# Patient Record
Sex: Male | Born: 1935 | Race: White | Hispanic: No | Marital: Married | Smoking: Current every day smoker
Health system: Southern US, Community
[De-identification: ages and names within clinical notes are randomized; demographics above are authoritative.]

## PROBLEM LIST (undated history)

## (undated) DIAGNOSIS — I82409 Acute embolism and thrombosis of unspecified deep veins of unspecified lower extremity: Secondary | ICD-10-CM

## (undated) DIAGNOSIS — I2119 ST elevation (STEMI) myocardial infarction involving other coronary artery of inferior wall: Secondary | ICD-10-CM

## (undated) DIAGNOSIS — I5030 Unspecified diastolic (congestive) heart failure: Secondary | ICD-10-CM

## (undated) DIAGNOSIS — D126 Benign neoplasm of colon, unspecified: Secondary | ICD-10-CM

## (undated) DIAGNOSIS — E119 Type 2 diabetes mellitus without complications: Secondary | ICD-10-CM

## (undated) DIAGNOSIS — Z720 Tobacco use: Secondary | ICD-10-CM

## (undated) DIAGNOSIS — N183 Chronic kidney disease, stage 3 unspecified: Secondary | ICD-10-CM

## (undated) DIAGNOSIS — I10 Essential (primary) hypertension: Secondary | ICD-10-CM

## (undated) DIAGNOSIS — IMO0002 Reserved for concepts with insufficient information to code with codable children: Secondary | ICD-10-CM

## (undated) DIAGNOSIS — R739 Hyperglycemia, unspecified: Secondary | ICD-10-CM

## (undated) DIAGNOSIS — F1920 Other psychoactive substance dependence, uncomplicated: Secondary | ICD-10-CM

## (undated) DIAGNOSIS — I442 Atrioventricular block, complete: Secondary | ICD-10-CM

## (undated) DIAGNOSIS — M1990 Unspecified osteoarthritis, unspecified site: Secondary | ICD-10-CM

## (undated) DIAGNOSIS — E785 Hyperlipidemia, unspecified: Secondary | ICD-10-CM

## (undated) DIAGNOSIS — J449 Chronic obstructive pulmonary disease, unspecified: Secondary | ICD-10-CM

## (undated) HISTORY — DX: Hyperglycemia, unspecified: R73.9

## (undated) HISTORY — DX: Other psychoactive substance dependence, uncomplicated: F19.20

## (undated) HISTORY — DX: Reserved for concepts with insufficient information to code with codable children: IMO0002

## (undated) HISTORY — DX: Chronic kidney disease, stage 3 (moderate): N18.3

## (undated) HISTORY — DX: Chronic kidney disease, stage 3 unspecified: N18.30

## (undated) HISTORY — DX: Tobacco use: Z72.0

## (undated) HISTORY — DX: Unspecified diastolic (congestive) heart failure: I50.30

## (undated) HISTORY — PX: OTHER SURGICAL HISTORY: SHX169

## (undated) HISTORY — DX: Essential (primary) hypertension: I10

## (undated) HISTORY — DX: Atrioventricular block, complete: I44.2

## (undated) HISTORY — PX: MELANOMA EXCISION: SHX5266

## (undated) HISTORY — DX: Benign neoplasm of colon, unspecified: D12.6

## (undated) HISTORY — DX: Unspecified osteoarthritis, unspecified site: M19.90

## (undated) HISTORY — DX: Hyperlipidemia, unspecified: E78.5

## (undated) HISTORY — DX: Type 2 diabetes mellitus without complications: E11.9

## (undated) HISTORY — DX: ST elevation (STEMI) myocardial infarction involving other coronary artery of inferior wall: I21.19

---

## 1998-06-27 ENCOUNTER — Observation Stay (HOSPITAL_COMMUNITY): Admission: RE | Admit: 1998-06-27 | Discharge: 1998-06-28 | Payer: Self-pay | Admitting: *Deleted

## 2000-01-14 ENCOUNTER — Encounter: Admission: RE | Admit: 2000-01-14 | Discharge: 2000-01-14 | Payer: Self-pay | Admitting: *Deleted

## 2000-01-14 ENCOUNTER — Encounter: Payer: Self-pay | Admitting: *Deleted

## 2002-01-18 DIAGNOSIS — D126 Benign neoplasm of colon, unspecified: Secondary | ICD-10-CM

## 2002-01-18 HISTORY — DX: Benign neoplasm of colon, unspecified: D12.6

## 2002-09-28 ENCOUNTER — Ambulatory Visit (HOSPITAL_COMMUNITY): Admission: RE | Admit: 2002-09-28 | Discharge: 2002-09-28 | Payer: Self-pay | Admitting: Cardiovascular Disease

## 2003-11-08 ENCOUNTER — Emergency Department (HOSPITAL_COMMUNITY): Admission: EM | Admit: 2003-11-08 | Discharge: 2003-11-08 | Payer: Self-pay | Admitting: Emergency Medicine

## 2005-02-08 ENCOUNTER — Ambulatory Visit: Payer: Self-pay | Admitting: Gastroenterology

## 2005-03-10 ENCOUNTER — Ambulatory Visit: Payer: Self-pay | Admitting: Gastroenterology

## 2005-03-10 ENCOUNTER — Encounter (INDEPENDENT_AMBULATORY_CARE_PROVIDER_SITE_OTHER): Payer: Self-pay | Admitting: Specialist

## 2005-10-04 ENCOUNTER — Encounter: Admission: RE | Admit: 2005-10-04 | Discharge: 2005-10-04 | Payer: Self-pay | Admitting: Internal Medicine

## 2007-03-20 ENCOUNTER — Ambulatory Visit (HOSPITAL_BASED_OUTPATIENT_CLINIC_OR_DEPARTMENT_OTHER): Admission: RE | Admit: 2007-03-20 | Discharge: 2007-03-20 | Payer: Self-pay | Admitting: Specialist

## 2010-02-12 ENCOUNTER — Encounter: Payer: Self-pay | Admitting: Gastroenterology

## 2010-02-19 NOTE — Letter (Signed)
Summary: Colonoscopy Letter  [facility]  [address] [place], [place] [address]   Phone: [phone]  Fax: [phone]      February 12, 2010 MRN: [id number]   Lance Smith [address] [place], [place]  [address]   Dear [name],   According to your medical record, it is time for you to schedule a Colonoscopy. The American Cancer Society recommends this procedure as a method to detect early colon cancer. Patients with a family history of colon cancer, or a personal history of colon polyps or inflammatory bowel disease are at increased risk.  This letter has been generated based on the recommendations made at the time of your procedure. If you feel that in your particular situation this may no longer apply, please contact our office.  Please call our office at [phone] to schedule this appointment or to update your records at your earliest convenience.  Thank you for cooperating with [place] to provide you with the very best care possible.   Sincerely,  Judie Petit T. Russella Dar, M.D.  [facility] [phone]

## 2010-06-02 NOTE — Op Note (Signed)
NAMEBRAETON, Lance Smith NO.:  [id number]   MEDICAL RECORD NO.:  [id number]          PATIENT TYPE:  AMB   LOCATION:  [facility]                         FACILITY:  [facility]   PHYSICIAN:  Jene Every, M.D.    DATE OF BIRTH:  Jun 02, 1931   DATE OF PROCEDURE:  03/20/2007  DATE OF DISCHARGE:                               OPERATIVE REPORT   PREOPERATIVE DIAGNOSIS:  Degenerative joint disease, medial and lateral  meniscus tear to the right knee.   POSTOPERATIVE DIAGNOSIS:  Degenerative joint disease, medial and lateral  meniscus tear to the right knee, grade 4 changes to the medial and  lateral compartment.   PROCEDURE PERFORMED:  1. Right knee arthroscopy.  2. Partial medial and lateral meniscectomy.  3. Chondroplasty of the patella, medial and lateral femoral condyles.   ANESTHESIA:  General.   ASSISTANT:  None.   INDICATION:  This is a 77 year old male with refractory knee pain and  degenerative changes of all three compartments.  Had MRI indicating  medial and lateral meniscus tear and tricompartment degenerative  changes.  He had been refractory to conservative treatment including  __________ steroid injection, activity modification.  He came for  possible arthroscopic debridement with the risks and benefits discussed  including bleeding, infection, damage to neurovascular systems, no  change in symptoms, worsening in symptoms, need for repeat debridement,  total knee arthroplasty in the future, anesthetic complications, DVT,  PE, etc.   TECHNIQUE:  The patient was in the supine position.  After the induction  of general anesthesia and 1 g of Kefzol, the right lower extremity was  prepped and draped in the usual sterile fashion.  The lateral  parapatellar portal and the superomedial parapatellar portal was  fashioned with a #11 blade.  Ingress cannula atraumatically placed.  Irrigant was utilized to insufflate the joint.  Under direct  visualization, a medial  parapatellar portal was fashioned with a #11  blade after localization with the 18-gauge needle sparing the medial  meniscus.  There was complex tearing of the medial meniscus.  Basket  rongeurs were introduced and utilized to perform partial medial  meniscectomy to a stable base.  There was a flip portion on the anterior  surface that was resected as well.  The remnant was further contoured  with the 4-2 Cuda shaver.  There were some grade 4 degenerative changes  of the medial compartment predominantly over the tibial plateau and a  portion on the femoral condyle.   Next, in the lateral compartment, there was some degenerative tearing in  the lateral compartment, and shavers were introduced and utilized to  perform shaving and partial medial meniscectomy to the stable base.  Debrided the femoral condyle and the tibial plateau as well as  performing chondroplasty here.  There was some degenerative changes and  tearing of the ACL.  No frank instability.  Gutters are unremarkable.  Chondroplasty of the patella was performed as well.  A remnant of all  menisci were stable to palpation.  Good flexion and extension.  No  residual pathology amenable to arthroscopic intervention.  Knee  copiously lavaged.  All instrumentation was removed.  The portals were  closed with 4-0 nylon simple sutures.  1/4% Marcaine with epinephrine  was infiltrated in the joint.  Wounds were dressed sterilely.  He was awakened without difficulty and  transported to the recovery room in satisfactory condition.   Patient tolerated the procedure well.  No complications.  No assistants.      Jene Every, M.D.  Electronically Signed     JB/MEDQ  D:  03/20/2007  T:  03/20/2007  Job:  [id number]

## 2010-06-05 NOTE — Consult Note (Signed)
NAME:  Lance Smith, Lance Smith NO.:  [id number]   MEDICAL RECORD NO.:  [id number]                   PATIENT TYPE:  OIB   LOCATION:                                       FACILITY:  [facility]   PHYSICIAN:  Vesta Mixer, M.D.              DATE OF BIRTH:  [birth year]   DATE OF CONSULTATION:  09/19/2002  DATE OF DISCHARGE:                                   CONSULTATION   Lance Smith is a middle-aged gentleman with a history of chest pain in the  past.  He has had an abnormal stress Cardiolite study in the past and is  referred now after having a repeat abnormal stress Cardiolite study.   Lance Smith has a long history of cigarette smoking.  He has had episodes of  chest pain over the past several years.  He apparently had a heart  catheterization many years ago which was unremarkable.  He had a stress  Cardiolite study performed one year ago which revealed mildly to moderately  depressed left ventricular systolic function with an ejection fraction of  45%.  This year he had a repeat stress Cardiolite study which revealed LVEF  of 44% as well as inferior wall ischemia or infarction.  He is now referred  for heart catheterization for further evaluation.   Lance Smith has not had any episodes of chest pain.  He does have occasional  episodes of shortness of breath.  He does complain of frequent episodes of  tachy palpitations.   CURRENT MEDICATIONS:  None.   ALLERGIES:  He is intolerant to ASPIRIN and PENICILLIN.   PAST MEDICAL HISTORY:  History of chest pain.   SOCIAL HISTORY:  The patient smokes at least a pack of cigarettes a day.  He  works as a Product/process development scientist.   FAMILY HISTORY:  Essentially negative.   REVIEW OF SYSTEMS:  Was reviewed and is essentially negative.   PHYSICAL EXAMINATION:  GENERAL:  A middle-aged gentleman in no acute  distress.  He is alert and oriented x 3, and his mood and affect are normal.  VITAL SIGNS:  Weight 176.  Blood pressure  116/70 with a heart rate of 68.  NECK:  Exam reveals 2+ carotids.  He has no bruits.  There is no JVD, no  thyromegaly.  LUNGS:  Clear to auscultation.  HEART:  Regular rate.  S1, S2 with no murmurs, gallops, or rubs.  ABDOMEN:  Good bowel sounds and nontender.  EXTREMITIES:  No clubbing, cyanosis, or edema.  NEUROLOGIC:  Nonfocal.   ASSESSMENT:  Lance Smith presents with some rather atypical episodes of chest  pain that may or may not be due to angina.  He has had an abnormal stress  Cardiolite study for the past several years.  His Cardiolite now reveals an  inferior wall defect.  I have recommended that we proceed with heart  catheterization.  We have discussed the risks, benefits, and options of  heart catheterization, and he agrees to proceed.   He currently smokes at least two packs of cigarettes a day.  I have asked  him to cut back on the cigarette smoking and to hopefully quit.  We will see  him in a week or so for his heart catheterization.                                               Vesta Mixer, M.D.    PJN/MEDQ  D:  09/19/2002  T:  09/20/2002  Job:  [id number]   cc:   Geoffry Paradise, M.D.  [address]  [place]  [place] [address]  Fax: [phone]

## 2010-08-06 ENCOUNTER — Ambulatory Visit (AMBULATORY_SURGERY_CENTER): Payer: Medicare Other | Admitting: *Deleted

## 2010-08-06 VITALS — Ht 69.0 in | Wt 178.3 lb

## 2010-08-06 DIAGNOSIS — Z860100 Personal history of colon polyps, unspecified: Secondary | ICD-10-CM

## 2010-08-06 MED ORDER — PEG-KCL-NACL-NASULF-NA ASC-C 100 G PO SOLR: ORAL | Status: DC

## 2010-08-19 ENCOUNTER — Encounter: Payer: Self-pay | Admitting: Gastroenterology

## 2010-08-19 ENCOUNTER — Ambulatory Visit (AMBULATORY_SURGERY_CENTER): Payer: Medicare Other | Admitting: Gastroenterology

## 2010-08-19 DIAGNOSIS — Z1211 Encounter for screening for malignant neoplasm of colon: Secondary | ICD-10-CM

## 2010-08-19 DIAGNOSIS — D126 Benign neoplasm of colon, unspecified: Secondary | ICD-10-CM

## 2010-08-19 DIAGNOSIS — Z86010 Personal history of colonic polyps: Secondary | ICD-10-CM

## 2010-08-19 DIAGNOSIS — K5730 Diverticulosis of large intestine without perforation or abscess without bleeding: Secondary | ICD-10-CM

## 2010-08-19 MED ORDER — SODIUM CHLORIDE 0.9 % IV SOLN: 500.0000 mL | INTRAVENOUS | Status: DC

## 2010-08-19 NOTE — Patient Instructions (Signed)
FOLLOW BLUE & GREEN DISCHARGE INSTRUCTIONS GIVEN   INFORMATION ON POLYPS, DIVERTICULOSIS, HEMORRHOIDS, & HIGH FIBER DIET GIVEN  RECOMMEND REPEATING COLONOSCOPY IN ONE YEAR

## 2010-08-20 ENCOUNTER — Telehealth: Payer: Self-pay

## 2010-08-20 NOTE — Telephone Encounter (Signed)

## 2010-08-29 ENCOUNTER — Encounter: Payer: Self-pay | Admitting: Gastroenterology

## 2010-09-09 ENCOUNTER — Ambulatory Visit (INDEPENDENT_AMBULATORY_CARE_PROVIDER_SITE_OTHER): Payer: Medicare Other

## 2010-09-09 DIAGNOSIS — M79609 Pain in unspecified limb: Secondary | ICD-10-CM

## 2010-09-09 DIAGNOSIS — M7989 Other specified soft tissue disorders: Secondary | ICD-10-CM

## 2010-09-09 DIAGNOSIS — R683 Clubbing of fingers: Secondary | ICD-10-CM

## 2010-09-15 NOTE — Procedures (Unsigned)
DUPLEX DEEP VENOUS EXAM - LOWER EXTREMITY  INDICATION:  Pain and swelling in left lower extremity for 5 days.  HISTORY:  Edema:  Yes Trauma/Surgery:  No Pain:  Yes PE:  No Previous DVT:  No Anticoagulants:  No Other:  DUPLEX EXAM:               CFV               SFV   PopV  PTV   GSV               R        L        R  L  R  L  R  L  R  L Thrombosis    o        +           o     +     +     o Spontaneous   +        o           o     o     o     + Phasic        +        o           o     o     o     + Augmentation  Not performed     Not performed Compressible  +        o           +     o     o     + Competent  Legend:  + - yes  o - no  p - partial  D - decreased  IMPRESSION: 1. There is evidence of acute deep vein thrombosis in the common     femoral, popliteal, peroneal, and posterior tibial veins. 2. Competency and augmentations were not performed due to presence of     thrombus. 3. Results were communicated to Diane at [facility]     on 09/09/2010.  The patient was instructed to return to that     office.   _____________________________ Quita Skye. Hart Rochester, M.D.  LT/MEDQ  D:  09/10/2010  T:  09/10/2010  Job:  [id number]

## 2010-09-23 ENCOUNTER — Other Ambulatory Visit: Payer: Self-pay | Admitting: Cardiovascular Disease

## 2010-09-23 NOTE — Telephone Encounter (Signed)
Spoke with wife and they see dr Jacky Kindle and will have him refill, dr Jacky Kindle sends him to dr Ian Bushman when needed. Pt not seen since 04/2008.  The wife will request aronson refill.  verbalized understanding. Alfonso Ramus RN

## 2010-10-12 LAB — POCT HEMOGLOBIN-HEMACUE
Hemoglobin: 16.5
Operator id: 133231

## 2011-05-19 HISTORY — PX: PACEMAKER INSERTION: SHX728

## 2011-05-20 ENCOUNTER — Encounter (HOSPITAL_COMMUNITY): Payer: Self-pay | Admitting: *Deleted

## 2011-05-20 ENCOUNTER — Emergency Department (HOSPITAL_COMMUNITY): Payer: Medicare Other

## 2011-05-20 ENCOUNTER — Encounter (HOSPITAL_COMMUNITY)
Admission: EM | Disposition: A | Discharge status: Home / Self Care | Payer: Self-pay | Source: Home / Self Care | Attending: Cardiovascular Disease

## 2011-05-20 ENCOUNTER — Ambulatory Visit (HOSPITAL_COMMUNITY): Admit: 2011-05-20 | Payer: Self-pay | Admitting: Internal Medicine

## 2011-05-20 ENCOUNTER — Inpatient Hospital Stay (HOSPITAL_COMMUNITY)
Admission: EM | Admit: 2011-05-20 | Discharge: 2011-05-24 | DRG: 242 | Disposition: A | Discharge status: Home / Self Care | Payer: Medicare Other | Attending: Cardiovascular Disease | Admitting: Cardiovascular Disease

## 2011-05-20 DIAGNOSIS — Z86010 Personal history of colonic polyps: Secondary | ICD-10-CM

## 2011-05-20 DIAGNOSIS — Z950 Presence of cardiac pacemaker: Secondary | ICD-10-CM

## 2011-05-20 DIAGNOSIS — R339 Retention of urine, unspecified: Secondary | ICD-10-CM | POA: Diagnosis present

## 2011-05-20 DIAGNOSIS — K5900 Constipation, unspecified: Secondary | ICD-10-CM | POA: Diagnosis not present

## 2011-05-20 DIAGNOSIS — I498 Other specified cardiac arrhythmias: Secondary | ICD-10-CM | POA: Diagnosis present

## 2011-05-20 DIAGNOSIS — I442 Atrioventricular block, complete: Secondary | ICD-10-CM

## 2011-05-20 DIAGNOSIS — R739 Hyperglycemia, unspecified: Secondary | ICD-10-CM | POA: Diagnosis present

## 2011-05-20 DIAGNOSIS — F17200 Nicotine dependence, unspecified, uncomplicated: Secondary | ICD-10-CM | POA: Diagnosis present

## 2011-05-20 DIAGNOSIS — IMO0002 Reserved for concepts with insufficient information to code with codable children: Secondary | ICD-10-CM

## 2011-05-20 DIAGNOSIS — R7989 Other specified abnormal findings of blood chemistry: Secondary | ICD-10-CM

## 2011-05-20 DIAGNOSIS — Z1211 Encounter for screening for malignant neoplasm of colon: Secondary | ICD-10-CM

## 2011-05-20 DIAGNOSIS — Z720 Tobacco use: Secondary | ICD-10-CM | POA: Diagnosis present

## 2011-05-20 DIAGNOSIS — J449 Chronic obstructive pulmonary disease, unspecified: Secondary | ICD-10-CM | POA: Diagnosis present

## 2011-05-20 DIAGNOSIS — I5023 Acute on chronic systolic (congestive) heart failure: Secondary | ICD-10-CM | POA: Diagnosis present

## 2011-05-20 DIAGNOSIS — M129 Arthropathy, unspecified: Secondary | ICD-10-CM | POA: Diagnosis present

## 2011-05-20 DIAGNOSIS — I509 Heart failure, unspecified: Secondary | ICD-10-CM | POA: Diagnosis present

## 2011-05-20 DIAGNOSIS — F1920 Other psychoactive substance dependence, uncomplicated: Secondary | ICD-10-CM

## 2011-05-20 DIAGNOSIS — J4489 Other specified chronic obstructive pulmonary disease: Secondary | ICD-10-CM | POA: Diagnosis present

## 2011-05-20 HISTORY — PX: PERMANENT PACEMAKER INSERTION: SHX5480

## 2011-05-20 HISTORY — DX: Acute embolism and thrombosis of unspecified deep veins of unspecified lower extremity: I82.409

## 2011-05-20 HISTORY — DX: Chronic obstructive pulmonary disease, unspecified: J44.9

## 2011-05-20 LAB — CBC
Hemoglobin: 14.3 g/dL (ref 13.0–17.0)
MCH: 31.5 pg (ref 26.0–34.0)
MCHC: 34 g/dL (ref 30.0–36.0)
MCV: 92.5 fL (ref 78.0–100.0)
Platelets: 189 10*3/uL (ref 150–400)
RBC: 4.54 MIL/uL (ref 4.22–5.81)

## 2011-05-20 LAB — BASIC METABOLIC PANEL
CO2: 21 mEq/L (ref 19–32)
Calcium: 9.3 mg/dL (ref 8.4–10.5)
Creatinine, Ser: 1.3 mg/dL (ref 0.50–1.35)
GFR calc non Af Amer: 51 mL/min — ABNORMAL LOW (ref >90)
Glucose, Bld: 159 mg/dL — ABNORMAL HIGH (ref 70–99)

## 2011-05-20 LAB — CARDIAC PANEL(CRET KIN+CKTOT+MB+TROPI)
CK, MB: 3 ng/mL (ref 0.3–4.0)
Troponin I: <0.3 ng/mL (ref <0.30)

## 2011-05-20 SURGERY — PERMANENT PACEMAKER INSERTION
Anesthesia: Moderate Sedation | Laterality: Left

## 2011-05-20 SURGERY — PERMANENT PACEMAKER INSERTION
Anesthesia: LOCAL

## 2011-05-20 MED ORDER — SODIUM CHLORIDE 0.9 % IV SOLN: INTRAVENOUS | Status: AC

## 2011-05-20 MED ORDER — ATROPINE SULFATE 1 MG/ML IJ SOLN
1.0000 mg | Freq: Once | INTRAMUSCULAR | Status: AC
  Administered 2011-05-20: 1 mg via INTRAVENOUS
  Filled 2011-05-20: qty 1

## 2011-05-20 MED ORDER — MIDAZOLAM HCL 2 MG/2ML IJ SOLN
INTRAMUSCULAR | Status: AC
  Filled 2011-05-20: qty 2

## 2011-05-20 MED ORDER — FENTANYL CITRATE 0.05 MG/ML IJ SOLN
INTRAMUSCULAR | Status: AC
  Filled 2011-05-20: qty 2

## 2011-05-20 MED ORDER — SIMVASTATIN 40 MG PO TABS
40.0000 mg | ORAL_TABLET | Freq: Every day | ORAL | Status: DC
  Administered 2011-05-21 – 2011-05-23 (×3): 40 mg via ORAL
  Filled 2011-05-20 (×4): qty 1

## 2011-05-20 MED ORDER — VANCOMYCIN HCL IN DEXTROSE 1-5 GM/200ML-% IV SOLN
1000.0000 mg | INTRAVENOUS | Status: DC
  Filled 2011-05-20: qty 200

## 2011-05-20 MED ORDER — ACETAMINOPHEN 325 MG PO TABS
325.0000 mg | ORAL_TABLET | ORAL | Status: DC | PRN
  Administered 2011-05-21 – 2011-05-24 (×5): 650 mg via ORAL
  Filled 2011-05-20 (×5): qty 2

## 2011-05-20 MED ORDER — SODIUM CHLORIDE 0.9 % IV SOLN: 500.0000 mL | INTRAVENOUS | Status: DC

## 2011-05-20 MED ORDER — PREDNISONE 10 MG PO TABS
10.0000 mg | ORAL_TABLET | Freq: Every day | ORAL | Status: DC
  Administered 2011-05-21 – 2011-05-24 (×4): 10 mg via ORAL
  Filled 2011-05-20 (×5): qty 1

## 2011-05-20 MED ORDER — ONDANSETRON HCL 4 MG/2ML IJ SOLN: 4.0000 mg | Freq: Four times a day (QID) | INTRAMUSCULAR | Status: DC | PRN

## 2011-05-20 MED ORDER — SODIUM CHLORIDE 0.9 % IR SOLN
80.0000 mg | Status: DC
  Filled 2011-05-20: qty 2

## 2011-05-20 MED ORDER — LIDOCAINE HCL (PF) 1 % IJ SOLN
INTRAMUSCULAR | Status: AC
  Filled 2011-05-20: qty 30

## 2011-05-20 MED ORDER — VANCOMYCIN HCL IN DEXTROSE 1-5 GM/200ML-% IV SOLN
1000.0000 mg | Freq: Two times a day (BID) | INTRAVENOUS | Status: AC
  Administered 2011-05-20: 1000 mg via INTRAVENOUS
  Filled 2011-05-20: qty 200

## 2011-05-20 MED ORDER — LIDOCAINE HCL (PF) 1 % IJ SOLN
INTRAMUSCULAR | Status: AC
  Filled 2011-05-20: qty 60

## 2011-05-20 MED ORDER — NITROGLYCERIN 0.4 MG SL SUBL: 0.4000 mg | SUBLINGUAL_TABLET | SUBLINGUAL | Status: DC | PRN

## 2011-05-20 NOTE — ED Provider Notes (Signed)
History     CSN: [id number]  Arrival date & time 05/20/11  1525   First MD Initiated Contact with Patient 05/20/11 1540      Chief Complaint  Patient presents with  . Bradycardia  . Chest Pain    (Consider location/radiation/quality/duration/timing/severity/associated sxs/prior treatment) HPI Comments: Patient arrives from home with "slow heart beat". He reports having intermittent dizziness and lightheadedness for the past 4 days. He measured his heart rate on today and was in the 30s. Slight bit her yesterday was in the 40s. He did not come in because he thought it would get better. He denies any chest pain but has had some tightness and difficulty breathing. He denies any chest pain or shortness of breath right now. He denies any previous cardiac history but has seen Dr. Elease Hashimoto in the past.  The history is provided by the patient.    Past Medical History  Diagnosis Date  . Arthritis   . Hyperlipidemia   . DVT (deep venous thrombosis), unspecified laterality   . Colon polyp     Past Surgical History  Procedure Date  . Bilateral inguinal hernia     No family history on file.  History  Substance Use Topics  . Smoking status: Current Everyday Smoker -- 2.0 packs/day    Types: Cigarettes  . Smokeless tobacco: Not on file  . Alcohol Use: No      Review of Systems  Constitutional: Negative for fever, activity change and appetite change.  HENT: Negative for congestion and rhinorrhea.   Eyes: Negative for photophobia.  Cardiovascular: Positive for chest pain.  Gastrointestinal: Negative for nausea, vomiting and abdominal pain.  Genitourinary: Negative for dysuria and hematuria.  Musculoskeletal: Negative for back pain.  Skin: Negative for rash.  Neurological: Positive for dizziness and light-headedness. Negative for headaches.    Allergies  Aspirin; Tetanus toxoids; and Penicillins  Home Medications   Current Outpatient Rx  Name Route Sig Dispense Refill  .  NITROGLYCERIN 0.4 MG SL SUBL Sublingual Place 0.4 mg under the tongue every 5 (five) minutes as needed. For chest pain    . PREDNISONE 10 MG PO TABS Oral Take 10 mg by mouth every morning.     Marland Kitchen SIMVASTATIN 40 MG PO TABS Oral Take 40 mg by mouth every morning.       BP 127/53  Pulse 38  Resp 16  SpO2 98%  Physical Exam  Constitutional: He is oriented to person, place, and time. He appears well-developed and well-nourished. No distress.  HENT:  Head: Normocephalic and atraumatic.  Mouth/Throat: Oropharynx is clear and moist. No oropharyngeal exudate.  Eyes: Conjunctivae are normal. Pupils are equal, round, and reactive to light.  Neck: Normal range of motion. Neck supple.  Cardiovascular: Normal rate and normal heart sounds.   No murmur heard.      Irregular rhythm  Pulmonary/Chest: Effort normal and breath sounds normal. No respiratory distress.  Abdominal: Soft. There is no tenderness. There is no rebound and no guarding.  Musculoskeletal: Normal range of motion. He exhibits no edema and no tenderness.  Neurological: He is alert and oriented to person, place, and time. No cranial nerve deficit.  Skin: Skin is warm.    ED Course  Procedures (including critical care time)  Labs Reviewed  CBC - Abnormal; Notable for the following:    WBC 14.6 (*)    All other components within normal limits  BASIC METABOLIC PANEL - Abnormal; Notable for the following:    Glucose, Bld  159 (*)    GFR calc non Af Amer 51 (*)    GFR calc Af Amer 59 (*)    All other components within normal limits  PRO B NATRIURETIC PEPTIDE - Abnormal; Notable for the following:    Pro B Natriuretic peptide (BNP) 4782.0 (*)    All other components within normal limits  PROTIME-INR  CARDIAC PANEL(CRET KIN+CKTOT+MB+TROPI)   No results found.   No diagnosis found.    MDM  Dizziness, shortness of breath, chest tightness and bradycardia. EKG shows complete heart block. Patient is awake and alert no distress.  His complains of dizziness and lightheadedness. He is  maintaining his blood pressure and mentation.  Symptoms ongoing x 3 days.  Pacer pads placed on patient.  D/w cardiology.  To cath lab for permanent pacer.   Date: 05/20/2011  Rate: 36  Rhythm: normal sinus rhythm  QRS Axis: left  Intervals: normal  ST/T Wave abnormalities: nonspecific ST/T changes  Conduction Disutrbances:complete heart block  Narrative Interpretation:   Old EKG Reviewed: changes noted  CRITICAL CARE Performed by: Glynn Octave   Total critical care time: 40  Critical care time was exclusive of separately billable procedures and treating other patients.  Critical care was necessary to treat or prevent imminent or life-threatening deterioration.  Critical care was time spent personally by me on the following activities: development of treatment plan with patient and/or surrogate as well as nursing, discussions with consultants, evaluation of patient's response to treatment, examination of patient, obtaining history from patient or surrogate, ordering and performing treatments and interventions, ordering and review of laboratory studies, ordering and review of radiographic studies, pulse oximetry and re-evaluation of patient's condition.         Glynn Octave, MD 05/20/11 [phone]

## 2011-05-20 NOTE — ED Notes (Signed)
Onset 3 days ago shortness of breath and chest pressure.  Currently patient ax4 answering and following commands appropriate.  States [employer] today and states symptoms continued and checked heart rate 31 at home. Took one tab nitro SL. States denies any chest pain currently only shortness of breath.  Airway intact bilateral equal chest rise and fall.  Radial pulse +2 bilateral and pedal pulse +1 bilateral with edema +1

## 2011-05-20 NOTE — CV Procedure (Signed)
Preop DX::complete heart block Post op DX:: same  Procedure dual pacemaker implantation  After routine prep and drape, lidocaine was infiltrated in the prepectoral subclavicular region on the left side an incision was made and carried down to later the prepectoral fascia using electrocautery and sharp dissection a pocket was formed similarly. Hemostasis was obtained.  After this, we turned our attention to gaining accessm to the extrathoracic,left subclavian vein. This was accomplished without difficulty and without the aspiration of air or puncture of the artery. 2 separate venipunctures were accomplished; guidewires were placed and retained and sequentially 7 French sheath through which were  passed an [employer] 2088 ventricular lead serial [id number] and an [facility] 2088 atrial lead serial number [id number] .  The ventricular lead was manipulated to the right ventricular apex with a bipolar R wave was9.3, the pacing impedance was 781, the threshold was 1.2  @ 0.5 msec  Current at threshold was   1.9 and the current of injury was brisk.  The right atrial lead was manipulated to the right atrial appendage with a bipolar P-wave  3.6, the pacing impedance was 499, the threshold 0.9@ 0.5 msec   Current at threshold was1.8 ma and the current of injury was brisk.   The leads were affixed to the prepectoral fascia and attached to a st judes pulse generator serial number [id number].  Hemostasis was obtained. The pocket was copiously irrigated with antibiotic containing saline solution. The leads and the pulse generator were placed in the pocket and affixed to the prepectoral fascia. The wound is then closed in 3 layers in normal fashion. The wound is washed dried and a benzoin Steri-Strip this was applied the account sponge counts and instrument counts were correct at the end of the procedure .Marland Kitchen The patient tolerated the procedure without apparent complication.  Gerlene Burdock.D.

## 2011-05-20 NOTE — ED Notes (Signed)
Placed patient on zoll with pads on.

## 2011-05-20 NOTE — ED Notes (Signed)
Patient signed consent.

## 2011-05-20 NOTE — H&P (Signed)
History and Physical  Patient ID: [name] Patient ID: MANSA WILLERS MRN: [id number], DOB/AGE: 76/29/1933 76 y.o. Date of Encounter: 05/20/2011  Primary Physician: Minda Meo, MD, MD Primary Cardiologist: Nahser (2010) Chief Complaint:  CHB  HPI: Mr. Vandervliet is a 78 year old male with little previous cardiac history. He was in his usual state of health until this past Monday. He developed dyspnea on exertion that was significant as well as weakness. He had some mild chest pain. He began checking his heart rate and blood pressure. He noted his heart rate to be in the 40s on Tuesday and in the 30s on Wednesday and Thursday. Today his heart rate was as low as 31. His wife brought him to the emergency room and he was in complete heart block with a heart rate in the 30s, a ventricular escape rhythm. Currently, he complains of some weakness but is not short of breath at rest. He describes significant dyspnea with minimal exertion and states his activity level has been significantly decreased over the last few days. He continues to smoke heavily. He is not able to describe the chest pain very well but says it has been very mild.  Past Medical History  Diagnosis Date  . Arthritis   . Hyperlipidemia      Surgical History:  Past Surgical History  Procedure Date  . Bilateral inguinal hernia      I have reviewed the patient's current medications.  Scheduled Meds:   . atropine  1 mg Intravenous Once   Continuous Infusions:   . sodium chloride     PRN Meds:.nitroGLYCERIN Current Facility-Administered Medications on File Prior to Encounter  Medication Dose Route Frequency Provider Last Rate Last Dose  . DISCONTD: 0.9 %  sodium chloride infusion  500 mL Intravenous Continuous Meryl Dare, MD,FACG       Current Outpatient Prescriptions on File Prior to Encounter  Medication Sig Dispense Refill  . predniSONE (DELTASONE) 10 MG tablet Take 10 mg by mouth every morning.       .  simvastatin (ZOCOR) 40 MG tablet Take 40 mg by mouth every morning.       Marland Kitchen DISCONTD: NITROSTAT 0.4 MG SL tablet DISSOLVE ONE TABLET UNDER THE TONGUE EVERY 5 MINUTES AS NEEDED FOR CHEST PAIN.  DO NOT EXCEED 3 DOSES IN 15 MINUTES  25 each  0    Allergies:  Allergies  Allergen Reactions  . Aspirin Nausea Only and Other (See Comments)    headache  . Tetanus Toxoids Other (See Comments)    Unspecified reaction  . Penicillins Swelling    History   Social History  . Marital Status: Married    Spouse Name: N/A    Number of Children: N/A  . Years of Education: N/A   Occupational History  . Not on file.   Social History Main Topics  . Smoking status: Current Everyday Smoker -- 2.0 packs/day    Types: Cigarettes  . Smokeless tobacco: Not on file  . Alcohol Use: No  . Drug Use: No  . Sexually Active: Not on file   Other Topics Concern  . Not on file   Social History Narrative  . No narrative on file     Family history- both parents are deceased. Neither parent required a pacemaker and no siblings have pacemakers.  Review of Systems:  He has not had any illnesses, fevers or chills. He coughs on a regular basis. He denies wheezing. He has not had melena.  Full 14-point review of systems otherwise negative except as noted above.   Physical Exam: Blood pressure 122/49, pulse 36, resp. rate 18, SpO2 99.00%. General: Well developed, well nourished, male in no acute distress. Head: Normocephalic, atraumatic, sclera non-icteric, no xanthomas, nares are without discharge. Dentition: Poor Neck: No carotid bruits. JVD not elevated. No thyromegally Lungs: Good expansion bilaterally. without wheezes or rhonchi. Rales present in both bases Heart: IRRegular rate and rhythm with S1 S2.  No S3 or S4.  Soft murmur; no rubs, or gallops appreciated. Abdomen: Soft, non-tender, non-distended with normoactive bowel sounds. No hepatomegaly. No rebound/guarding. No obvious abdominal masses. Msk:   Strength and tone appear normal for age. No joint deformities or effusions, no spine or costo-vertebral angle tenderness. Extremities: No clubbing or cyanosis. No edema.  Distal pedal pulses are 2+ in 4 extrem Neuro: Alert and oriented X 3. Moves all extremities spontaneously. No focal deficits noted. Psych:  Responds to questions appropriately with a normal affect. Skin: No rashes or lesions noted  Labs: Lab Results  Component Value Date   WBC 14.6* 05/20/2011   HGB 14.3 05/20/2011   HCT 42.0 05/20/2011   MCV 92.5 05/20/2011   PLT 189 05/20/2011    Basename 05/20/11 1541  INR 1.00    Lab 05/20/11 1541  NA 139  K 3.9  CL 106  CO2 21  BUN 23  CREATININE 1.30  CALCIUM 9.3  PROT --  BILITOT --  ALKPHOS --  ALT --  AST --  GLUCOSE 159*    Basename 05/20/11 1540  CKTOTAL 60  CKMB 3.0  TROPONINI <0.30      Radiology/Studies: CXR pending   ECG: 20-May-2011 15:30:30  ** Critical Test Result: Arrhythmia , AV Block Sinus rhythm with complete heart block and Idioventricular rhythm Left axis deviation Left bundle branch block Vent. rate 36 BPM PR interval * ms QRS duration 154 ms QT/QTc 528/408 ms P-R-T axes 60 -57 96  ASSESSMENT AND PLAN:  Principal Problem:  *Complete heart block - Mr. Leyh was seen by Dr.Omarian Jaquith, the patient evaluated and the data reviewed. He has symptomatic complete heart block. A permanent pacemaker is indicated. The risks and benefits of a permanent pacemaker including, but not limited to, death, stroke, MI, kidney damage, pneumothorax and bleeding were discussed with the patient and his wife who indicate understanding and agree to proceed. He will be continued on home medications. We will check an echocardiogram. The pts troponin is normal, so it is not likely that this is acute MI as he dates the change in his HR to Monday.  The benefits and risks were reviewed including but not limited to death,  perforation, infection, lead dislodgement and device  malfunction.  The patient understands agrees and is willing to proceed.   Active Problems:  Tobacco abuse -  cessation encouraged  Signed, Theodore Demark PA-C 05/20/2011, 4:18 PM Kumari Sculley

## 2011-05-20 NOTE — ED Notes (Signed)
Cardiology at bedside.

## 2011-05-20 NOTE — ED Notes (Signed)
Pt is here for bradycardia.  Pt has been having dizziness and sob and some chest tightness.

## 2011-05-21 ENCOUNTER — Inpatient Hospital Stay (HOSPITAL_COMMUNITY): Payer: Medicare Other

## 2011-05-21 ENCOUNTER — Encounter: Payer: Self-pay | Admitting: *Deleted

## 2011-05-21 DIAGNOSIS — R339 Retention of urine, unspecified: Secondary | ICD-10-CM | POA: Diagnosis present

## 2011-05-21 DIAGNOSIS — Z950 Presence of cardiac pacemaker: Secondary | ICD-10-CM | POA: Insufficient documentation

## 2011-05-21 DIAGNOSIS — I517 Cardiomegaly: Secondary | ICD-10-CM

## 2011-05-21 DIAGNOSIS — I509 Heart failure, unspecified: Secondary | ICD-10-CM

## 2011-05-21 LAB — BASIC METABOLIC PANEL
BUN: 21 mg/dL (ref 6–23)
CO2: 25 mEq/L (ref 19–32)
Chloride: 102 mEq/L (ref 96–112)
GFR calc non Af Amer: 54 mL/min — ABNORMAL LOW (ref >90)
Glucose, Bld: 114 mg/dL — ABNORMAL HIGH (ref 70–99)
Potassium: 3.9 mEq/L (ref 3.5–5.1)

## 2011-05-21 LAB — CARDIAC PANEL(CRET KIN+CKTOT+MB+TROPI): CK, MB: 2.2 ng/mL (ref 0.3–4.0)

## 2011-05-21 MED ORDER — IPRATROPIUM BROMIDE 0.02 % IN SOLN
0.5000 mg | RESPIRATORY_TRACT | Status: DC | PRN
  Administered 2011-05-21 – 2011-05-23 (×2): 0.5 mg via RESPIRATORY_TRACT
  Filled 2011-05-21 (×2): qty 2.5

## 2011-05-21 MED ORDER — FUROSEMIDE 10 MG/ML IJ SOLN
40.0000 mg | Freq: Once | INTRAMUSCULAR | Status: AC
  Administered 2011-05-21: 40 mg via INTRAVENOUS
  Filled 2011-05-21: qty 4

## 2011-05-21 MED ORDER — FUROSEMIDE 40 MG PO TABS
40.0000 mg | ORAL_TABLET | Freq: Every day | ORAL | Status: DC
  Administered 2011-05-21 – 2011-05-24 (×4): 40 mg via ORAL
  Filled 2011-05-21 (×4): qty 1

## 2011-05-21 MED ORDER — YOU HAVE A PACEMAKER BOOK
Freq: Once | Status: AC
  Administered 2011-05-21: 18:00:00
  Filled 2011-05-21: qty 1

## 2011-05-21 MED ORDER — ALBUTEROL SULFATE (5 MG/ML) 0.5% IN NEBU
2.5000 mg | INHALATION_SOLUTION | RESPIRATORY_TRACT | Status: DC | PRN
  Administered 2011-05-21 – 2011-05-23 (×3): 2.5 mg via RESPIRATORY_TRACT
  Filled 2011-05-21 (×3): qty 0.5

## 2011-05-21 NOTE — Progress Notes (Signed)
Pt refused to wear sling,"im gonna keep my arm to the side" as verbalized.

## 2011-05-21 NOTE — Progress Notes (Signed)
Orthopedic Tech Progress Note Patient Details:  Lance Smith [birth year] [id number]  Patient ID: [name], male   DOB: 1931-02-11, 78 y.o.   MRN: [id number] Confirmed pt has arm sling.  Leo Grosser T 05/21/2011, 2:39 PM

## 2011-05-21 NOTE — Progress Notes (Signed)
Device interrogation performed this AM by industry shows normal PPM function with stable lead parameters. However, Lance Smith has no underlying/escape rhythm; therefore, we recommend he be watched closely on telemetry x48 hours post implant with possible DC on Sunday, 05/23/11.  [name],MD

## 2011-05-21 NOTE — Consult Note (Signed)
Pt smokes up to 2 ppd. Wants to quit and says he needs help with quitting. Recommended 21 mg patch in combination with the 4 mg committ lozenges to be used for helping him quit. He seems willing to try that and verbalizes understanding of the instructions and tapering that's explained to him. Referred to 1-800 quit now for f/u and support. Discussed oral fixation substitutes, second hand smoke and in home smoking policy. Reviewed and gave pt Written education/contact information.

## 2011-05-21 NOTE — Progress Notes (Signed)
INITIAL ADULT NUTRITION ASSESSMENT Date: 05/21/2011   Time: 11:55 AM Reason for Assessment: Nutrition risk, dysphagia   ASSESSMENT: Male 78 y.o.  Dx: Complete heart block  Hx:  Past Medical History  Diagnosis Date  . Arthritis   . Hyperlipidemia   . DVT (deep venous thrombosis), unspecified laterality   . Colon polyp   . COPD (chronic obstructive pulmonary disease)   . Shortness of breath     Related Meds:     . atropine  1 mg Intravenous Once  . furosemide  40 mg Intravenous Once  . furosemide  40 mg Oral Daily  . lidocaine      . lidocaine      . predniSONE  10 mg Oral Q breakfast  . simvastatin  40 mg Oral q1800  . vancomycin  1,000 mg Intravenous Q12H  . DISCONTD: gentamicin irrigation  80 mg Irrigation To Major  . DISCONTD: vancomycin  1,000 mg Intravenous On Call     Ht: 5\' 9"  (175.3 cm)  Wt: 187 lb 6.3 oz (85 kg)  Ideal Wt: 72.7 kg % Ideal Wt: 117%  Usual Wt:  Wt Readings from Last 5 Encounters:  05/20/11 187 lb 6.3 oz (85 kg)  05/20/11 187 lb 6.3 oz (85 kg)  05/20/11 187 lb 6.3 oz (85 kg)  08/19/10 180 lb (81.647 kg)  08/06/10 178 lb 4.8 oz (80.876 kg)    % Usual Wt:  >100%  Body mass index is 27.67 kg/(m^2). Over weight  Food/Nutrition Related Hx: Pt reports problems chewing r/t not having teeth or dentures   Labs:  CMP     Component Value Date/Time   NA 139 05/20/2011 1541   K 3.9 05/20/2011 1541   CL 106 05/20/2011 1541   CO2 21 05/20/2011 1541   GLUCOSE 159* 05/20/2011 1541   BUN 23 05/20/2011 1541   CREATININE 1.30 05/20/2011 1541   CALCIUM 9.3 05/20/2011 1541   GFRNONAA 51* 05/20/2011 1541   GFRAA 59* 05/20/2011 1541    Intake/Output Summary (Last 24 hours) at 05/21/11 1157 Last data filed at 05/21/11 1000  Gross per 24 hour  Intake    560 ml  Output   4675 ml  Net  -4115 ml     Diet Order: Heart  Supplements/Tube Feeding: none  IVF:    sodium chloride  sodium chloride    Estimated Nutritional Needs:   Kcal: 1900-2100 Protein:  95-105 gm  Fluid:  1.9-2.1 L  Pt states he has problems chewing r/t his teeth. Pt denied need for diet change at this time. Pt nutrition hx, no weight loss. No other nutrition problems at this time. No meals have been documented yet, pt states that he ordered fish for lunch and pot roast for dinner. Pt chooses easy to chew foods to compensate for missing teeth.   NUTRITION DIAGNOSIS: -Biting/Chewing (NI-1.2).  Status: Ongoing  RELATED TO: edentulous status  AS EVIDENCE BY: pt orders easy to chew foods  MONITORING/EVALUATION(Goals): Goal: PO intake will meet > 90% of estimated nutrition needs Monitor: PO intake, weight, labs, I/O's  EDUCATION NEEDS: -No education needs identified at this time  INTERVENTION: 1. Pt denies need for nutrition interventions at this time, RD will continue to follow for needs  Dietitian [phone]  DOCUMENTATION CODES Per approved criteria  -Not Applicable    Clarene Duke MARIE 05/21/2011, 11:55 AM

## 2011-05-21 NOTE — Progress Notes (Signed)
Pt. Has been voiding at frequent interval small amout of urine, pt. stated discomfort in his bladder and stated that he has hx. of prostate problem. Post void bladder was scanned and shows . of urine. Pt. also has  some episode of severe wheezing especially on exertion and with some scattered rhonchi. O2 sats. Stable. Dr.Mclean made aware with orders made. Marland Kitchen

## 2011-05-21 NOTE — Progress Notes (Signed)
Patient ID: Lance Smith, male   DOB: [birth year], 78 y.o.   MRN: [id number]   SUBJECTIVE:  The patient presented yesterday with complete heart block. He was taken to the lab and a permanent pacemaker was placed. He is on chronic steroids. This has been continued. He also tells me today he's had a history of intermittent bladder outlet obstruction. He had difficulties with urinary retention last night and a Foley was placed. He became short of breath and it became clear that he was volume overloaded. With one dose of Lasix he diuresis 4 L. His BNP on admission was 4000.. This morning he is feeling better. He has no chest pain. His shortness of breath is improving.   Filed Vitals:   05/21/11 0100 05/21/11 0300 05/21/11 0409 05/21/11 0500  BP: 129/62 135/58 126/69 126/62  Pulse: 61 59 71 73  Temp: 99.5 F (37.5 C)  99.4 F (37.4 C)   TempSrc: Oral     Resp: 20  22   Height:      Weight:      SpO2: 96% 94% 93% 92%    Intake/Output Summary (Last 24 hours) at 05/21/11 0741 Last data filed at 05/21/11 0500  Gross per 24 hour  Intake    360 ml  Output   4425 ml  Net  -4065 ml    LABS: Basic Metabolic Panel:  Basename 05/20/11 1541  NA 139  K 3.9  CL 106  CO2 21  GLUCOSE 159*  BUN 23  CREATININE 1.30  CALCIUM 9.3  MG --  PHOS --   Liver Function Tests: No results found for this basename: AST:2,ALT:2,ALKPHOS:2,BILITOT:2,PROT:2,ALBUMIN:2 in the last 72 hours No results found for this basename: LIPASE:2,AMYLASE:2 in the last 72 hours CBC:  Basename 05/20/11 1541  WBC 14.6*  NEUTROABS --  HGB 14.3  HCT 42.0  MCV 92.5  PLT 189   Cardiac Enzymes:  Basename 05/20/11 1540  CKTOTAL 60  CKMB 3.0  CKMBINDEX --  TROPONINI <0.30   BNP: No components found with this basename: POCBNP:3 D-Dimer: No results found for this basename: DDIMER:2 in the last 72 hours Hemoglobin A1C: No results found for this basename: HGBA1C in the last 72 hours Fasting Lipid Panel: No results  found for this basename: CHOL,HDL,LDLCALC,TRIG,CHOLHDL,LDLDIRECT in the last 72 hours Thyroid Function Tests: No results found for this basename: TSH,T4TOTAL,FREET3,T3FREE,THYROIDAB in the last 72 hours  RADIOLOGY: Dg Chest Portable 1 View  05/20/2011  *RADIOLOGY REPORT*  Clinical Data: Chest pain.  Bradycardia.  Preop respiratory exam for pacemaker.  PORTABLE CHEST - 1 VIEW  Comparison: 03/27/2007  Findings: Mild cardiomegaly and pulmonary vascular congestion again demonstrated, consistent with pulmonary venous hypertension.  No evidence of frank pulmonary edema.  No evidence of pulmonary consolidation or effusion.  IMPRESSION: Stable cardiomegaly and chronic pulmonary venous hypertension.  No acute findings.  Original Report Authenticated By: Danae Orleans, M.D.    PHYSICAL EXAM   Patient is comfortable today sitting up in a chair. He is oriented to person time and place. Affect is normal. There is no jugulovenous distention. Lungs reveal basilar rales with a few rhonchi and wheezes. Cardiac exam reveals S1 and S2. There no clicks or significant murmurs. The abdomen is soft. There is no significant peripheral edema. There are no skin rashes. The pacemaker site is stable. There is no marked hematoma.   TELEMETRY: I have reviewed telemetry. The rhythm is paced.   ASSESSMENT AND Plan:    *Complete heart block  Patient  received a permanent pacemaker yesterday done emergently. He is doing well with this. Chest x-ray from this morning is pending.   Tobacco abuse  I have ordered a smoking cessation consult   Elevated brain natriuretic peptide (BNP) level  CHF (congestive heart failure)   The patient's BNP is elevated on admission. In addition he became short of breath last evening it became clear he was volume overloaded. He was given IV Lasix and diuresis well. I've started him on oral Lasix. I do not think he was on Lasix on a regular basis before admission. Some of the issues may have been  related to his marked bradycardia. Two-dimensional echo will be done to further assess LV function. Repeat BNP has been ordered. We will follow his electrolytes carefully. His volumes status will have to be stabilized before he can go home.   Urinary retention  Patient required that a Foley be placed last night. He tells me today he's had problems with urinary retention in the past. He has a urologist in [facility]. I will ask our team to be in touch with his urologist to make a plan. He tells me that he is gone home with a Foley in the past. We may have to use this approach on this admission also.   Steroid dependence   The patient is on daily prednisone. I do not have the background for the use of this drug. I am assuming is pulmonary. It has been continued at his usual home dose.  Willa Rough 05/21/2011 7:41 AM

## 2011-05-21 NOTE — Progress Notes (Signed)
  Echocardiogram 2D Echocardiogram has been performed.  Lance Smith Hon 05/21/2011, 9:31 AM 

## 2011-05-21 NOTE — Progress Notes (Signed)
Called pt urologist, Dr Gerome Sam, in [facility]. Updated him on Lance Smith's situation. He recommended pt keep foley in place and follow up in the office on Monday. Info put on d/c instructions.

## 2011-05-22 LAB — BASIC METABOLIC PANEL
BUN: 20 mg/dL (ref 6–23)
CO2: 28 mEq/L (ref 19–32)
Chloride: 99 mEq/L (ref 96–112)
Creatinine, Ser: 1.14 mg/dL (ref 0.50–1.35)
Glucose, Bld: 181 mg/dL — ABNORMAL HIGH (ref 70–99)
Potassium: 3.8 mEq/L (ref 3.5–5.1)

## 2011-05-22 MED ORDER — ALUM & MAG HYDROXIDE-SIMETH 200-200-20 MG/5ML PO SUSP
30.0000 mL | Freq: Once | ORAL | Status: AC
  Administered 2011-05-22: 30 mL via ORAL

## 2011-05-22 MED ORDER — POLYETHYLENE GLYCOL 3350 17 G PO PACK
17.0000 g | PACK | Freq: Every day | ORAL | Status: DC
  Administered 2011-05-22 – 2011-05-24 (×2): 17 g via ORAL
  Filled 2011-05-22 (×3): qty 1

## 2011-05-22 NOTE — Progress Notes (Signed)
Lance Smith Internal Medicine Resident Note  Subjective:  Left chest sore today following pacer placement.  Denies shortness of breath, lightheadedness, or palpitations.  States he hasn't had a bowel movement since admission and usually goes once daily.  Does not normally take anything for his bowels.  Good urine output with oral lasix  Objective:  Vital Signs in the last 24 hours: Filed Vitals:   05/22/11 0300 05/22/11 0400 05/22/11 0700 05/22/11 0800  BP: 118/69 130/71 116/69   Pulse: 68 66 60 65  Temp:  98.5 F (36.9 C) 98.6 F (37 C)   TempSrc:  Oral Oral   Resp:      Height:      Weight:      SpO2: 96% 93% 94% 94%    Intake/Output from previous day: 05/03 0701 - 05/04 0700 In: 700 [P.O.:700] Out: 2050 [Urine:2050] Total I&O since admission 5.5L  24-hour weight change: Weight change:   Weight trends: Filed Weights   05/20/11 2129  Weight: 187 lb 6.3 oz (85 kg)   Physical Exam: Vitals reviewed. General: resting in bed, NAD, bandage in place over left chest with some blood shadowing.   HEENT: PERRL, EOMI, no scleral icterus Cardiac: RRR, no rubs, murmurs or gallops Pulm: clear to auscultation bilaterally, no wheezes, rales, or rhonchi Abd: soft, nontender, nondistended, BS present Ext: warm and well perfused, no pedal edema Neuro: alert and oriented X3, cranial nerves II-XII grossly intact, strength and sensation to light touch equal in bilateral upper and lower extremities  Lab Results:  Basename 05/20/11 1541  WBC 14.6*  HGB 14.3  PLT 189    Basename 05/22/11 0500 05/21/11 1135  NA 137 137  K 3.8 3.9  CL 99 102  CO2 28 25  GLUCOSE 181* 114*  BUN 20 21  CREATININE 1.14 1.25    Basename 05/21/11 1135 05/20/11 1540  TROPONINI <0.30 <0.30   No results found for this basename: GLUCAP:6 in the last 72 hours  Basename 05/21/11 1135 05/20/11 1540  PROBNP 3024.0* 4782.0*   Tele: 100% atrial paced per tele monitor.   Scheduled Meds:   .  furosemide  40 mg Oral Daily  . polyethylene glycol  17 g Oral Daily  . predniSONE  10 mg Oral Q breakfast  . simvastatin  40 mg Oral q1800  . you have a pacemaker book   Does not apply Once   Continuous Infusions:   . sodium chloride     PRN Meds:.acetaminophen, albuterol, ipratropium, ondansetron (ZOFRAN) IV  Imaging: Dg Chest Portable 1 View  05/21/2011  *RADIOLOGY REPORT*  Clinical Data: Post implant, check position.  PORTABLE CHEST - 1 VIEW  Comparison: 05/20/2011.  Findings: Patient is slightly rotated.  Trachea is otherwise midline.  Heart size stable.  Pacemaker lead tips project over the right atrium and right ventricle.  No pneumothorax.  Biapical pleural thickening. Mild central interstitial prominence.  No definite pleural fluid.  IMPRESSION:  1.  Pacemaker placement without complicating feature. 2.  Central pulmonary vascular congestion.  Original Report Authenticated By: Reyes Ivan, M.D.   Dg Chest Portable 1 View  05/20/2011  *RADIOLOGY REPORT*  Clinical Data: Chest pain.  Bradycardia.  Preop respiratory exam for pacemaker.  PORTABLE CHEST - 1 VIEW  Comparison: 03/27/2007  Findings: Mild cardiomegaly and pulmonary vascular congestion again demonstrated, consistent with pulmonary venous hypertension.  No evidence of frank pulmonary edema.  No evidence of pulmonary consolidation or effusion.  IMPRESSION: Stable cardiomegaly and chronic pulmonary venous  hypertension.  No acute findings.  Original Report Authenticated By: Danae Orleans, M.D.   Assessment/Plan:  1. Complete heart block:  On presentation patient had complete heart block with a ventricular escape rhythm.  He underwent permanent pacemaker 5/2 emergently. He has been doing well with this. Chest x-ray from 5/3 showed some central pulmonary vascular congestions and no sequale of the pacemaker placement. Tele shows that he is 100% paced and on device interrogation he has no underlying escape rhythm so the plan is to  monitor him on tele until tomorrow 05/23/11 and send him home after that. We will plan to mobilize today   2. Tobacco abuse: Smoking cessation consult done and he appears ready to quit but wants help.    3. Constipation: No bowel movement since admission so we will give him some miralax today and follow.   4.  Chronic Systolic CHF exacerbation secondary to complete heart block: The patient's BNP is elevated on admission. In addition he became short of breath during admission.  He has continued to diuresed well on oral Lasix 40 mg daily.  He will likely need to go out on Lasix with follow up as an outpatient.  Total diuresis 5.5L since admission.  Most likely related to his marked bradycardia from the complete heart block. Echo showed EF of 55-60% with mild LVH and no comment about diastolic dysfunction.  Repeat BNP was down from admission.  Electrolytes are good and his volumes status is better.   5. Urinary retention: Patient required that a Foley be placed last night. He tells me today he's had problems with urinary retention in the past. He has a urologist in [facility] who was contacted by Bjorn Loser.  He suggested that we leave the foley in place on discharge and have him follow up in the office next week.    6. Steroid dependence: The patient is on daily prednisone for unknown reason.  He has been continued on his home dose and will follow with his PCP after discharge.   Length of Stay: 2 days  Patient history and plan of care reviewed with attending, Dr. Jennelle Human, M.D. 05/22/2011, 9:03 AM Patient examined and agree.  Valera Castle, MD 05/23/2011 10:21 AM

## 2011-05-23 ENCOUNTER — Inpatient Hospital Stay (HOSPITAL_COMMUNITY): Payer: Medicare Other

## 2011-05-23 DIAGNOSIS — I5022 Chronic systolic (congestive) heart failure: Secondary | ICD-10-CM

## 2011-05-23 LAB — CBC
HCT: 45.2 % (ref 39.0–52.0)
MCH: 32.4 pg (ref 26.0–34.0)
MCV: 94 fL (ref 78.0–100.0)
Platelets: 166 10*3/uL (ref 150–400)
RDW: 12.8 % (ref 11.5–15.5)

## 2011-05-23 LAB — URINALYSIS, ROUTINE W REFLEX MICROSCOPIC
Bilirubin Urine: NEGATIVE
Ketones, ur: NEGATIVE mg/dL
Nitrite: NEGATIVE
Specific Gravity, Urine: 1.024 (ref 1.005–1.030)
Urobilinogen, UA: 1 mg/dL (ref 0.0–1.0)

## 2011-05-23 LAB — BASIC METABOLIC PANEL
BUN: 18 mg/dL (ref 6–23)
CO2: 29 mEq/L (ref 19–32)
Calcium: 9 mg/dL (ref 8.4–10.5)
Chloride: 97 mEq/L (ref 96–112)
Creatinine, Ser: 1.05 mg/dL (ref 0.50–1.35)
Glucose, Bld: 136 mg/dL — ABNORMAL HIGH (ref 70–99)

## 2011-05-23 LAB — URINE MICROSCOPIC-ADD ON

## 2011-05-23 MED ORDER — NICOTINE 21 MG/24HR TD PT24
21.0000 mg | MEDICATED_PATCH | Freq: Every day | TRANSDERMAL | Status: DC
  Administered 2011-05-23 – 2011-05-24 (×2): 21 mg via TRANSDERMAL
  Filled 2011-05-23 (×3): qty 1

## 2011-05-23 MED ORDER — ALUM & MAG HYDROXIDE-SIMETH 200-200-20 MG/5ML PO SUSP
30.0000 mL | Freq: Once | ORAL | Status: AC
  Administered 2011-05-24: 30 mL via ORAL

## 2011-05-23 NOTE — Progress Notes (Addendum)
Lance Smith Internal Medicine Resident Note  Subjective:  Denies any chest soreness today. Feels a little short winded after walking yesterday but otherwise denies any chest pain, SOB or abdominal pain. Had a BM yesterday and feels better. Urine output good. Patient is noted to have low grade fever with a max  of 100.6 this am  Objective:  Vital Signs in the last 24 hours: Filed Vitals:   05/22/11 1955 05/22/11 2355 05/23/11 0400 05/23/11 0750  BP: 141/62 153/68 123/43   Pulse: 75 78 82   Temp: 99 F (37.2 C) 99.2 F (37.3 C) 100.6 F (38.1 C) 98.9 F (37.2 C)  TempSrc: Oral Oral Oral Oral  Resp:      Height:      Weight:      SpO2: 94% 96% 96% 92%    Intake/Output from previous day: 05/04 0701 - 05/05 0700 In: 1400 [P.O.:1400] Out: 1500 [Urine:1500]  24-hour weight change: Weight change:   Weight trends: Filed Weights   05/20/11 2129  Weight: 187 lb 6.3 oz (85 kg)    Physical Exam: Pt is alert and oriented, NAD Neck: supple, carotids 2+ equal without bruits Lungs: CTA bilaterally VW:[id number] wheezing throughout. Bandage in place over left chest.  Abd: soft, NT, Positive BS, Ext: no C/C/E, distal pulses intact and equal  Lab Results:  Basename 05/20/11 1541  WBC 14.6*  HGB 14.3  PLT 189    Basename 05/23/11 0525 05/22/11 0500  NA 135 137  K 3.7 3.8  CL 97 99  CO2 29 28  GLUCOSE 136* 181*  BUN 18 20  CREATININE 1.05 1.14    Basename 05/21/11 1135 05/20/11 1540  TROPONINI <0.30 <0.30    Basename 05/21/11 1135 05/20/11 1540  PROBNP 3024.0* 4782.0*     Scheduled Meds:   . alum & mag hydroxide-simeth  30 mL Oral Once  . furosemide  40 mg Oral Daily  . polyethylene glycol  17 g Oral Daily  . predniSONE  10 mg Oral Q breakfast  . simvastatin  40 mg Oral q1800   Continuous Infusions:   . sodium chloride     PRN Meds:.acetaminophen, albuterol, ipratropium, ondansetron (ZOFRAN) IV  Imaging: No results found. Assessment/Plan:     1.  Complete heart block: On presentation patient had complete heart block with a ventricular escape rhythm. He underwent permanent pacemaker 5/2 emergently. He has been doing well with this. Chest x-ray from 5/3 showed some central pulmonary vascular congestions and no sequale of the pacemaker placement. Tele shows that he is 100% paced and on device interrogation he has no underlying escape rhythm so the plan is to monitor him on tele.   2. Tobacco abuse: Smoking cessation consult done and he appears ready to quit but wants help. Started to smoke at age 24. Has cut down recently from 2 packs to 1 pack a day. Nicotine patch provided today.   3. Constipation: bowel movement yesterday after starting on  miralax.   4. Chronic Systolic CHF exacerbation secondary to complete heart block: The patient's BNP is elevated on admission. In addition he became short of breath during admission. He has continued to diuresed well on oral Lasix 40 mg daily. He will likely need to go out on Lasix with follow up as an outpatient. Total diuresis 5.2L since admission. Most likely related to his marked bradycardia from the complete heart block. Echo showed EF of 55-60% with mild LVH and no comment about diastolic dysfunction. Repeat BNP was down  from admission. Electrolytes are good and his volumes status is better .  5. Urinary retention: Patient required that a Foley be placed last night. He tells me today he's had problems with urinary retention in the past. He has a urologist in [facility] who was contacted by Bjorn Loser. He suggested that we leave the foley in place on discharge and have him follow up in the office next week.   6. Steroid dependence: The patient is on daily prednisone for unknown reason. He has been continued on his home dose and will follow with his PCP after discharge.   7. Elevated Temp: With recent pace maker placement possible  infectious causes was evaluated. CXray was negative for any acute process.  Wheezing improved with albuterol nebs x1. Patient is not on any inhalers at home. WBC 13.3 which is down from 14.6 on admission. Will obtain UA. If afebrile patient can be discharged tomorrow.   Length of Stay: 3 days  Patient history and plan of care reviewed with attending, Dr. Valera Castle.   Almyra Deforest, M.D. 05/23/2011, 9:17 AM  Patient examined and agree. Check CXR and CBC. Incentive spirometry. If afebrile next 24 hours can discharge home.  Valera Castle, MD 05/23/2011 10:32 AM

## 2011-05-24 DIAGNOSIS — R739 Hyperglycemia, unspecified: Secondary | ICD-10-CM | POA: Diagnosis present

## 2011-05-24 LAB — PRO B NATRIURETIC PEPTIDE: Pro B Natriuretic peptide (BNP): 6616 pg/mL — ABNORMAL HIGH (ref 0–450)

## 2011-05-24 LAB — BASIC METABOLIC PANEL
BUN: 21 mg/dL (ref 6–23)
CO2: 28 mEq/L (ref 19–32)
Chloride: 95 mEq/L — ABNORMAL LOW (ref 96–112)
Creatinine, Ser: 1.09 mg/dL (ref 0.50–1.35)
GFR calc Af Amer: 74 mL/min — ABNORMAL LOW (ref >90)
Potassium: 3.9 mEq/L (ref 3.5–5.1)

## 2011-05-24 MED ORDER — ALBUTEROL SULFATE HFA 108 (90 BASE) MCG/ACT IN AERS
2.0000 | INHALATION_SPRAY | Freq: Four times a day (QID) | RESPIRATORY_TRACT | Status: DC | PRN
  Filled 2011-05-24: qty 6.7

## 2011-05-24 MED ORDER — TRAMADOL HCL 50 MG PO TABS
50.0000 mg | ORAL_TABLET | Freq: Four times a day (QID) | ORAL | Status: DC | PRN
  Administered 2011-05-24: 50 mg via ORAL
  Filled 2011-05-24: qty 1

## 2011-05-24 MED ORDER — POTASSIUM CHLORIDE CRYS ER 20 MEQ PO TBCR
20.0000 meq | EXTENDED_RELEASE_TABLET | Freq: Once | ORAL | Status: AC
  Administered 2011-05-24: 20 meq via ORAL
  Filled 2011-05-24: qty 1

## 2011-05-24 MED ORDER — ALBUTEROL SULFATE HFA 108 (90 BASE) MCG/ACT IN AERS: 2.0000 | INHALATION_SPRAY | Freq: Four times a day (QID) | RESPIRATORY_TRACT | Status: DC | PRN

## 2011-05-24 MED ORDER — FUROSEMIDE 10 MG/ML IJ SOLN
40.0000 mg | Freq: Once | INTRAMUSCULAR | Status: AC
  Administered 2011-05-24: 40 mg via INTRAVENOUS
  Filled 2011-05-24: qty 4

## 2011-05-24 MED ORDER — NICOTINE 21 MG/24HR TD PT24: 1.0000 | MEDICATED_PATCH | Freq: Every day | TRANSDERMAL | Status: AC

## 2011-05-24 MED ORDER — FUROSEMIDE 40 MG PO TABS: 40.0000 mg | ORAL_TABLET | Freq: Every day | ORAL | Status: DC

## 2011-05-24 NOTE — Discharge Summary (Signed)
CARDIOLOGY DISCHARGE SUMMARY   Patient ID: Lance Smith MRN: [id number] DOB/AGE: 1931-02-13 78 y.o.  Admit date: 05/20/2011 Discharge date: 05/24/2011  Primary Discharge Diagnosis:  Complete heart block Secondary Discharge Diagnosis:   Tobacco abuse  Elevated brain natriuretic peptide (BNP) level  CHF (congestive heart failure)  Urinary retention  Steroid dependence Hyperglycemia . Arthritis   . Hyperlipidemia   . DVT (deep venous thrombosis), unspecified laterality   . Colon polyp   . COPD (chronic obstructive pulmonary disease)   . Shortness of breath    Procedures: dual pacemaker implantation -st jude pulse generator serial number [id number], 2D echocardiogram, CXR  Hospital Course: Lance Smith is a 78 year old male with no history of CAD/arrhythmia.  He developed DOE and noted his heart rate to be in the 30s. He came to the ER and was in CHB with a ventricular escape rhythm. He also had some chest pain. He was admitted for further evaluation and treatment.   He was taken to the cath lab and a permanent pacemaker was inserted without complication. He had problems with urinary retention and a foley catheter was inserted. His urologist was contacted and recommended leaving the foley in with early office follow up.  His follow up CXR did not show PTX but did show congestion and his BNP was elevated. He was started on Lasix IV -> po. He had some leukocytosis and low-grade fever. His CXR did not show PNA and a UA was without bacteria but did show glucose 500, trace leukocytes and large hemoglobin but no bacteria. Because of the elevated glucose, he was asked to start on a diabetic diet and follow up with Dr Jacky Kindle. His fever resolved and the leukocytosis may be from chronic steroid use. He had problems with wheezing and was given nebulizers in the hospital and an inhaler at discharge. On 05/24/2011, Dr Tenny Craw felt his respirations were improving but not at baseline so he was given 1 more dose of IV  Lasix.  He will be discharged home after the IV Lasix with close outpatient follow up arranged.   Labs:   Lab Results  Component Value Date   WBC 13.3* 05/23/2011   HGB 15.6 05/23/2011   HCT 45.2 05/23/2011   MCV 94.0 05/23/2011   PLT 166 05/23/2011    Lab 05/24/11 0908  NA 137  K 3.9  CL 95*  CO2 28  BUN 21  CREATININE 1.09  CALCIUM 9.3  PROT --  BILITOT --  ALKPHOS --  ALT --  AST --  GLUCOSE 189*    Basename 05/21/11 1135  CKTOTAL 56  CKMB 2.2  CKMBINDEX --  TROPONINI <0.30   Pro B Natriuretic peptide (BNP)  Date/Time Value Range Status  05/24/2011  9:08 AM 6616.0* 0-450 (pg/mL) Final  05/21/2011 11:35 AM 3024.0* 0-450 (pg/mL) Final   Radiology: Dg Chest 2 View 05/23/2011  *RADIOLOGY REPORT*  Clinical Data: Shortness of breath, pacemaker  CHEST - 2 VIEW  Comparison: May 21, 2011 and March 20, 2007  Findings: Mild cardiomegaly is unchanged.  The pacemaker leads are intact and stable over the right atrium and right ventricle.  The pulmonary vasculature and mediastinum are within normal limits. There is stable pleural/ parenchymal scarring in the left lung base.  IMPRESSION:   Stable chest x-ray with no evidence of acute cardiac or pulmonary process.  Original Report Authenticated By: Brandon Melnick, M.D.   Dg Chest Portable 1 View 05/21/2011  *RADIOLOGY REPORT*  Clinical Data: Post  implant, check position.  PORTABLE CHEST - 1 VIEW  Comparison: 05/20/2011.  Findings: Patient is slightly rotated.  Trachea is otherwise midline.  Heart size stable.  Pacemaker lead tips project over the right atrium and right ventricle.  No pneumothorax.  Biapical pleural thickening. Mild central interstitial prominence.  No definite pleural fluid.  IMPRESSION:  1.  Pacemaker placement without complicating feature. 2.  Central pulmonary vascular congestion.  Original Report Authenticated By: Reyes Ivan, M.D.   EKG: 1) 20-May-2011 15:30: ** Critical Test Result: Arrhythmia , AV Block Sinus rhythm with  complete heart block and Idioventricular rhythm Left axis deviation Left bundle branch block Vent. rate 36 BPM PR interval * ms QRS duration 154 ms QT/QTc 528/408 ms P-R-T axes 60 -57 96  2) 21-May-2011 07:34:08  Atrial-sensed ventricular-paced rhythm Vent. rate 70 BPM PR interval 204 ms QRS duration 188 ms QT/QTc 504/544 ms P-R-T axes 27 -86 74  Echo: Study Conclusions - Left ventricle: The cavity size was normal. Wall thickness was increased in a pattern of mild LVH. Systolic function was normal. The estimated ejection fraction was in the range of 55% to 60%. - Mitral valve: Calcified annulus. Mildly thickened leaflets   FOLLOW UP PLANS AND APPOINTMENTS Discharge Orders    Future Appointments: Provider: Department: Dept Phone: Center:   06/02/2011 10:30 AM [facility] [phone] [facility]   08/31/2011 11:45 AM Duke Salvia, MD [facility] [phone] [facility]     Allergies  Allergen Reactions  . Aspirin Nausea Only and Other (See Comments)    headache  . Tetanus Toxoids Other (See Comments)    Unspecified reaction  . Penicillins Swelling   Medication List  As of 05/24/2011  5:39 PM   TAKE these medications         albuterol 108 (90 BASE) MCG/ACT inhaler   Commonly known as: PROVENTIL HFA;VENTOLIN HFA   Inhale 2 puffs into the lungs every 6 (six) hours as needed for wheezing.      furosemide 40 MG tablet   Commonly known as: LASIX   Take 1 tablet (40 mg total) by mouth daily.      nicotine 21 mg/24hr patch   Commonly known as: NICODERM CQ - dosed in mg/24 hours   Place 1 patch onto the skin daily. Change to a weaker patch after 7 days.      nitroGLYCERIN 0.4 MG SL tablet   Commonly known as: NITROSTAT   Place 0.4 mg under the tongue every 5 (five) minutes as needed. For chest pain      predniSONE 10 MG tablet   Commonly known as: DELTASONE   Take 10 mg by mouth every morning.      simvastatin 40 MG tablet    Commonly known as: ZOCOR   Take 40 mg by mouth every morning.              Follow-up Information    Follow up with Ivar Bury., MD. (Tuesday, May 7th at 11:10 am)    Contact information:   [address] [place] [address] [phone]       Follow up with [facility]. (Wound check and pacer check, May 15th at 10:30)       Follow up with Sherryl Manges, MD. (August 13th at 11:45 am)    Contact information:   [address] [place] [address] [phone]  BRING ALL MEDICATIONS WITH YOU TO FOLLOW UP APPOINTMENTS  Time spent with patient to include physician time: 49 min Signed: Theodore Demark 05/24/2011, 10:41 AM Co-Sign MD

## 2011-05-24 NOTE — Discharge Instructions (Signed)
   Your blood sugars have been elevated. You may be a diabetic - please go on a diabetic diet and follow up with your family doctor.  Supplemental Discharge Instructions for  Pacemaker/Defibrillator Patients  Activity No heavy lifting or vigorous activity with your left/right arm for 6 to 8 weeks.  Do not raise your left/right arm above your head for one week.  Gradually raise your affected arm as drawn below.                       May 6th                  May 7th                     May 8th                May 9th   NO DRIVING for  1 week    ; you may begin driving on     May 10th     . WOUND CARE   Keep the wound area clean and dry.  Do not get this area wet for one week. No showers for one week; you may shower on      May 10th        .   The tape/steri-strips on your wound will fall off; do not pull them off.  No bandage is needed on the site.  DO  NOT apply any creams, oils, or ointments to the wound area.   If you notice any drainage or discharge from the wound, any swelling or bruising at the site, or you develop a fever > 101? F after you are discharged home, call the office at once.  Special Instructions   You are still able to use cellular telephones; use the ear opposite the side where you have your pacemaker/defibrillator.  Avoid carrying your cellular phone near your device.   When traveling through airports, show security personnel your identification card to avoid being screened in the metal detectors.  Ask the security personnel to use the hand wand.   Avoid arc welding equipment, MRI testing (magnetic resonance imaging), TENS units (transcutaneous nerve stimulators).  Call the office for questions about other devices.   Avoid electrical appliances that are in poor condition or are not properly grounded.   Microwave ovens are safe to be near or to operate.  Additional information for defibrillator patients should your device go off:   If your device goes off ONCE and you feel  fine afterward, notify the device clinic nurses.   If your device goes off ONCE and you do not feel well afterward, call 911.   If your device goes off TWICE, call 911.   If your device goes off THREE times in one day, call 911.  DO NOT DRIVE YOURSELF OR A FAMILY MEMBER WITH A DEFIBRILLATOR TO THE HOSPITAL--CALL 911.

## 2011-05-24 NOTE — Progress Notes (Signed)
Subjective: Breathing better.  No CP  Didn't sleep well. Objective: Filed Vitals:   05/23/11 1714 05/23/11 1951 05/23/11 2334 05/24/11 0300  BP:  104/83 97/70 109/59  Pulse:  82 91 91  Temp:  97.5 F (36.4 C) 98.8 F (37.1 C) 100.2 F (37.9 C)  TempSrc:  Oral Oral Oral  Resp:  20 20 20   Height:      Weight:      SpO2: 96% 96% 96% 97%   Weight change:   Intake/Output Summary (Last 24 hours) at 05/24/11 0734 Last data filed at 05/24/11 0500  Gross per 24 hour  Intake   1020 ml  Output   1801 ml  Net   -781 ml    General: Alert, awake, oriented x3, in no acute distress Neck:  JVP is normal Heart: Regular rate and rhythm, without murmurs, rubs, gallops.  Lungs: Clear to auscultation.  No rales or wheezes. Exemities:  No edema.   Neuro: Grossly intact, nonfocal.   Lab Results: Results for orders placed during the hospital encounter of 05/20/11 (from the past 24 hour(s))  CBC     Status: Abnormal   Collection Time   05/23/11  9:44 AM      Component Value Range   WBC 13.3 (*) 4.0 - 10.5 (K/uL)   RBC 4.81  4.22 - 5.81 (MIL/uL)   Hemoglobin 15.6  13.0 - 17.0 (g/dL)   HCT 10.2  72.5 - 36.6 (%)   MCV 94.0  78.0 - 100.0 (fL)   MCH 32.4  26.0 - 34.0 (pg)   MCHC 34.5  30.0 - 36.0 (g/dL)   RDW 44.0  34.7 - 42.5 (%)   Platelets 166  150 - 400 (K/uL)  URINALYSIS, ROUTINE W REFLEX MICROSCOPIC     Status: Abnormal   Collection Time   05/23/11  6:54 PM      Component Value Range   Color, Urine YELLOW  YELLOW    APPearance CLOUDY (*) CLEAR    Specific Gravity, Urine 1.024  1.005 - 1.030    pH 5.5  5.0 - 8.0    Glucose, UA 500 (*) NEGATIVE (mg/dL)   Hgb urine dipstick LARGE (*) NEGATIVE    Bilirubin Urine NEGATIVE  NEGATIVE    Ketones, ur NEGATIVE  NEGATIVE (mg/dL)   Protein, ur NEGATIVE  NEGATIVE (mg/dL)   Urobilinogen, UA 1.0  0.0 - 1.0 (mg/dL)   Nitrite NEGATIVE  NEGATIVE    Leukocytes, UA TRACE (*) NEGATIVE   URINE MICROSCOPIC-ADD ON     Status: Normal   Collection Time     05/23/11  6:54 PM      Component Value Range   WBC, UA 0-2  <3 (WBC/hpf)   RBC / HPF 3-6  <3 (RBC/hpf)    Studies/Results: Dg Chest 2 View  05/23/2011  *RADIOLOGY REPORT*  Clinical Data: Shortness of breath, pacemaker  CHEST - 2 VIEW  Comparison: May 21, 2011 and March 20, 2007  Findings: Mild cardiomegaly is unchanged.  The pacemaker leads are intact and stable over the right atrium and right ventricle.  The pulmonary vasculature and mediastinum are within normal limits. There is stable pleural/ parenchymal scarring in the left lung base.  IMPRESSION:   Stable chest x-ray with no evidence of acute cardiac or pulmonary process.  Original Report Authenticated By: Brandon Melnick, M.D.    Medications: I have reviewed the patient's current medications.   Patient Active Hospital Problem List: Complete heart block (05/20/2011)   Assessment: s/p  PPM  Will make sure he has f/u Tobacco abuse (05/20/2011)   Assessment: Counselled on cessation.  Patient trying  Continue nicotine patch. Elevated brain natriuretic peptide (BNP) level (05/21/2011)   Assessment: Has diuresed approx 6.3L since admit.  ON exam no evid of signif volume increase.  Patients breathing is some better by his report.  Will recheck BNP  Echo showed LVEF of 55 to 60% with mild LVH   CHF (congestive heart failure) (05/21/2011)   Assessment: As noted above.  Improved.  PRobably due to bradycardia.    Steroid dependence (05/21/2011)   Assessment: Continue.  FOr arthritis.  Followed by Dr.Aronson.   LOS: 4 days   Dietrich Pates 05/24/2011, 7:34 AM

## 2011-05-27 ENCOUNTER — Ambulatory Visit (INDEPENDENT_AMBULATORY_CARE_PROVIDER_SITE_OTHER): Payer: Medicare Other | Admitting: *Deleted

## 2011-05-27 ENCOUNTER — Encounter: Payer: Self-pay | Admitting: Nurse Practitioner

## 2011-05-27 ENCOUNTER — Ambulatory Visit (INDEPENDENT_AMBULATORY_CARE_PROVIDER_SITE_OTHER): Payer: Medicare Other | Admitting: Nurse Practitioner

## 2011-05-27 VITALS — BP 128/77 | HR 67 | Ht 70.0 in | Wt 180.0 lb

## 2011-05-27 DIAGNOSIS — I5032 Chronic diastolic (congestive) heart failure: Secondary | ICD-10-CM

## 2011-05-27 DIAGNOSIS — Z720 Tobacco use: Secondary | ICD-10-CM

## 2011-05-27 DIAGNOSIS — I442 Atrioventricular block, complete: Secondary | ICD-10-CM

## 2011-05-27 DIAGNOSIS — F17200 Nicotine dependence, unspecified, uncomplicated: Secondary | ICD-10-CM

## 2011-05-27 DIAGNOSIS — I5030 Unspecified diastolic (congestive) heart failure: Secondary | ICD-10-CM

## 2011-05-27 DIAGNOSIS — I509 Heart failure, unspecified: Secondary | ICD-10-CM

## 2011-05-27 LAB — BASIC METABOLIC PANEL
CO2: 26 mEq/L (ref 19–32)
GFR: 57.78 mL/min — ABNORMAL LOW (ref >60.00)
Glucose, Bld: 153 mg/dL — ABNORMAL HIGH (ref 70–99)
Potassium: 4.4 mEq/L (ref 3.5–5.1)
Sodium: 140 mEq/L (ref 135–145)

## 2011-05-27 NOTE — Progress Notes (Signed)
Patient Name: Lance Smith Date of Encounter: 05/27/2011  Primary Care Provider:  Minda Meo, MD, MD Primary Cardiologist:  Odessa Fleming, MD  Patient Profile  78 y/o male who was recently admitted with CHF req PPM who presents for f/u.  Problem List   Past Medical History  Diagnosis Date  . Arthritis   . Hyperlipidemia   . DVT (deep venous thrombosis), unspecified laterality     a. previously on coumadin - d/c'd spring of 2013  . Colon polyp   . COPD (chronic obstructive pulmonary disease)   . Complete heart block     a. 05/2011 s/p SJM Model [id number] Dual Chamber PPM  . Hyperglycemia   . Diastolic CHF     a. 05/2011 Echo: EF 55-60%  . Steroid dependence   . Tobacco abuse    Past Surgical History  Procedure Date  . Bilateral inguinal hernia     Allergies  Allergies  Allergen Reactions  . Aspirin Nausea Only and Other (See Comments)    headache  . Tetanus Toxoids Other (See Comments)    Unspecified reaction  . Penicillins Swelling    HPI  78 y/o male with the above problem list.  Last week, pt developed sudden dyspnea.  He checked his HR and BP and found his HR to be 31.  He presented to the [facility] ED, where he was found to be in CHB.  He was admitted and underwent PPM placement.  He tolerated this procedure well but post-procedure was noted to have volume overload with elevated bnp and was diuresed.  Since d/c, 3 days ago, he has been feeling well and his strength is steadily improving.  He has been weighing himself daily and his weight has been stable.  He denies chest pain, sob, pnd, orthopnea, dizziness, or syncope.  His left upper chest pacer site is healing well and though it is tender, is not giving him too much trouble.  Home Medications  Prior to Admission medications   Medication Sig Start Date End Date Taking? Authorizing Provider  albuterol (PROVENTIL HFA;VENTOLIN HFA) 108 (90 BASE) MCG/ACT inhaler Inhale 2 puffs into the lungs every 6 (six) hours as  needed for wheezing. 05/24/11 05/23/12 Yes Rhonda G Barrett, PA  furosemide (LASIX) 40 MG tablet Take 1 tablet (40 mg total) by mouth daily. 05/24/11 05/23/12 Yes Rhonda G Barrett, PA  nicotine (NICODERM CQ - DOSED IN MG/24 HOURS) 21 mg/24hr patch Place 1 patch onto the skin daily. Change to a weaker patch after 7 days. 05/24/11 06/23/11 Yes Rhonda G Barrett, PA  nitroGLYCERIN (NITROSTAT) 0.4 MG SL tablet Place 0.4 mg under the tongue every 5 (five) minutes as needed. For chest pain   Yes Historical Provider, MD  predniSONE (DELTASONE) 10 MG tablet Take 10 mg by mouth every morning.  07/15/10  Yes Historical Provider, MD  simvastatin (ZOCOR) 40 MG tablet Take 40 mg by mouth every morning.  07/15/10  Yes Historical Provider, MD  warfarin (COUMADIN) 5 MG tablet Take 1 tablet by mouth as directed. 03/17/11  Yes Historical Provider, MD   Review of Systems As above, feeling well.  Pacer site slightly tender. All other systems reviewed and are otherwise negative except as noted above.  Physical Exam  Blood pressure 128/77, pulse 67, height 5\' 10"  (1.778 m), weight 180 lb (81.647 kg).  General: Pleasant, NAD Psych: Normal affect. Neuro: Alert and oriented X 3. Moves all extremities spontaneously. HEENT: Normal  Neck: Supple without bruits or JVD. Lungs:  Resp regular and unlabored, CTA. Heart: RRR no s3, s4, or murmurs. Abdomen: Soft, non-tender, non-distended, BS + x 4.  Left upper chest pacer site is healing well w/o erythema, heat, hematoma. Extremities: No clubbing, cyanosis or edema. DP/PT/Radials 2+ and equal bilaterally.  Accessory Clinical Findings  bmet pending  Assessment & Plan  1.  CHB:  S/p PPM.  Site looks good.  To be seen in device clinic next week.  2.  Acute diast chf:  In setting of #1 during hospitalization.  Volume looks good.  NL ef by echo.  Check bmet today.  Suspect we may be able to d/c lasix (was not on prior to hospitalization).  3.  Tob Abuse:  Cessation advised.  4.   Dispo:  F/U bmet, device clinic next week and f/u SK in August.  Nicolasa Ducking, NP 05/27/2011, 1:43 PM

## 2011-05-27 NOTE — Patient Instructions (Signed)
Keep up coming follow up appointments.  Labwork today.

## 2011-06-02 ENCOUNTER — Encounter: Payer: Self-pay | Admitting: Internal Medicine

## 2011-06-02 ENCOUNTER — Ambulatory Visit (INDEPENDENT_AMBULATORY_CARE_PROVIDER_SITE_OTHER): Payer: Medicare Other | Admitting: *Deleted

## 2011-06-02 DIAGNOSIS — I442 Atrioventricular block, complete: Secondary | ICD-10-CM

## 2011-06-02 LAB — PACEMAKER DEVICE OBSERVATION
RV LEAD IMPEDENCE PM: 575 Ohm
RV LEAD THRESHOLD: 0.75 V

## 2011-06-02 NOTE — Progress Notes (Signed)
Wound check pacer in clinic  

## 2011-06-28 ENCOUNTER — Telehealth: Payer: Self-pay | Admitting: Internal Medicine

## 2011-06-28 NOTE — Telephone Encounter (Signed)
I reviewed the patient's situation with Dr. Riley Kill (DOD). He recommends the patient to lasix 40 mg x 1 dose now and follow up with the PA/ NP tomorrow to reassess weight and renal function. His weight in the office was 180 lbs on 5/9. I have called and explained this to the patient's wife. She states he was around 181-182 lbs on his home scale when he got out of the hospital. She verbalizes understanding of Dr. Rosalyn Charters recommendations and they will come and see Lawson Fiscal tomorrow at 3:30 pm.

## 2011-06-28 NOTE — Telephone Encounter (Signed)
I spoke with the patient. He states that he has had feet and leg swelling (up to the knees) for the past couple of weeks. He states his legs are "double in size." He is currently off lasix as we stopped this for him based on his last lab reading. He states his weight only flucuates about a pound up and down from his baseline. His baseline weight is now about 187. He was 188 lbs today. He occasionally has some SOB with activity, but none at rest. He is not having to prop up on pillows to sleep. He has CP that comes and goes, but he thinks this is related to his stomach as it is usually worse after eating and improves with belching. He has been placed on Rapaflo 8 mg for his prostate. His last creatinine was 1.3 with a BUN of 25. I explained I will review with our provider and call him back with recommendations. He is agreeable.

## 2011-06-28 NOTE — Telephone Encounter (Signed)
New Problem:    Patient called in because he is having trouble with his legs and ankles swelling for the past few weeks and would like to know how to proceed.  Patient occasionally experiences dull chest pain. Please call back, patient's wife may answer call.

## 2011-06-29 ENCOUNTER — Ambulatory Visit (INDEPENDENT_AMBULATORY_CARE_PROVIDER_SITE_OTHER): Payer: Medicare Other | Admitting: Nurse Practitioner

## 2011-06-29 ENCOUNTER — Encounter: Payer: Self-pay | Admitting: Nurse Practitioner

## 2011-06-29 VITALS — BP 130/72 | HR 86 | Ht 68.5 in | Wt 185.6 lb

## 2011-06-29 DIAGNOSIS — Z950 Presence of cardiac pacemaker: Secondary | ICD-10-CM

## 2011-06-29 DIAGNOSIS — I5030 Unspecified diastolic (congestive) heart failure: Secondary | ICD-10-CM

## 2011-06-29 DIAGNOSIS — R0609 Other forms of dyspnea: Secondary | ICD-10-CM

## 2011-06-29 DIAGNOSIS — I5032 Chronic diastolic (congestive) heart failure: Secondary | ICD-10-CM | POA: Insufficient documentation

## 2011-06-29 DIAGNOSIS — R0600 Dyspnea, unspecified: Secondary | ICD-10-CM

## 2011-06-29 DIAGNOSIS — R0989 Other specified symptoms and signs involving the circulatory and respiratory systems: Secondary | ICD-10-CM

## 2011-06-29 DIAGNOSIS — Z720 Tobacco use: Secondary | ICD-10-CM

## 2011-06-29 DIAGNOSIS — F17200 Nicotine dependence, unspecified, uncomplicated: Secondary | ICD-10-CM

## 2011-06-29 MED ORDER — FUROSEMIDE 20 MG PO TABS: 20.0000 mg | ORAL_TABLET | Freq: Every day | ORAL | Status: DC

## 2011-06-29 NOTE — Progress Notes (Signed)
Lance Smith Date of Birth: 1931/08/01 Medical Record #[id number]  History of Present Illness: Lance Smith is seen today for a work in visit. He is seen for Dr. Graciela Husbands. He has a new pacemaker for CHB, COPD with ongoing tobacco abuse and diastolic heart failure. His EF is normal at 55 to 60%.  He comes in today. He is here alone. He has been having more swelling in his legs followed by dyspnea. Weight had been going up. He called yesterday and was told to take a dose of Lasix 40 mg x 1. This helped him tremendously. He is feeling better today. He does have some occasional chest pain that is resolved with belching and he is not really too concerned about it. He remains active. Still smoking and has cut back from his prior 2 PPD use. He is using salt. His legs have not been red or hot to touch and do not hurt.   Current Outpatient Prescriptions on File Prior to Visit  Medication Sig Dispense Refill  . albuterol (PROVENTIL HFA;VENTOLIN HFA) 108 (90 BASE) MCG/ACT inhaler Inhale 2 puffs into the lungs every 6 (six) hours as needed for wheezing.  1 Inhaler  2  . furosemide (LASIX) 40 MG tablet Take 1 tablet (40 mg total) by mouth daily.  30 tablet  0  . nitroGLYCERIN (NITROSTAT) 0.4 MG SL tablet Place 0.4 mg under the tongue every 5 (five) minutes as needed. For chest pain      . predniSONE (DELTASONE) 10 MG tablet Take 10 mg by mouth every morning.       . simvastatin (ZOCOR) 40 MG tablet Take 40 mg by mouth every morning.         Allergies  Allergen Reactions  . Aspirin Nausea Only and Other (See Comments)    headache  . Tetanus Toxoids Other (See Comments)    Unspecified reaction  . Penicillins Swelling    Past Medical History  Diagnosis Date  . Arthritis   . Hyperlipidemia   . DVT (deep venous thrombosis), unspecified laterality     a. previously on coumadin - d/c'd spring of 2013  . Colon polyp   . COPD (chronic obstructive pulmonary disease)   . Complete heart block     a. 05/2011  s/p SJM Model [id number] Dual Chamber PPM  . Hyperglycemia   . Diastolic CHF     a. 05/2011 Echo: EF 55-60%  . Steroid dependence   . Tobacco abuse     Past Surgical History  Procedure Date  . Bilateral inguinal hernia     History  Smoking status  . Current Everyday Smoker -- 2.0 packs/day  . Types: Cigarettes  Smokeless tobacco  . Not on file    History  Alcohol Use No    History reviewed. No pertinent family history.  Review of Systems: The review of systems is per the HPI.  All other systems were reviewed and are negative.  Physical Exam: BP 130/72  Pulse 86  Ht 5' 8.5" (1.74 m)  Wt 185 lb 9.6 oz (84.188 kg)  BMI 27.81 kg/m2  SpO2 93% Patient is very pleasant and in no acute distress. He smells of tobacco. Skin is warm and dry. Color is normal.  HEENT is unremarkable. Normocephalic/atraumatic. PERRL. Sclera are nonicteric. Neck is supple. No masses. No JVD. Lungs are clear. Pacemaker in the left upper chest looks good. Cardiac exam shows a regular rate and rhythm. Abdomen is soft. Extremities are with just trace edema. Gait  and ROM are intact. No gross neurologic deficits noted.   LABORATORY DATA: Lab Results  Component Value Date   WBC 13.3* 05/23/2011   HGB 15.6 05/23/2011   HCT 45.2 05/23/2011   PLT 166 05/23/2011   GLUCOSE 153* 05/27/2011   NA 140 05/27/2011   K 4.4 05/27/2011   CL 102 05/27/2011   CREATININE 1.3 05/27/2011   BUN 25* 05/27/2011   CO2 26 05/27/2011   INR 1.00 05/20/2011    Assessment / Plan:

## 2011-06-29 NOTE — Patient Instructions (Signed)
I want you to take Lasix 20 mg each day. I have sent this prescription to the drug store.  I am going to check some labs today  I would like to see you in 2 weeks.  Call the [facility] office at [phone] if you have any questions, problems or concerns.

## 2011-06-29 NOTE — Assessment & Plan Note (Signed)
While he has cut back on his smoking, he is not ready to quit and has smoked for over 60 some years.

## 2011-06-29 NOTE — Assessment & Plan Note (Signed)
His site looks good.

## 2011-06-29 NOTE — Assessment & Plan Note (Signed)
He presents with complaints of swelling and shortness of breath. He got a good response with a dose of Lasix yesterday. I have put him on 20 mg of Lasix daily. He is encouraged to continue with daily weights. We are checking BMET and BNP today. I would like to see him back in about 2 weeks. He is strongly encouraged to cut back on his salt intake. Patient is agreeable to this plan and will call if any problems develop in the interim.

## 2011-06-30 LAB — CBC WITH DIFFERENTIAL/PLATELET
Basophils Absolute: 0 10*3/uL (ref 0.0–0.1)
Basophils Relative: 0.2 % (ref 0.0–3.0)
Eosinophils Absolute: 0 10*3/uL (ref 0.0–0.7)
Eosinophils Relative: 0.2 % (ref 0.0–5.0)
HCT: 45.7 % (ref 39.0–52.0)
Hemoglobin: 15.1 g/dL (ref 13.0–17.0)
Lymphocytes Relative: 11 % — ABNORMAL LOW (ref 12.0–46.0)
Lymphs Abs: 1.3 10*3/uL (ref 0.7–4.0)
MCHC: 33.1 g/dL (ref 30.0–36.0)
MCV: 94.3 fl (ref 78.0–100.0)
Monocytes Absolute: 0.7 10*3/uL (ref 0.1–1.0)
Monocytes Relative: 5.8 % (ref 3.0–12.0)
Neutro Abs: 9.7 10*3/uL — ABNORMAL HIGH (ref 1.4–7.7)
Neutrophils Relative %: 82.8 % — ABNORMAL HIGH (ref 43.0–77.0)
Platelets: 195 10*3/uL (ref 150.0–400.0)
RBC: 4.85 Mil/uL (ref 4.22–5.81)
RDW: 14 % (ref 11.5–14.6)
WBC: 11.7 10*3/uL — ABNORMAL HIGH (ref 4.5–10.5)

## 2011-06-30 LAB — BASIC METABOLIC PANEL
BUN: 23 mg/dL (ref 6–23)
CO2: 24 mEq/L (ref 19–32)
Calcium: 9.1 mg/dL (ref 8.4–10.5)
Chloride: 107 mEq/L (ref 96–112)
Creatinine, Ser: 1.2 mg/dL (ref 0.4–1.5)
GFR: 64.07 mL/min (ref >60.00)
Glucose, Bld: 188 mg/dL — ABNORMAL HIGH (ref 70–99)
Potassium: 4.1 mEq/L (ref 3.5–5.1)
Sodium: 140 mEq/L (ref 135–145)

## 2011-06-30 LAB — BRAIN NATRIURETIC PEPTIDE: Pro B Natriuretic peptide (BNP): 502 pg/mL — ABNORMAL HIGH (ref 0.0–100.0)

## 2011-07-14 ENCOUNTER — Encounter: Payer: Self-pay | Admitting: Nurse Practitioner

## 2011-07-14 ENCOUNTER — Ambulatory Visit (INDEPENDENT_AMBULATORY_CARE_PROVIDER_SITE_OTHER): Payer: Medicare Other | Admitting: Nurse Practitioner

## 2011-07-14 ENCOUNTER — Telehealth: Payer: Self-pay | Admitting: *Deleted

## 2011-07-14 VITALS — BP 102/68 | HR 76 | Ht 69.0 in | Wt 185.0 lb

## 2011-07-14 DIAGNOSIS — R0602 Shortness of breath: Secondary | ICD-10-CM

## 2011-07-14 DIAGNOSIS — Z720 Tobacco use: Secondary | ICD-10-CM

## 2011-07-14 DIAGNOSIS — I442 Atrioventricular block, complete: Secondary | ICD-10-CM

## 2011-07-14 DIAGNOSIS — I5032 Chronic diastolic (congestive) heart failure: Secondary | ICD-10-CM

## 2011-07-14 DIAGNOSIS — I5030 Unspecified diastolic (congestive) heart failure: Secondary | ICD-10-CM

## 2011-07-14 LAB — BASIC METABOLIC PANEL
BUN: 23 mg/dL (ref 6–23)
CO2: 23 mEq/L (ref 19–32)
Calcium: 8.8 mg/dL (ref 8.4–10.5)
Chloride: 104 mEq/L (ref 96–112)
Creatinine, Ser: 1.5 mg/dL (ref 0.4–1.5)
GFR: 49.23 mL/min — ABNORMAL LOW (ref >60.00)
Glucose, Bld: 320 mg/dL — ABNORMAL HIGH (ref 70–99)
Potassium: 4.3 mEq/L (ref 3.5–5.1)
Sodium: 135 mEq/L (ref 135–145)

## 2011-07-14 NOTE — Assessment & Plan Note (Signed)
He is s/p PTVP. Has follow up in August with Dr. Graciela Husbands.

## 2011-07-14 NOTE — Patient Instructions (Addendum)
We are going to recheck some labs  I think you are doing ok from a heart standpoint  Try to keep working on stopping smoking  We will see you in August with Dr. Graciela Husbands  Call the [facility] office at [phone] if you have any questions, problems or concerns.

## 2011-07-14 NOTE — Progress Notes (Signed)
f  [name] Date of Birth: 1932-01-09 Medical Record #[id number]  History of Present Illness: Mr. Lance Smith is seen back today for a 2 week check. He is seen for Dr. Graciela Husbands. He has a new pacemaker for CHB along with COPD with ongoing tobacco abuse and diastolic heart failure. EF is 55 to 60%. We put him on some low dose Lasix last time for swelling.   He comes in today. He is still short of breath. Still smoking and not really going to quit. Doing better with less salt. No swelling. More concerned about bruising so easily. He is on chronic prednisone. Overall, he thinks he is holding his own. He tells me that he saw urology for his BPH and had a CT that apparently imaged some of his lungs and he has a nodule that will be evaluated further. Details are not really known at this time.   Current Outpatient Prescriptions on File Prior to Visit  Medication Sig Dispense Refill  . albuterol (PROVENTIL HFA;VENTOLIN HFA) 108 (90 BASE) MCG/ACT inhaler Inhale 2 puffs into the lungs every 6 (six) hours as needed for wheezing.  1 Inhaler  2  . furosemide (LASIX) 20 MG tablet Take 1 tablet (20 mg total) by mouth daily.  90 tablet  3  . nitroGLYCERIN (NITROSTAT) 0.4 MG SL tablet Place 0.4 mg under the tongue every 5 (five) minutes as needed. For chest pain      . predniSONE (DELTASONE) 10 MG tablet Take 10 mg by mouth every morning.       Marland Kitchen RAPAFLO 8 MG CAPS capsule Take 8 mg by mouth daily with breakfast.       . simvastatin (ZOCOR) 40 MG tablet Take 40 mg by mouth every morning.       Marland Kitchen DISCONTD: furosemide (LASIX) 40 MG tablet Take 1 tablet (40 mg total) by mouth daily.  30 tablet  0    Allergies  Allergen Reactions  . Aspirin Nausea Only and Other (See Comments)    headache  . Tetanus Toxoids Other (See Comments)    Unspecified reaction  . Penicillins Swelling    Past Medical History  Diagnosis Date  . Arthritis   . Hyperlipidemia   . DVT (deep venous thrombosis), unspecified laterality     a.  previously on coumadin - d/c'd spring of 2013  . Colon polyp   . COPD (chronic obstructive pulmonary disease)   . Complete heart block     a. 05/2011 s/p SJM Model [id number] Dual Chamber PPM  . Hyperglycemia   . Diastolic CHF     a. 05/2011 Echo: EF 55-60%  . Steroid dependence   . Tobacco abuse     Past Surgical History  Procedure Date  . Bilateral inguinal hernia   . Pacemaker insertion May 2013    History  Smoking status  . Current Everyday Smoker -- 2.0 packs/day  . Types: Cigarettes  Smokeless tobacco  . Not on file    History  Alcohol Use No    History reviewed. No pertinent family history.  Review of Systems: The review of systems is positive for easy bruising.  All other systems were reviewed and are negative.  Physical Exam: BP 102/68  Pulse 76  Ht 5\' 9"  (1.753 m)  Wt 185 lb (83.915 kg)  BMI 27.32 kg/m2  SpO2 94% Patient is very pleasant and in no acute distress. Skin is warm and dry. Color is normal. He is quite ecchymotic.  HEENT is unremarkable. Normocephalic/atraumatic.  PERRL. Sclera are nonicteric. Neck is supple. No masses. No JVD. Lungs are coarse. Cardiac exam shows a regular rate and rhythm. Pacemaker site in the left upper chest looks ok. Abdomen is soft. Extremities are without edema. Gait and ROM are intact. No gross neurologic deficits noted.  LABORATORY DATA: BMET for today is pending.   Lab Results  Component Value Date   WBC 11.7* 06/29/2011   HGB 15.1 06/29/2011   HCT 45.7 06/29/2011   PLT 195.0 06/29/2011   GLUCOSE 188* 06/29/2011   NA 140 06/29/2011   K 4.1 06/29/2011   CL 107 06/29/2011   CREATININE 1.2 06/29/2011   BUN 23 06/29/2011   CO2 24 06/29/2011   INR 1.00 05/20/2011     Assessment / Plan:

## 2011-07-14 NOTE — Assessment & Plan Note (Signed)
Not really ready to stop at this time.

## 2011-07-14 NOTE — Telephone Encounter (Signed)
Advised and sent copy of labs to Dr Jacky Kindle

## 2011-07-14 NOTE — Assessment & Plan Note (Signed)
I think his heart failure is fairly well compensated. I think most of his dyspnea is probably more pulmonary related. We will leave him on the low dose Lasix. Recheck a BMET today. Salt restriction is encouraged. He sees Dr. Graciela Husbands in August and I will see him as needed. Patient is agreeable to this plan and will call if any problems develop in the interim.

## 2011-07-14 NOTE — Telephone Encounter (Signed)
Message copied by Burnell Blanks on Wed Jul 14, 2011  5:46 PM ------      Message from: Rosalio Macadamia      Created: Wed Jul 14, 2011  4:34 PM       Ok to report. Labs are satisfactory but his glucose is quite high. Needs to see his PCP ASAP for evaluation and treatment of his sugars.

## 2011-08-31 ENCOUNTER — Encounter: Payer: Medicare Other | Admitting: Internal Medicine

## 2011-09-07 ENCOUNTER — Encounter: Payer: Self-pay | Admitting: Internal Medicine

## 2011-09-07 ENCOUNTER — Ambulatory Visit (INDEPENDENT_AMBULATORY_CARE_PROVIDER_SITE_OTHER): Payer: Medicare Other | Admitting: Internal Medicine

## 2011-09-07 VITALS — BP 100/62 | HR 67 | Ht 69.0 in | Wt 184.8 lb

## 2011-09-07 DIAGNOSIS — I5030 Unspecified diastolic (congestive) heart failure: Secondary | ICD-10-CM

## 2011-09-07 DIAGNOSIS — Z950 Presence of cardiac pacemaker: Secondary | ICD-10-CM

## 2011-09-07 DIAGNOSIS — I442 Atrioventricular block, complete: Secondary | ICD-10-CM

## 2011-09-07 LAB — PACEMAKER DEVICE OBSERVATION
AL IMPEDENCE PM: 410 Ohm
ATRIAL PACING PM: 28
BAMS-0001: 180 {beats}/min
RV LEAD IMPEDENCE PM: 550 Ohm
VENTRICULAR PACING PM: 99

## 2011-09-07 NOTE — Assessment & Plan Note (Signed)
Device dependent. T

## 2011-09-07 NOTE — Assessment & Plan Note (Signed)
Continue low-dose diuretics. However, up titration is complicated by relative hypotension orthostatic intolerance.

## 2011-09-07 NOTE — Assessment & Plan Note (Signed)
The patient's device was interrogated.  The information was reviewed. Outputs were reprogrammed to maximize longevity

## 2011-09-07 NOTE — Progress Notes (Signed)
  HPI  Lance Smith is a 78 y.o. male Seen following pacemaker implantation May 2013 for complete heart block. Echocardiogram at that time demonstrated an ejection fraction 55-60%  He doesn't think that he is feeling much better but there has been no syncope.  He has some orthostatic lightheadedness and chronic edema which has required ongoing use of diuretics  Past Medical History  Diagnosis Date  . Arthritis   . Hyperlipidemia   . DVT (deep venous thrombosis), unspecified laterality     a. previously on coumadin - d/c'd spring of 2013  . Colon polyp   . COPD (chronic obstructive pulmonary disease)   . Complete heart block     a. 05/2011 s/p SJM Model [id number] Dual Chamber PPM  . Hyperglycemia   . Diastolic CHF     a. 05/2011 Echo: EF 55-60%  . Steroid dependence   . Tobacco abuse     Past Surgical History  Procedure Date  . Bilateral inguinal hernia   . Pacemaker insertion May 2013    Current Outpatient Prescriptions  Medication Sig Dispense Refill  . albuterol (PROVENTIL HFA;VENTOLIN HFA) 108 (90 BASE) MCG/ACT inhaler Inhale 2 puffs into the lungs every 6 (six) hours as needed for wheezing.  1 Inhaler  2  . furosemide (LASIX) 20 MG tablet Take 1 tablet (20 mg total) by mouth daily.  90 tablet  3  . nitroGLYCERIN (NITROSTAT) 0.4 MG SL tablet Place 0.4 mg under the tongue every 5 (five) minutes as needed. For chest pain      . predniSONE (DELTASONE) 10 MG tablet Take 10 mg by mouth every morning.       Marland Kitchen RAPAFLO 8 MG CAPS capsule Take 8 mg by mouth daily with breakfast.       . simvastatin (ZOCOR) 40 MG tablet Take 40 mg by mouth every morning.         Allergies  Allergen Reactions  . Aspirin Nausea Only and Other (See Comments)    headache  . Tetanus Toxoids Other (See Comments)    Unspecified reaction  . Penicillins Swelling    Review of Systems negative except from HPI and PMH  Physical Exam BP 100/62  Pulse 67  Ht 5\' 9"  (1.753 m)  Wt 184 lb 12.8 oz (83.825  kg)  BMI 27.29 kg/m2 Well developed and well nourished in no acute distress HENT normal E scleral and icterus clear Neck Supple JVP flat; carotids brisk and full Device pocket well healed; without hematoma or erythema Clear to ausculation Regular rate and rhythm, no murmurs gallops or rub Soft with active bowel sounds No clubbing cyanosis 2+  Edema Alert and oriented, grossly normal motor and sensory function Skin Warm and Dry  Electrocardiogram demonstrates P. synchronous pacing   Assessment and  Plan

## 2011-09-07 NOTE — Patient Instructions (Addendum)
Your physician wants you to follow-up in: May of 2014 with Dr Graciela Husbands.  You will receive a reminder letter in the mail two months in advance. If you don't receive a letter, please call our office to schedule the follow-up appointment.  Your physician recommends that you continue on your current medications as directed. Please refer to the Current Medication list given to you today.

## 2012-03-13 ENCOUNTER — Encounter: Payer: Self-pay | Admitting: Cardiovascular Disease

## 2012-05-02 ENCOUNTER — Encounter: Payer: Self-pay | Admitting: Gastroenterology

## 2012-06-02 ENCOUNTER — Encounter: Payer: Self-pay | Admitting: Internal Medicine

## 2012-06-02 ENCOUNTER — Ambulatory Visit (INDEPENDENT_AMBULATORY_CARE_PROVIDER_SITE_OTHER): Payer: Medicare Other | Admitting: Internal Medicine

## 2012-06-02 VITALS — BP 132/64 | HR 64 | Ht 69.0 in | Wt 181.0 lb

## 2012-06-02 DIAGNOSIS — I5030 Unspecified diastolic (congestive) heart failure: Secondary | ICD-10-CM

## 2012-06-02 DIAGNOSIS — I442 Atrioventricular block, complete: Secondary | ICD-10-CM

## 2012-06-02 DIAGNOSIS — I4891 Unspecified atrial fibrillation: Secondary | ICD-10-CM

## 2012-06-02 LAB — PACEMAKER DEVICE OBSERVATION
AL AMPLITUDE: 5 mv
ATRIAL PACING PM: 22
BAMS-0001: 180 {beats}/min
BAMS-0003: 70 {beats}/min
DEVICE MODEL PM: 7321242
RV LEAD THRESHOLD: 0.625 V
VENTRICULAR PACING PM: 98

## 2012-06-02 NOTE — Patient Instructions (Addendum)
Please send a Merlin transmission on 09/04/12  Your physician wants you to follow-up in:  1 year with Dr. Graciela Husbands. You will receive a reminder letter in the mail two months in advance. If you don't receive a letter, please call our office to schedule the follow-up appointment.  Your physician recommends that you continue on your current medications as directed. Please refer to the Current Medication list given to you today.

## 2012-06-02 NOTE — Assessment & Plan Note (Signed)
He has had atrial fibrillation detected on his device with up to 3 hours. Is probably appropriate to consider anticoagulation. We have reviewed risks and benefits. He would like to discuss this a little bit more with Dr. Jacky Kindle with whom he has an appointment next month. I would suggest we consider a NOAC. We'll await assessment of renal function.

## 2012-06-02 NOTE — Progress Notes (Signed)
.  k[facility] Patient Care Team: Minda Meo, MD as PCP - General (Internal Medicine)   HPI  Lance Smith is a 79 y.o. male Seen following pacemaker implantation May 2013 for complete heart block. Echocardiogram at that time demonstrated an ejection fraction 55-60%   no syncope  Still smoking  Past Medical History  Diagnosis Date  . Arthritis   . Hyperlipidemia   . DVT (deep venous thrombosis), unspecified laterality     a. previously on coumadin - d/c'd spring of 2013  . Colon polyp   . COPD (chronic obstructive pulmonary disease)   . Complete heart block     a. 05/2011 s/p SJM Model [id number] Dual Chamber PPM  . Hyperglycemia   . Diastolic CHF     a. 05/2011 Echo: EF 55-60%  . Steroid dependence   . Tobacco abuse     Past Surgical History  Procedure Laterality Date  . Bilateral inguinal hernia    . Pacemaker insertion  May 2013    Current Outpatient Prescriptions  Medication Sig Dispense Refill  . albuterol (PROVENTIL HFA;VENTOLIN HFA) 108 (90 BASE) MCG/ACT inhaler Inhale 2 puffs into the lungs every 6 (six) hours as needed for wheezing.  1 Inhaler  2  . furosemide (LASIX) 20 MG tablet Take 1 tablet (20 mg total) by mouth daily.  90 tablet  3  . Multiple Vitamins-Minerals (CENTRUM SILVER ADULT 50+ PO) Take by mouth.      . nitroGLYCERIN (NITROSTAT) 0.4 MG SL tablet Place 0.4 mg under the tongue every 5 (five) minutes as needed. For chest pain      . predniSONE (DELTASONE) 10 MG tablet Take 10 mg by mouth every morning.       Marland Kitchen RAPAFLO 8 MG CAPS capsule Take 8 mg by mouth daily with breakfast.       . simvastatin (ZOCOR) 40 MG tablet Take 40 mg by mouth every morning.        No current facility-administered medications for this visit.    Allergies  Allergen Reactions  . Aspirin Nausea Only and Other (See Comments)    headache  . Tetanus Toxoids Other (See Comments)    Unspecified reaction  . Penicillins Swelling    Review of Systems negative except from HPI and  PMH  Physical Exam BP 132/64  Pulse 64  Ht 5\' 9"  (1.753 m)  Wt 181 lb (82.101 kg)  BMI 26.72 kg/m2  SpO2 92% Well developed and well nourished in no acute distress  Device pocket well healed; without hematoma or erythema  HENT normal Neck supple with JVP-flat Clear Regular rate and rhythm, no murmurs or gallops Abd-soft with active BS No Clubbing cyanosis edema Skin-warm and dry A & Oriented  Grossly normal sensory and motor function  ecg P-synchronous/ AV  pacing   Assessment and  Plan

## 2012-06-22 ENCOUNTER — Encounter: Payer: Self-pay | Admitting: Internal Medicine

## 2012-06-28 ENCOUNTER — Other Ambulatory Visit: Payer: Self-pay | Admitting: Nurse Practitioner

## 2012-09-04 ENCOUNTER — Encounter: Payer: Medicare Other | Admitting: *Deleted

## 2012-09-06 ENCOUNTER — Encounter: Payer: Self-pay | Admitting: *Deleted

## 2012-09-11 ENCOUNTER — Ambulatory Visit (INDEPENDENT_AMBULATORY_CARE_PROVIDER_SITE_OTHER): Payer: Medicare Other | Admitting: *Deleted

## 2012-09-11 ENCOUNTER — Encounter: Payer: Self-pay | Admitting: Internal Medicine

## 2012-09-11 DIAGNOSIS — Z950 Presence of cardiac pacemaker: Secondary | ICD-10-CM

## 2012-09-11 DIAGNOSIS — I442 Atrioventricular block, complete: Secondary | ICD-10-CM

## 2012-09-13 LAB — REMOTE PACEMAKER DEVICE
AL AMPLITUDE: 5 mv
AL IMPEDENCE PM: 400 Ohm
BAMS-0001: 180 {beats}/min
BAMS-0003: 70 {beats}/min
BRDY-0002RV: 60 {beats}/min
RV LEAD IMPEDENCE PM: 480 Ohm
VENTRICULAR PACING PM: 100

## 2012-10-11 ENCOUNTER — Encounter: Payer: Self-pay | Admitting: *Deleted

## 2012-10-13 ENCOUNTER — Other Ambulatory Visit: Payer: Self-pay

## 2012-10-13 MED ORDER — FUROSEMIDE 20 MG PO TABS: ORAL_TABLET | ORAL | Status: DC

## 2012-12-11 ENCOUNTER — Encounter: Payer: Self-pay | Admitting: Gastroenterology

## 2012-12-18 ENCOUNTER — Encounter (INDEPENDENT_AMBULATORY_CARE_PROVIDER_SITE_OTHER): Payer: Medicare Other | Admitting: *Deleted

## 2012-12-18 DIAGNOSIS — I442 Atrioventricular block, complete: Secondary | ICD-10-CM

## 2012-12-18 DIAGNOSIS — Z950 Presence of cardiac pacemaker: Secondary | ICD-10-CM

## 2012-12-21 ENCOUNTER — Encounter: Payer: Self-pay | Admitting: Internal Medicine

## 2012-12-21 DIAGNOSIS — I442 Atrioventricular block, complete: Secondary | ICD-10-CM

## 2012-12-21 LAB — MDC_IDC_ENUM_SESS_TYPE_REMOTE
Battery Remaining Longevity: 98 mo
Brady Statistic AP VS Percent: 1 %
Brady Statistic AS VS Percent: 1 %
Brady Statistic RV Percent Paced: 99 %
Lead Channel Impedance Value: 490 Ohm
Lead Channel Pacing Threshold Amplitude: 0.625 V
Lead Channel Pacing Threshold Pulse Width: 0.5 ms
Lead Channel Sensing Intrinsic Amplitude: 12 mV
Lead Channel Setting Pacing Amplitude: 0.875
Lead Channel Setting Pacing Amplitude: 2 V
Lead Channel Setting Pacing Pulse Width: 0.5 ms
Lead Channel Setting Sensing Sensitivity: 4 mV

## 2012-12-24 ENCOUNTER — Other Ambulatory Visit: Payer: Self-pay | Admitting: Internal Medicine

## 2013-01-15 ENCOUNTER — Other Ambulatory Visit (HOSPITAL_COMMUNITY): Payer: Self-pay | Admitting: Internal Medicine

## 2013-01-15 ENCOUNTER — Ambulatory Visit (HOSPITAL_COMMUNITY)
Admission: RE | Admit: 2013-01-15 | Discharge: 2013-01-15 | Disposition: A | Discharge status: Home / Self Care | Payer: Medicare Other | Source: Ambulatory Visit | Attending: Surgery | Admitting: Surgery

## 2013-01-15 DIAGNOSIS — I82890 Acute embolism and thrombosis of other specified veins: Secondary | ICD-10-CM | POA: Insufficient documentation

## 2013-01-15 DIAGNOSIS — Z86718 Personal history of other venous thrombosis and embolism: Secondary | ICD-10-CM

## 2013-01-15 DIAGNOSIS — I82819 Embolism and thrombosis of superficial veins of unspecified lower extremities: Secondary | ICD-10-CM | POA: Insufficient documentation

## 2013-01-15 DIAGNOSIS — M7989 Other specified soft tissue disorders: Secondary | ICD-10-CM

## 2013-01-15 DIAGNOSIS — M79609 Pain in unspecified limb: Secondary | ICD-10-CM

## 2013-01-15 DIAGNOSIS — I824Y9 Acute embolism and thrombosis of unspecified deep veins of unspecified proximal lower extremity: Secondary | ICD-10-CM | POA: Insufficient documentation

## 2013-01-16 ENCOUNTER — Encounter: Payer: Self-pay | Admitting: *Deleted

## 2013-01-16 ENCOUNTER — Telehealth: Payer: Self-pay | Admitting: Internal Medicine

## 2013-01-16 NOTE — Telephone Encounter (Signed)
Transmission was received and pt aware/kwm

## 2013-01-16 NOTE — Telephone Encounter (Signed)
New problem     Pt's wife called to say, pt had a Duplex done 12/29 and a blood clot was found from pt's Right leg groin area to toes.   Pt's wife would like a call back to know if a transmitting for December was received? She does not think it was working at the time.   And whether Device could have caused the blood clot?

## 2013-02-08 ENCOUNTER — Encounter: Payer: Medicare Other | Admitting: Gastroenterology

## 2013-03-21 ENCOUNTER — Encounter: Payer: Medicare Other | Admitting: *Deleted

## 2013-04-06 ENCOUNTER — Encounter: Payer: Self-pay | Admitting: *Deleted

## 2013-04-16 ENCOUNTER — Ambulatory Visit (INDEPENDENT_AMBULATORY_CARE_PROVIDER_SITE_OTHER): Payer: Commercial Managed Care - HMO | Admitting: *Deleted

## 2013-04-16 ENCOUNTER — Encounter: Payer: Self-pay | Admitting: Internal Medicine

## 2013-04-16 DIAGNOSIS — I442 Atrioventricular block, complete: Secondary | ICD-10-CM

## 2013-04-16 LAB — MDC_IDC_ENUM_SESS_TYPE_REMOTE
Battery Remaining Longevity: 103 mo
Battery Voltage: 2.95 V
Brady Statistic RA Percent Paced: 20 %
Date Time Interrogation Session: 20150330150103
Lead Channel Pacing Threshold Amplitude: 0.5 V
Lead Channel Pacing Threshold Pulse Width: 0.5 ms
Lead Channel Sensing Intrinsic Amplitude: 4.9 mV
Lead Channel Setting Pacing Amplitude: 0.75 V
Lead Channel Setting Pacing Pulse Width: 0.5 ms
Lead Channel Setting Sensing Sensitivity: 4 mV
MDC IDC MSMT LEADCHNL RA IMPEDANCE VALUE: 400 Ohm
MDC IDC MSMT LEADCHNL RV IMPEDANCE VALUE: 480 Ohm
MDC IDC MSMT LEADCHNL RV SENSING INTR AMPL: 12 mV
MDC IDC PG SERIAL: 7321242
MDC IDC SET LEADCHNL RA PACING AMPLITUDE: 2 V
MDC IDC STAT BRADY AP VP PERCENT: 21 %
MDC IDC STAT BRADY AP VS PERCENT: 1 %
MDC IDC STAT BRADY AS VP PERCENT: 77 %
MDC IDC STAT BRADY AS VS PERCENT: 1 %
MDC IDC STAT BRADY RV PERCENT PACED: 98 %

## 2013-05-02 ENCOUNTER — Encounter: Payer: Self-pay | Admitting: *Deleted

## 2013-06-12 ENCOUNTER — Encounter: Payer: Self-pay | Admitting: Internal Medicine

## 2013-06-12 ENCOUNTER — Ambulatory Visit (INDEPENDENT_AMBULATORY_CARE_PROVIDER_SITE_OTHER): Payer: Commercial Managed Care - HMO | Admitting: Internal Medicine

## 2013-06-12 VITALS — BP 117/69 | HR 75 | Ht 69.0 in | Wt 182.0 lb

## 2013-06-12 DIAGNOSIS — I4891 Unspecified atrial fibrillation: Secondary | ICD-10-CM

## 2013-06-12 DIAGNOSIS — R0789 Other chest pain: Secondary | ICD-10-CM

## 2013-06-12 DIAGNOSIS — I442 Atrioventricular block, complete: Secondary | ICD-10-CM

## 2013-06-12 DIAGNOSIS — Z950 Presence of cardiac pacemaker: Secondary | ICD-10-CM

## 2013-06-12 DIAGNOSIS — R0609 Other forms of dyspnea: Secondary | ICD-10-CM

## 2013-06-12 DIAGNOSIS — R0989 Other specified symptoms and signs involving the circulatory and respiratory systems: Secondary | ICD-10-CM

## 2013-06-12 LAB — MDC_IDC_ENUM_SESS_TYPE_INCLINIC
Battery Remaining Longevity: 122.4 mo
Brady Statistic RA Percent Paced: 20 %
Date Time Interrogation Session: 20150526113900
Implantable Pulse Generator Serial Number: 7321242
Lead Channel Impedance Value: 400 Ohm
Lead Channel Impedance Value: 462.5 Ohm
Lead Channel Pacing Threshold Amplitude: 0.625 V
Lead Channel Pacing Threshold Amplitude: 0.75 V
Lead Channel Pacing Threshold Pulse Width: 0.5 ms
Lead Channel Sensing Intrinsic Amplitude: 5 mV
Lead Channel Setting Pacing Amplitude: 0.875
Lead Channel Setting Pacing Amplitude: 2 V
Lead Channel Setting Pacing Pulse Width: 0.5 ms
MDC IDC MSMT BATTERY VOLTAGE: 2.95 V
MDC IDC MSMT LEADCHNL RA PACING THRESHOLD AMPLITUDE: 0.75 V
MDC IDC MSMT LEADCHNL RA PACING THRESHOLD PULSEWIDTH: 0.5 ms
MDC IDC MSMT LEADCHNL RA PACING THRESHOLD PULSEWIDTH: 0.5 ms
MDC IDC MSMT LEADCHNL RV SENSING INTR AMPL: 12 mV
MDC IDC SET LEADCHNL RV SENSING SENSITIVITY: 4 mV
MDC IDC STAT BRADY RV PERCENT PACED: 98 %

## 2013-06-12 NOTE — Progress Notes (Signed)
Patient Care Team: Geoffery Lyons, MD as PCP - General (Internal Medicine)   HPI  Lance Smith is a 80 y.o. male Seen following pacemaker implantation May 2013 for complete heart block. Echocardiogram at that time demonstrated an ejection fraction 55-60%  no syncope Still smoking  He has a chest discomfort. This typically lasts just minutes. It is not necessarily associated with exercise. There is no associated radiation. He has progressive, albeit slowly, dyspnea on exertion. It has been many years since he has had a stress test.  He also has had a recurrent lower extremity DVT; he says this is #3; he is back on anticoagulation with Rivaroxaban  He has significant daytime somnolence and obstructive breathing patterns at night Past Medical History  Diagnosis Date  . Arthritis   . Hyperlipidemia   . DVT (deep venous thrombosis), unspecified laterality     a. previously on coumadin - d/c'd spring of 2013  . Colon polyp   . COPD (chronic obstructive pulmonary disease)   . Complete heart block     a. 05/2011 s/p SJM Model [id number] Dual Chamber PPM  . Hyperglycemia   . Diastolic CHF     a. 04/8544 Echo: EF 55-60%  . Steroid dependence   . Tobacco abuse     Past Surgical History  Procedure Laterality Date  . Bilateral inguinal hernia    . Pacemaker insertion  May 2013    Current Outpatient Prescriptions  Medication Sig Dispense Refill  . furosemide (LASIX) 20 MG tablet TAKE ONE TABLET BY MOUTH DAILY  90 tablet  2  . Multiple Vitamins-Minerals (CENTRUM SILVER ADULT 50+ PO) Take by mouth.      . nitroGLYCERIN (NITROSTAT) 0.4 MG SL tablet Place 0.4 mg under the tongue every 5 (five) minutes as needed. For chest pain      . predniSONE (DELTASONE) 10 MG tablet Take 10 mg by mouth every morning.       Marland Kitchen RAPAFLO 8 MG CAPS capsule Take 8 mg by mouth daily with breakfast.       . simvastatin (ZOCOR) 40 MG tablet Take 40 mg by mouth every morning.       Alveda Reasons 20 MG TABS  tablet daily.      Marland Kitchen albuterol (PROVENTIL HFA;VENTOLIN HFA) 108 (90 BASE) MCG/ACT inhaler Inhale 2 puffs into the lungs every 6 (six) hours as needed for wheezing.  1 Inhaler  2   No current facility-administered medications for this visit.    Allergies  Allergen Reactions  . Aspirin Nausea Only and Other (See Comments)    headache  . Tetanus Toxoids Other (See Comments)    Unspecified reaction  . Penicillins Swelling    Review of Systems negative except from HPI and PMH  Physical Exam BP 117/69  Pulse 75  Ht 5\' 9"  (1.753 m)  Wt 182 lb (82.555 kg)  BMI 26.86 kg/m2 Well developed and well nourished in no acute distress HENT normal E scleral and icterus clear Neck Supple JVP flat; carotids brisk and full Clear but decreased Regular rate and rhythm, no murmurs gallops or rub Soft with active bowel sounds No clubbing cyanosis  Edema Alert and oriented, grossly normal motor and sensory function voice raspy Skin Warm and Dry  ECG demonstrates atrially paced rhythm with frequent PACs and right bundle branch left axis conduction  Assessment and  Plan  Complete heart block  Pacemaker-St. Jude  DVT-recurrent  Daytime somnolence/obstructive nocturnal breathing  Paroxysmal atrial  arrhythmia  Permit rhythm point of view Lance Smith is stable. His device is functioning normally. There are some atrial arrhythmias, however, because of DVT he is already on anticoagulation.  His daytime somnolence suggested he might benefit from a sleep study. I will relay this suggested to Dr. Reynaldo Minium. We'll see him again in one year.

## 2013-06-12 NOTE — Patient Instructions (Addendum)
Remote monitoring is used to monitor your pacemaker from home. This monitoring reduces the number of office visits required to check your device to one time per year. It allows [place] to keep an eye on the functioning of your device to ensure it is working properly. You are scheduled for a device check from home on 09-13-2013. You may send your transmission at any time that day. If you have a wireless device, the transmission will be sent automatically. After your physician reviews your transmission, you will receive a postcard with your next transmission date.  Your physician recommends that you schedule a follow-up appointment in: 12 months with Dr.Klein  Your physician has requested that you have a lexiscan myoview. For further information please visit [email or web address]. Please follow instruction sheet, as given.

## 2013-06-25 ENCOUNTER — Ambulatory Visit (HOSPITAL_COMMUNITY): Payer: Medicare HMO | Attending: Internal Medicine | Admitting: Radiology

## 2013-06-25 VITALS — BP 111/60 | Ht 69.0 in | Wt 179.0 lb

## 2013-06-25 DIAGNOSIS — F17200 Nicotine dependence, unspecified, uncomplicated: Secondary | ICD-10-CM | POA: Insufficient documentation

## 2013-06-25 DIAGNOSIS — R079 Chest pain, unspecified: Secondary | ICD-10-CM

## 2013-06-25 DIAGNOSIS — R0602 Shortness of breath: Secondary | ICD-10-CM

## 2013-06-25 DIAGNOSIS — R0989 Other specified symptoms and signs involving the circulatory and respiratory systems: Secondary | ICD-10-CM | POA: Insufficient documentation

## 2013-06-25 DIAGNOSIS — R0789 Other chest pain: Secondary | ICD-10-CM | POA: Insufficient documentation

## 2013-06-25 DIAGNOSIS — I4891 Unspecified atrial fibrillation: Secondary | ICD-10-CM

## 2013-06-25 DIAGNOSIS — I4949 Other premature depolarization: Secondary | ICD-10-CM

## 2013-06-25 DIAGNOSIS — R0609 Other forms of dyspnea: Secondary | ICD-10-CM | POA: Insufficient documentation

## 2013-06-25 MED ORDER — REGADENOSON 0.4 MG/5ML IV SOLN
0.4000 mg | Freq: Once | INTRAVENOUS | Status: AC
  Administered 2013-06-25: 0.4 mg via INTRAVENOUS

## 2013-06-25 MED ORDER — TECHNETIUM TC 99M SESTAMIBI GENERIC - CARDIOLITE
30.0000 | Freq: Once | INTRAVENOUS | Status: AC | PRN
  Administered 2013-06-25: 30 via INTRAVENOUS

## 2013-06-25 MED ORDER — TECHNETIUM TC 99M SESTAMIBI GENERIC - CARDIOLITE
10.0000 | Freq: Once | INTRAVENOUS | Status: AC | PRN
  Administered 2013-06-25: 10 via INTRAVENOUS

## 2013-06-25 NOTE — Progress Notes (Signed)
[facility] [address] [place], [place] [address] [phone]    Cardiology Nuclear Med Study  Lance Smith is a 80 y.o. male     MRN : [id number]     DOB: 06/10/1931  Procedure Date: 06/25/2013  Nuclear Med Background Indication for Stress Test:  Evaluation for Ischemia History:  '10 MPI: EF=38% and normal perfusion;'13 PTVP,Echo: EF=55-60% Cardiac Risk Factors: Lipids and Smoker  Symptoms:  Chest Pain (last date of chest discomfort one week ago) and DOE   Nuclear Pre-Procedure Caffeine/Decaff Intake:  None> 12 hrs NPO After: 4:30pm   Lungs:  clear O2 Sat: 96% on room air. IV 0.9% NS with Angio Cath:  22g  IV Site: R Hand x 1, tolerated well IV Started by:  Irven Baltimore, RN  Chest Size (in):  42 Cup Size: n/a  Height: 5\' 9"  (1.753 m)  Weight:  179 lb (81.194 kg)  BMI:  Body mass index is 26.42 kg/(m^2). Tech Comments:  N/A    Nuclear Med Study 1 or 2 day study: 1 day  Stress Test Type:  Lexiscan  Reading MD: N/A  Order Authorizing Provider:  Virl Axe, MD  Resting Radionuclide: Technetium 24m Sestamibi  Resting Radionuclide Dose: 11.0 mCi   Stress Radionuclide:  Technetium 53m Sestamibi  Stress Radionuclide Dose: 33.0 mCi           Stress Protocol Rest HR: 60 Stress HR: 76  Rest BP: 111/60 Stress BP: 116/66  Exercise Time (min): n/a METS: n/a   Predicted Max HR: 140 bpm % Max HR: 54.29 bpm Rate Pressure Product: 8816   Dose of Adenosine (mg):  n/a Dose of Lexiscan: 0.4 mg  Dose of Atropine (mg): n/a Dose of Dobutamine: n/a mcg/kg/min (at max HR)  Stress Test Technologist: Matilde Haymaker, RN  Nuclear Technologist:  Charlton Amor, CNMT     Rest Procedure:  Myocardial perfusion imaging was performed at rest 45 minutes following the intravenous administration of Technetium 14m Sestamibi. Rest ECG: NSR, v-paced.   Stress Procedure:  The patient received IV Lexiscan 0.4 mg over 15-seconds.  Technetium 42m Sestamibi injected  at 30-seconds. Patient had chest tightness 3/10 with infusion and relieved in recovery. Quantitative spect images were obtained after a 45 minute delay. Stress ECG: No significant change from baseline ECG  QPS Raw Data Images:  Normal; no motion artifact; normal heart/lung ratio. Stress Images:  Medium-sized, moderate basal to mid inferior and inferoseptal perfusion defect.  Small, mild mid-inferolateral perfusion defect.  Rest Images:  Medium-sized, moderate basal to mid inferior and inferoseptal perfusion defect.  Small, mild mid-inferolateral perfusion defect. Subtraction (SDS):  Fixed medium-sized, moderate basal to mid inferior and inferoseptal perfusion defect.  Fixed small, mild mid-inferolateral perfusion defect. Transient Ischemic Dilatation (Normal <1.22):  1.12 Lung/Heart Ratio (Normal <0.45):  0.29  Quantitative Gated Spect Images QGS EDV:  136 ml QGS ESV:  67 ml  Impression Exercise Capacity:  Lexiscan with no exercise. BP Response:  Normal blood pressure response. Clinical Symptoms:  Chest tightness, lightheaded.  ECG Impression:  NSR, v-paced.  Comparison with Prior Nuclear Study: Prior perfusion images described as normal.   Overall Impression:  Low risk stress nuclear study with a fixed, medium-sized moderate basal inferior and inferoseptal perfusion defect and a fixed small, mild mid inferolateral perfusion defect.  There was no ischemia (SDS 1).  Given relatively normal wall motion in the areas of the defects, cannot rule out attenuation. .  LV Ejection Fraction: 51%.  LV Wall Motion:  Apical hypokinesis.   Larey Dresser 06/25/2013

## 2013-07-13 ENCOUNTER — Encounter (HOSPITAL_COMMUNITY): Payer: Self-pay | Admitting: Emergency Medicine

## 2013-07-13 ENCOUNTER — Emergency Department (HOSPITAL_COMMUNITY)
Admission: EM | Admit: 2013-07-13 | Discharge: 2013-07-13 | Disposition: A | Discharge status: Home / Self Care | Payer: Medicare HMO | Attending: Emergency Medicine | Admitting: Emergency Medicine

## 2013-07-13 DIAGNOSIS — Z86718 Personal history of other venous thrombosis and embolism: Secondary | ICD-10-CM | POA: Insufficient documentation

## 2013-07-13 DIAGNOSIS — R7309 Other abnormal glucose: Secondary | ICD-10-CM | POA: Insufficient documentation

## 2013-07-13 DIAGNOSIS — M129 Arthropathy, unspecified: Secondary | ICD-10-CM | POA: Insufficient documentation

## 2013-07-13 DIAGNOSIS — IMO0002 Reserved for concepts with insufficient information to code with codable children: Secondary | ICD-10-CM | POA: Insufficient documentation

## 2013-07-13 DIAGNOSIS — D126 Benign neoplasm of colon, unspecified: Secondary | ICD-10-CM | POA: Insufficient documentation

## 2013-07-13 DIAGNOSIS — F17200 Nicotine dependence, unspecified, uncomplicated: Secondary | ICD-10-CM | POA: Insufficient documentation

## 2013-07-13 DIAGNOSIS — R197 Diarrhea, unspecified: Secondary | ICD-10-CM | POA: Insufficient documentation

## 2013-07-13 DIAGNOSIS — J449 Chronic obstructive pulmonary disease, unspecified: Secondary | ICD-10-CM | POA: Insufficient documentation

## 2013-07-13 DIAGNOSIS — R110 Nausea: Secondary | ICD-10-CM | POA: Insufficient documentation

## 2013-07-13 DIAGNOSIS — I442 Atrioventricular block, complete: Secondary | ICD-10-CM | POA: Insufficient documentation

## 2013-07-13 DIAGNOSIS — I5030 Unspecified diastolic (congestive) heart failure: Secondary | ICD-10-CM | POA: Insufficient documentation

## 2013-07-13 DIAGNOSIS — Z79899 Other long term (current) drug therapy: Secondary | ICD-10-CM | POA: Insufficient documentation

## 2013-07-13 DIAGNOSIS — J4489 Other specified chronic obstructive pulmonary disease: Secondary | ICD-10-CM | POA: Insufficient documentation

## 2013-07-13 DIAGNOSIS — E785 Hyperlipidemia, unspecified: Secondary | ICD-10-CM | POA: Insufficient documentation

## 2013-07-13 LAB — CBC
HCT: 41.4 % (ref 39.0–52.0)
HEMOGLOBIN: 14.1 g/dL (ref 13.0–17.0)
MCH: 32.3 pg (ref 26.0–34.0)
MCHC: 34.1 g/dL (ref 30.0–36.0)
MCV: 94.7 fL (ref 78.0–100.0)
Platelets: 203 10*3/uL (ref 150–400)
RBC: 4.37 MIL/uL (ref 4.22–5.81)
RDW: 12.7 % (ref 11.5–15.5)
WBC: 9.3 10*3/uL (ref 4.0–10.5)

## 2013-07-13 LAB — URINALYSIS, ROUTINE W REFLEX MICROSCOPIC
BILIRUBIN URINE: NEGATIVE
Glucose, UA: NEGATIVE mg/dL
KETONES UR: NEGATIVE mg/dL
Nitrite: NEGATIVE
Protein, ur: NEGATIVE mg/dL
SPECIFIC GRAVITY, URINE: 1.01 (ref 1.005–1.030)
UROBILINOGEN UA: 0.2 mg/dL (ref 0.0–1.0)
pH: 6 (ref 5.0–8.0)

## 2013-07-13 LAB — COMPREHENSIVE METABOLIC PANEL
ALT: 20 U/L (ref 0–53)
AST: 26 U/L (ref 0–37)
Albumin: 3 g/dL — ABNORMAL LOW (ref 3.5–5.2)
Alkaline Phosphatase: 73 U/L (ref 39–117)
BUN: 13 mg/dL (ref 6–23)
CO2: 24 meq/L (ref 19–32)
Calcium: 8.7 mg/dL (ref 8.4–10.5)
Chloride: 98 mEq/L (ref 96–112)
Creatinine, Ser: 1.36 mg/dL — ABNORMAL HIGH (ref 0.50–1.35)
GFR, EST AFRICAN AMERICAN: 55 mL/min — AB (ref >90)
GFR, EST NON AFRICAN AMERICAN: 48 mL/min — AB (ref >90)
GLUCOSE: 137 mg/dL — AB (ref 70–99)
Potassium: 3.8 mEq/L (ref 3.7–5.3)
Sodium: 139 mEq/L (ref 137–147)
Total Bilirubin: 0.5 mg/dL (ref 0.3–1.2)
Total Protein: 6.5 g/dL (ref 6.0–8.3)

## 2013-07-13 LAB — URINE MICROSCOPIC-ADD ON

## 2013-07-13 LAB — CLOSTRIDIUM DIFFICILE BY PCR: Toxigenic C. Difficile by PCR: NEGATIVE

## 2013-07-13 MED ORDER — SODIUM CHLORIDE 0.9 % IV BOLUS (SEPSIS)
1000.0000 mL | Freq: Once | INTRAVENOUS | Status: AC
  Administered 2013-07-13: 1000 mL via INTRAVENOUS

## 2013-07-13 MED ORDER — SODIUM CHLORIDE 0.9 % IV BOLUS (SEPSIS)
500.0000 mL | Freq: Once | INTRAVENOUS | Status: AC
  Administered 2013-07-13: 500 mL via INTRAVENOUS

## 2013-07-13 MED ORDER — HYDROMORPHONE HCL PF 1 MG/ML IJ SOLN: 1.0000 mg | Freq: Once | INTRAMUSCULAR | Status: DC

## 2013-07-13 NOTE — ED Notes (Signed)
MD at bedside. 

## 2013-07-13 NOTE — ED Notes (Signed)
Pt given water 

## 2013-07-13 NOTE — ED Notes (Signed)
Pt has had diarrhea for the past 7 days. Pt was told he had an infection. Pt was given an antibiotic. Pt c/o nausea. Pt vomitted on Sunday. Pt has not had any vomitting since. Pt c/o weakness and dizziness. Pt denies chest pain. NAD noted. Family at bedside.

## 2013-07-13 NOTE — Discharge Instructions (Signed)
Rest. Drink plenty of fluids. You may try imodium as need. We sent a stool culture the results of which should be back in the next 1-2 days. Follow up with your doctor this Monday if symptoms fail to improve/resolve. Return to ER right away if worse, abdominal pain, persistent vomiting, weak/faint, fevers, other concern.   Diarrhea Diarrhea is frequent loose and watery bowel movements. It can cause you to feel weak and dehydrated. Dehydration can cause you to become tired and thirsty, have a dry mouth, and have decreased urination that often is dark yellow. Diarrhea is a sign of another problem, most often an infection that will not last long. In most cases, diarrhea typically lasts 2-3 days. However, it can last longer if it is a sign of something more serious. It is important to treat your diarrhea as directed by your caregive to lessen or prevent future episodes of diarrhea. CAUSES  Some common causes include:  Gastrointestinal infections caused by viruses, bacteria, or parasites.  Food poisoning or food allergies.  Certain medicines, such as antibiotics, chemotherapy, and laxatives.  Artificial sweeteners and fructose.  Digestive disorders. HOME CARE INSTRUCTIONS  Ensure adequate fluid intake (hydration): have 1 cup (8 oz) of fluid for each diarrhea episode. Avoid fluids that contain simple sugars or sports drinks, fruit juices, whole milk products, and sodas. Your urine should be clear or pale yellow if you are drinking enough fluids. Hydrate with an oral rehydration solution that you can purchase at pharmacies, retail stores, and online. You can prepare an oral rehydration solution at home by mixing the following ingredients together:   - tsp table salt.   tsp baking soda.   tsp salt substitute containing potassium chloride.  1  tablespoons sugar.  1 L (34 oz) of water.  Certain foods and beverages may increase the speed at which food moves through the gastrointestinal (GI)  tract. These foods and beverages should be avoided and include:  Caffeinated and alcoholic beverages.  High-fiber foods, such as raw fruits and vegetables, nuts, seeds, and whole grain breads and cereals.  Foods and beverages sweetened with sugar alcohols, such as xylitol, sorbitol, and mannitol.  Some foods may be well tolerated and may help thicken stool including:  Starchy foods, such as rice, toast, pasta, low-sugar cereal, oatmeal, grits, baked potatoes, crackers, and bagels.  Bananas.  Applesauce.  Add probiotic-rich foods to help increase healthy bacteria in the GI tract, such as yogurt and fermented milk products.  Wash your hands well after each diarrhea episode.  Only take over-the-counter or prescription medicines as directed by your caregiver.  Take a warm bath to relieve any burning or pain from frequent diarrhea episodes. SEEK IMMEDIATE MEDICAL CARE IF:   You are unable to keep fluids down.  You have persistent vomiting.  You have blood in your stool, or your stools are black and tarry.  You do not urinate in 6-8 hours, or there is only a small amount of very dark urine.  You have abdominal pain that increases or localizes.  You have weakness, dizziness, confusion, or lightheadedness.  You have a severe headache.  Your diarrhea gets worse or does not get better.  You have a fever or persistent symptoms for more than 2-3 days.  You have a fever and your symptoms suddenly get worse. MAKE SURE YOU:   Understand these instructions.  Will watch your condition.  Will get help right away if you are not doing well or get worse. Document Released: 12/25/2001 Document  Revised: 12/22/2011 Document Reviewed: 09/12/2011 [facility] Patient Information 2015 [place], [place]. This information is not intended to replace advice given to you by your health care provider. Make sure you discuss any questions you have with your health care provider.

## 2013-07-13 NOTE — ED Notes (Addendum)
Pt. Ambulated to RR. Patient family updated on plan of care and wait time, family informed to keep staff updated about when she needs to leave. Family agreeable to plan.

## 2013-07-13 NOTE — ED Provider Notes (Signed)
CSN: [id number]     Arrival date & time 07/13/13  1104 History   First MD Initiated Contact with Patient 07/13/13 1128     Chief Complaint  Patient presents with  . Diarrhea     (Consider location/radiation/quality/duration/timing/severity/associated sxs/prior Treatment) Patient is a 80 y.o. male presenting with diarrhea. The history is provided by the patient and the spouse.  Diarrhea Associated symptoms: no abdominal pain, no chills, no fever, no headaches and no vomiting   pt with multiple diarrhea stools for the past 5-6 days. Several episodes today, small volume, watery. No bloody stools. Denies abd pain. Nausea. No vomiting. Denies fever or chills. No faintness. No known ill contacts, recent travel, or bad food ingestion. Saw pcp w same and given cipro to treat the diarrhea - no prior abx tx.  w above symptoms, denies specific exacerbating or alleviating factors. No hx recurrent or chronic diarrhea. Pt unaware of wt loss. No gu c/o, making normal urine.     Past Medical History  Diagnosis Date  . Arthritis   . Hyperlipidemia   . DVT (deep venous thrombosis), unspecified laterality     a. previously on coumadin - d/c'd spring of 2013  . Colon polyp   . COPD (chronic obstructive pulmonary disease)   . Complete heart block     a. 05/2011 s/p SJM Model [id number] Dual Chamber PPM  . Hyperglycemia   . Diastolic CHF     a. 0/2774 Echo: EF 55-60%  . Steroid dependence   . Tobacco abuse    Past Surgical History  Procedure Laterality Date  . Bilateral inguinal hernia    . Pacemaker insertion  May 2013   No family history on file. History  Substance Use Topics  . Smoking status: Current Every Day Smoker -- 2.00 packs/day    Types: Cigarettes  . Smokeless tobacco: Never Used  . Alcohol Use: No    Review of Systems  Constitutional: Negative for fever and chills.  HENT: Negative for sore throat.   Eyes: Negative for redness.  Respiratory: Negative for cough and shortness of  breath.   Cardiovascular: Negative for chest pain.  Gastrointestinal: Positive for nausea and diarrhea. Negative for vomiting, abdominal pain and abdominal distention.  Genitourinary: Negative for dysuria and flank pain.  Musculoskeletal: Negative for back pain and neck pain.  Skin: Negative for rash.  Neurological: Negative for headaches.  Hematological: Does not bruise/bleed easily.  Psychiatric/Behavioral: Negative for confusion.      Allergies  Aspirin; Tetanus toxoids; and Penicillins  Home Medications   Prior to Admission medications   Medication Sig Start Date End Date Taking? Authorizing Provider  albuterol (PROVENTIL HFA;VENTOLIN HFA) 108 (90 BASE) MCG/ACT inhaler Inhale 2 puffs into the lungs every 6 (six) hours as needed for wheezing. 05/24/11 06/02/12  Evelene Croon Barrett, PA-C  furosemide (LASIX) 20 MG tablet TAKE ONE TABLET BY MOUTH DAILY 10/13/12   Deboraha Sprang, MD  Multiple Vitamins-Minerals (CENTRUM SILVER ADULT 50+ PO) Take by mouth.    Historical Provider, MD  nitroGLYCERIN (NITROSTAT) 0.4 MG SL tablet Place 0.4 mg under the tongue every 5 (five) minutes as needed. For chest pain    Historical Provider, MD  predniSONE (DELTASONE) 10 MG tablet Take 10 mg by mouth every morning.  07/15/10   Historical Provider, MD  RAPAFLO 8 MG CAPS capsule Take 8 mg by mouth daily with breakfast.  06/22/11   Historical Provider, MD  simvastatin (ZOCOR) 40 MG tablet Take 40 mg by mouth every  morning.  07/15/10   Historical Provider, MD  XARELTO 20 MG TABS tablet daily. 05/10/13   Historical Provider, MD   There were no vitals taken for this visit. Physical Exam  Nursing note and vitals reviewed. Constitutional: He is oriented to person, place, and time. He appears well-developed and well-nourished. No distress.  HENT:  Mouth/Throat: Oropharynx is clear and moist.  Eyes: Conjunctivae are normal. No scleral icterus.  Neck: Neck supple. No tracheal deviation present.  Cardiovascular: Normal  rate, regular rhythm, normal heart sounds and intact distal pulses.   Pulmonary/Chest: Effort normal and breath sounds normal. No accessory muscle usage. No respiratory distress.  Abdominal: Soft. Bowel sounds are normal. He exhibits no distension and no mass. There is no tenderness. There is no rebound and no guarding.  No incarc hernia.   Genitourinary:  No cva tenderness  Musculoskeletal: Normal range of motion. He exhibits no edema and no tenderness.  Neurological: He is alert and oriented to person, place, and time.  Skin: Skin is warm and dry.  Psychiatric: He has a normal mood and affect.    ED Course  Procedures (including critical care time) Labs Review  Results for orders placed during the hospital encounter of 07/13/13  CLOSTRIDIUM DIFFICILE BY PCR      Result Value Ref Range   C difficile by pcr NEGATIVE  NEGATIVE  CBC      Result Value Ref Range   WBC 9.3  4.0 - 10.5 K/uL   RBC 4.37  4.22 - 5.81 MIL/uL   Hemoglobin 14.1  13.0 - 17.0 g/dL   HCT 41.4  39.0 - 52.0 %   MCV 94.7  78.0 - 100.0 fL   MCH 32.3  26.0 - 34.0 pg   MCHC 34.1  30.0 - 36.0 g/dL   RDW 12.7  11.5 - 15.5 %   Platelets 203  150 - 400 K/uL  COMPREHENSIVE METABOLIC PANEL      Result Value Ref Range   Sodium 139  137 - 147 mEq/L   Potassium 3.8  3.7 - 5.3 mEq/L   Chloride 98  96 - 112 mEq/L   CO2 24  19 - 32 mEq/L   Glucose, Bld 137 (*) 70 - 99 mg/dL   BUN 13  6 - 23 mg/dL   Creatinine, Ser 1.36 (*) 0.50 - 1.35 mg/dL   Calcium 8.7  8.4 - 10.5 mg/dL   Total Protein 6.5  6.0 - 8.3 g/dL   Albumin 3.0 (*) 3.5 - 5.2 g/dL   AST 26  0 - 37 U/L   ALT 20  0 - 53 U/L   Alkaline Phosphatase 73  39 - 117 U/L   Total Bilirubin 0.5  0.3 - 1.2 mg/dL   GFR calc non Af Amer 48 (*) >90 mL/min   GFR calc Af Amer 55 (*) >90 mL/min  URINALYSIS, ROUTINE W REFLEX MICROSCOPIC      Result Value Ref Range   Color, Urine YELLOW  YELLOW   APPearance CLEAR  CLEAR   Specific Gravity, Urine 1.010  1.005 - 1.030   pH  6.0  5.0 - 8.0   Glucose, UA NEGATIVE  NEGATIVE mg/dL   Hgb urine dipstick MODERATE (*) NEGATIVE   Bilirubin Urine NEGATIVE  NEGATIVE   Ketones, ur NEGATIVE  NEGATIVE mg/dL   Protein, ur NEGATIVE  NEGATIVE mg/dL   Urobilinogen, UA 0.2  0.0 - 1.0 mg/dL   Nitrite NEGATIVE  NEGATIVE   Leukocytes, UA TRACE (*) NEGATIVE  URINE MICROSCOPIC-ADD ON      Result Value Ref Range   Squamous Epithelial / LPF RARE  RARE   WBC, UA 0-2  <3 WBC/hpf   RBC / HPF 3-6  <3 RBC/hpf   Bacteria, UA RARE  RARE      MDM  Iv ns bolus. Labs.  Reviewed nursing notes and prior charts for additional history.   Additional 500 cc ns bolus.  Pt tolerating po fluids.  abd soft nt on recheck. No pain. No nv.   Pt requests d/c, feels improved.     Mirna Mires, MD 07/13/13 [phone]

## 2013-07-16 ENCOUNTER — Ambulatory Visit (INDEPENDENT_AMBULATORY_CARE_PROVIDER_SITE_OTHER): Payer: Commercial Managed Care - HMO | Admitting: Physician Assistant

## 2013-07-16 ENCOUNTER — Encounter: Payer: Self-pay | Admitting: Physician Assistant

## 2013-07-16 ENCOUNTER — Telehealth: Payer: Self-pay | Admitting: Gastroenterology

## 2013-07-16 VITALS — BP 112/64 | HR 66 | Ht 69.0 in | Wt 182.0 lb

## 2013-07-16 DIAGNOSIS — R197 Diarrhea, unspecified: Secondary | ICD-10-CM

## 2013-07-16 DIAGNOSIS — Z860100 Personal history of colon polyps, unspecified: Secondary | ICD-10-CM

## 2013-07-16 DIAGNOSIS — Z860101 Personal history of adenomatous and serrated colon polyps: Secondary | ICD-10-CM | POA: Insufficient documentation

## 2013-07-16 DIAGNOSIS — R935 Abnormal findings on diagnostic imaging of other abdominal regions, including retroperitoneum: Secondary | ICD-10-CM

## 2013-07-16 NOTE — Patient Instructions (Addendum)
We will call once we hear from Dr. Reynaldo Minium regarding the Xarelto blood thinner.   You have been scheduled for a colonoscopy. Please follow written instructions given to you at your visit today.  We have given you a sample of the colonoscopy prep.   If you use inhalers (even only as needed), please bring them with you on the day of your procedure. Your physician has requested that you go to www.startemmi.com and enter the access code given to you at your visit today. This web site gives a general overview about your procedure. However, you should still follow specific instructions given to you by our office regarding your preparation for the procedure.  We have given you samples of Restora. Take 1 tablet daily. You can get this at [facility].  Take for 2-3 weeks.

## 2013-07-16 NOTE — Telephone Encounter (Signed)
Pt seen in the ER over the weekend for diarrhea, had to receive IV fluids. Wife states they were told he needed to have a colon done. Pt scheduled to see Nicoletta Ba PA today at 1:30pm. Wife aware of appt.

## 2013-07-16 NOTE — Progress Notes (Signed)
Subjective:    Patient ID: Lance Smith, male    DOB: [birth year], 80 y.o.   MRN: [id number]  HPI  Lance Smith is a pleasant 80 year old white male Lance Smith known to Dr. Fuller Plan. He was last seen he had colonoscopy in August of 2012 that showed multiple small polyps all of which were removed and mild diverticulosis of the left colon as well as internal hemorrhoids.  Path on  polyps consistent with tubular adenomas, and he was advised to followup in 3 years. Patient comes in today because it is time to schedule followup colonoscopy and also because he has been having an acute diarrheal illness. He says he initially had onset of symptoms a couple of months ago noticing looser stools and some vague discomfort in his lower abdomen bilaterally. At that time he was having 2-3 soft to loose stools per day. About a week ago he had the abrupt worsening of his symptoms and was having multiple watery stools per day. He says at its worst she was having a bowel movement every 30-45 minutes. He really did not have any abdominal pain or cramping with this no bleeding he did have some low-grade fevers chills and sweats as well as of vague nausea no vomiting. He was seen in Dr. Reynaldo Minium ' office and was given a course of Cipro the take for 5 days. He also had a CT scan of the abdomen and pelvis done through [facility] on 6/23/ 15 . We obtained a copy of this report i- showed mild diffuse inflammatory changes throughout the colon consistent with nonspecific colitis most likely infectious right inguinal hernia and small renal cysts. He says after completing a course of antibiotics he is definitely feeling better ,has no complaints of abdominal pain currently and has been having a couple of soft stools per day over the past couple of days. His appetite has improved has not had any further fevers or chills.  Stool for C. difficile and culture are both negative. He has not been on any recent antibiotics. His history is pertinent for  DVTs, last had a DVT in January of 2015 and is being maintained on Xarelto.Other problems include history of heart block status post pacemaker placement he a 55-60% and COPD.    Review of Systems  Constitutional: Positive for appetite change.  HENT: Negative.   Eyes: Negative.   Respiratory: Negative.   Cardiovascular: Negative.   Gastrointestinal: Positive for abdominal pain and diarrhea.  Endocrine: Negative.   Genitourinary: Negative.   Musculoskeletal: Negative.   Skin: Negative.   Allergic/Immunologic: Negative.   Neurological: Negative.   Hematological: Negative.   Psychiatric/Behavioral: Negative.    Outpatient Prescriptions Prior to Visit  Medication Sig Dispense Refill  . furosemide (LASIX) 40 MG tablet Take 40 mg by mouth daily.      . mometasone-formoterol (DULERA) 200-5 MCG/ACT AERO Inhale 2 puffs into the lungs 2 (two) times daily as needed for wheezing or shortness of breath.       . Multiple Vitamins-Minerals (CENTRUM SILVER ADULT 50+ PO) Take 1 tablet by mouth daily.       . nitroGLYCERIN (NITROSTAT) 0.4 MG SL tablet Place 0.4 mg under the tongue every 5 (five) minutes as needed. For chest pain      . ondansetron (ZOFRAN) 4 MG tablet Take 4 mg by mouth every 6 (six) hours as needed for nausea or vomiting.      . predniSONE (DELTASONE) 10 MG tablet Take 10 mg by mouth every morning.       Marland Kitchen  RAPAFLO 8 MG CAPS capsule Take 8 mg by mouth daily with breakfast.       . simvastatin (ZOCOR) 40 MG tablet Take 40 mg by mouth every morning.       Lance Smith Reasons 20 MG TABS tablet Take 20 mg by mouth daily.       . ciprofloxacin (CIPRO) 500 MG tablet Take 500 mg by mouth every 4 (four) hours.       No facility-administered medications prior to visit.   Allergies  Allergen Reactions  . Aspirin Nausea Only and Other (See Comments)    headache  . Tetanus Toxoids Other (See Comments)    Unspecified reaction  . Penicillins Swelling   Patient Active Problem List   Diagnosis Date  Noted  . Hx of adenomatous colonic polyps 07/16/2013  . Atrial fibrillation 06/02/2012  . Diastolic heart failure 95/28/4132  . Hyperglycemia 05/24/2011  . Urinary retention 05/21/2011  . Steroid dependence 05/21/2011  . Pacemaker-St.Jude 05/21/2011  . Complete heart block 05/20/2011  . Tobacco abuse 05/20/2011   History   Social History  . Marital Status: Married    Spouse Name: N/A    Number of Children: N/A  . Years of Education: N/A   Occupational History  . retired    Social History Main Topics  . Smoking status: Current Every Day Smoker -- 2.00 packs/day    Types: Cigarettes  . Smokeless tobacco: Never Used  . Alcohol Use: No  . Drug Use: No  . Sexual Activity: Not Currently   Other Topics Concern  . Not on file   Social History Narrative  . No narrative on file   family history is not on file.     Objective:   Physical Exam well-developed elderly white male in no acute distress, pleasant somewhat hard of hearing blood pressure 112/64 pulse 66 height 5 foot 9 weight 182. HEENT; nontraumatic normocephalic EOMI PERRLA sclera anicteric, Supple; no JVD, Cardiovascular; regular rate and rhythm with S1-S2 no murmur or gallop does have the pacemaker left chest wall, Pulmonary; clear bilaterally, Abdomen; soft basically nontender there is no palpable mass or hepatosplenomegaly no guarding or rebound, Rectal ;exam not done, Extremities ;no clubbing cyanosis or edema skin warm and dry, Psych ;mood and affect appropriate        Assessment & Plan:  #82  80 year old male with history of multiple adenomatous polyps on colonoscopy August 2012 due for followup colonoscopy #2 change in bowel habits over the past 2 months with looser stools and then severe diarrheal illness x1 week improved after a course of Cipro. Cultures are negative areas suspect this was an infectious diarrhea. Will need to rule out underlying IBD or microscopic colitis #3 abnormal CT scan with mild diffuse  thickening of the colon #4 history of DVTs #5 chronic anticoagulation with Xarelto  #6 status post pacemaker placement for heart block #7 COPD  Plan ;Expectant management for now, as he has improved significantly after a short course of Cipro and suspect this was an acute infectious diarrhea. Patient  advised to call back should he develop any recurrence of diarrhea and at that point may try and appear course of metronidazole  #2 Will go ahead and schedule colonoscopy with Dr. Merita Norton discussed in detail with the patient and he is agreeable to proceed. We'll obtain consent from Dr. Reynaldo Minium for patient to hold his route so for 24 hours prior to his procedure #3 We'll give him an empiric course of Restora daily x3 weeks

## 2013-07-17 LAB — STOOL CULTURE

## 2013-07-17 NOTE — Progress Notes (Signed)
Reviewed and agree with management plan.  Madelina Sanda T. Aprel Egelhoff, MD FACG 

## 2013-07-23 ENCOUNTER — Telehealth: Payer: Self-pay | Admitting: *Deleted

## 2013-07-23 NOTE — Telephone Encounter (Signed)
Lm on voice mail for Stan Head who assists Dr. Burnard Bunting at [facility]. I Lynnell Grain the patient is having a procedure and we need Xarelto clearance.

## 2013-07-27 ENCOUNTER — Ambulatory Visit (AMBULATORY_SURGERY_CENTER): Payer: Medicare HMO | Admitting: Gastroenterology

## 2013-07-27 ENCOUNTER — Encounter: Payer: Self-pay | Admitting: Gastroenterology

## 2013-07-27 VITALS — BP 136/64 | HR 65 | Temp 97.6°F | Resp 37 | Ht 69.0 in | Wt 182.0 lb

## 2013-07-27 DIAGNOSIS — D126 Benign neoplasm of colon, unspecified: Secondary | ICD-10-CM

## 2013-07-27 DIAGNOSIS — R933 Abnormal findings on diagnostic imaging of other parts of digestive tract: Secondary | ICD-10-CM

## 2013-07-27 DIAGNOSIS — Z860100 Personal history of colon polyps, unspecified: Secondary | ICD-10-CM

## 2013-07-27 MED ORDER — SODIUM CHLORIDE 0.9 % IV SOLN: 500.0000 mL | INTRAVENOUS | Status: DC

## 2013-07-27 NOTE — Progress Notes (Signed)
Called to room to assist during endoscopic procedure.  Patient ID and intended procedure confirmed with present staff. Received instructions for my participation in the procedure from the performing physician.  

## 2013-07-27 NOTE — Patient Instructions (Signed)
Discharge instructions given with verbal understanding. Handouts on polyps,diverticulosis and hemorrhoids. Hold aspirin and aspirin products for 2 weeks. Resume xarelto in 2 days. Resume previous medications. YOU HAD AN ENDOSCOPIC PROCEDURE TODAY AT [facility]: Refer to the procedure report that was given to you for any specific questions about what was found during the examination.  If the procedure report does not answer your questions, please call your gastroenterologist to clarify.  If you requested that your care partner not be given the details of your procedure findings, then the procedure report has been included in a sealed envelope for you to review at your convenience later.  YOU SHOULD EXPECT: Some feelings of bloating in the abdomen. Passage of more gas than usual.  Walking can help get rid of the air that was put into your GI tract during the procedure and reduce the bloating. If you had a lower endoscopy (such as a colonoscopy or flexible sigmoidoscopy) you may notice spotting of blood in your stool or on the toilet paper. If you underwent a bowel prep for your procedure, then you may not have a normal bowel movement for a few days.  DIET: Your first meal following the procedure should be a light meal and then it is ok to progress to your normal diet.  A half-sandwich or bowl of soup is an example of a good first meal.  Heavy or fried foods are harder to digest and may make you feel nauseous or bloated.  Likewise meals heavy in dairy and vegetables can cause extra gas to form and this can also increase the bloating.  Drink plenty of fluids but you should avoid alcoholic beverages for 24 hours.  ACTIVITY: Your care partner should take you home directly after the procedure.  You should plan to take it easy, moving slowly for the rest of the day.  You can resume normal activity the day after the procedure however you should NOT DRIVE or use heavy machinery for 24 hours  (because of the sedation medicines used during the test).    SYMPTOMS TO REPORT IMMEDIATELY: A gastroenterologist can be reached at any hour.  During normal business hours, 8:30 AM to 5:00 PM Monday through Friday, call [phone].  After hours and on weekends, please call the GI answering service at [phone] who will take a message and have the physician on call contact you.   Following lower endoscopy (colonoscopy or flexible sigmoidoscopy):  Excessive amounts of blood in the stool  Significant tenderness or worsening of abdominal pains  Swelling of the abdomen that is new, acute  Fever of 100F or higher FOLLOW UP: If any biopsies were taken you will be contacted by phone or by letter within the next 1-3 weeks.  Call your gastroenterologist if you have not heard about the biopsies in 3 weeks.  Our staff will call the home number listed on your records the next business day following your procedure to check on you and address any questions or concerns that you may have at that time regarding the information given to you following your procedure. This is a courtesy call and so if there is no answer at the home number and we have not heard from you through the emergency physician on call, we will assume that you have returned to your regular daily activities without incident.  SIGNATURES/CONFIDENTIALITY: You and/or your care partner have signed paperwork which will be entered into your electronic medical record.  These signatures attest to  the fact that that the information above on your After Visit Summary has been reviewed and is understood.  Full responsibility of the confidentiality of this discharge information lies with you and/or your care-partner.

## 2013-07-27 NOTE — Op Note (Signed)
[place]  [address] [place], [id number]   COLONOSCOPY PROCEDURE REPORT PATIENT: Lance, Smith  MR#: [id number] BIRTHDATE: 1931-03-16 , 82  yrs. old GENDER: Male ENDOSCOPIST: Ladene Artist, MD, [facility] PROCEDURE DATE:  07/27/2013 PROCEDURE:   Colonoscopy with snare polypectomy First Screening Colonoscopy - Avg.  risk and is 50 yrs.  old or older - No.  Prior Negative Screening - Now for repeat screening. N/A  History of Adenoma - Now for follow-up colonoscopy & has been > or = to 3 yrs.  Yes hx of adenoma.  Has been 3 or more years since last colonoscopy.  Polyps Removed Today? Yes. ASA CLASS:   Class III INDICATIONS:Patient's personal history of adenomatous colon polyps.  MEDICATIONS: MAC sedation, administered by CRNA and propofol (Diprivan) 180mg  IV DESCRIPTION OF PROCEDURE:   After the risks benefits and alternatives of the procedure were thoroughly explained, informed consent was obtained.  A digital rectal exam revealed no abnormalities of the rectum.   The LB [id number]  endoscope was introduced through the anus and advanced to the cecum, which was identified by both the appendix and ileocecal valve. No adverse events experienced.   The quality of the prep was good, using MoviPrep  The instrument was then slowly withdrawn as the colon was fully examined.  COLON FINDINGS: Two sessile polyps measuring 5-7 mm were found in the transverse colon.  A polypectomy was performed with a cold snare.  The resection was complete and the polyp tissue was completely retrieved.  Tattoo in the transverse colon at the site of the smaller polyp.  Two sessile polyps measuring 4-7 mm were found in the descending colon.  A polypectomy was performed with a cold snare.  The resection was complete and the polyp tissue was completely retrieved.   Mild diverticulosis was noted in the sigmoid colon.   The colon was otherwise normal.  There was no diverticulosis,  inflammation, polyps or cancers unless previously stated.  Retroflexed views revealed moderate internal hemorrhoids. The time to cecum=2 minutes 56 seconds.  Withdrawal time=12 minutes 07 seconds.  The scope was withdrawn and the procedure completed. COMPLICATIONS: There were no complications. ENDOSCOPIC IMPRESSION: 1.   Two sessile polyps 5-7 mm in the transverse colon; polypectomy performed with a cold snare 2.   Two sessile polyps 4-7 mm in the descending colon; polypectomy performed with a cold snare 3.   Mild diverticulosis in the sigmoid colon 4.   Moderate internal hemorrhoids  RECOMMENDATIONS: 1.  Hold aspirin, aspirin products, and anti-inflammatory medication for 2 weeks. 2.  Await pathology results 3.  Resume Xarelto in 2 days 4.  Given your age and comorbidities, you will not need another colonoscopy for colon cancer screening or polyp surveillance. These types of tests usually stop around the age 26.  eSigned:  Ladene Artist, MD, [facility] 07/27/2013 11:10 AM   cc: Burnard Bunting, MD   PATIENT NAME:  Lance, Smith MR#: [id number]

## 2013-07-27 NOTE — Telephone Encounter (Signed)
Dr. Reynaldo Minium from [facility] got back to me and advised and documented on our [place] letter I faxed him that the patient can hold the Xarelto until his procedure on 7-10.He can resume it on 07-28-2013.  Dr. Reynaldo Minium called the patient and advised his wife.

## 2013-07-27 NOTE — Progress Notes (Signed)
A/ox3, pleased with MAC, report to RN 

## 2013-07-30 ENCOUNTER — Telehealth: Payer: Self-pay | Admitting: *Deleted

## 2013-07-30 NOTE — Telephone Encounter (Signed)
  Follow up Call-  Call back number 07/27/2013  Post procedure Call Back phone  # [phone]  Permission to leave phone message Yes     Patient questions:  Do you have a fever, pain , or abdominal swelling? No. Pain Score  0 *  Have you tolerated food without any problems? Yes.    Have you been able to return to your normal activities? Yes.    Do you have any questions about your discharge instructions: Diet   No. Medications  No. Follow up visit  No.  Do you have questions or concerns about your Care? No.  Actions: * If pain score is 4 or above: No action needed, pain <4.

## 2013-08-06 ENCOUNTER — Encounter: Payer: Self-pay | Admitting: Gastroenterology

## 2013-08-22 ENCOUNTER — Ambulatory Visit (INDEPENDENT_AMBULATORY_CARE_PROVIDER_SITE_OTHER): Payer: Commercial Managed Care - HMO | Admitting: Internal Medicine

## 2013-08-22 ENCOUNTER — Encounter: Payer: Self-pay | Admitting: Internal Medicine

## 2013-08-22 VITALS — BP 131/73 | HR 76 | Ht 69.0 in | Wt 178.0 lb

## 2013-08-22 DIAGNOSIS — I2510 Atherosclerotic heart disease of native coronary artery without angina pectoris: Secondary | ICD-10-CM | POA: Insufficient documentation

## 2013-08-22 DIAGNOSIS — I2119 ST elevation (STEMI) myocardial infarction involving other coronary artery of inferior wall: Secondary | ICD-10-CM | POA: Insufficient documentation

## 2013-08-22 DIAGNOSIS — Z950 Presence of cardiac pacemaker: Secondary | ICD-10-CM

## 2013-08-22 DIAGNOSIS — I442 Atrioventricular block, complete: Secondary | ICD-10-CM

## 2013-08-22 HISTORY — DX: ST elevation (STEMI) myocardial infarction involving other coronary artery of inferior wall: I21.19

## 2013-08-22 LAB — MDC_IDC_ENUM_SESS_TYPE_INCLINIC
Battery Voltage: 2.93 V
Brady Statistic RA Percent Paced: 23 %
Brady Statistic RV Percent Paced: 98 %
Implantable Pulse Generator Model: 2110
Lead Channel Impedance Value: 375 Ohm
Lead Channel Impedance Value: 462.5 Ohm
Lead Channel Pacing Threshold Amplitude: 0.625 V
Lead Channel Pacing Threshold Amplitude: 0.75 V
Lead Channel Pacing Threshold Pulse Width: 0.5 ms
Lead Channel Pacing Threshold Pulse Width: 0.5 ms
Lead Channel Sensing Intrinsic Amplitude: 4.6 mV
Lead Channel Setting Pacing Amplitude: 0.875
Lead Channel Setting Pacing Pulse Width: 0.5 ms
MDC IDC MSMT BATTERY REMAINING LONGEVITY: 104.4 mo
MDC IDC PG SERIAL: 7321242
MDC IDC SESS DTM: 20150805103850
MDC IDC SET LEADCHNL RA PACING AMPLITUDE: 2 V
MDC IDC SET LEADCHNL RV SENSING SENSITIVITY: 4 mV

## 2013-08-22 NOTE — Patient Instructions (Signed)

## 2013-08-22 NOTE — Progress Notes (Signed)
Patient Care Team: Geoffery Lyons, MD as PCP - General (Internal Medicine)   HPI  Lance Smith is a 80 y.o. male Seen following pacemaker implantation May 2013 for complete heart block. Echocardiogram at that time demonstrated an ejection fraction 55-60%  no syncope Still smoking  He has a chest discomfort. This typically lasts just minutes. It is not necessarily associated with exercise. There is no associated radiation. He has progressive, albeit slowly, dyspnea on exertion. We undertook a Myoview scan. This demonstrated interval myocardial infarction with a perfusion defect control lateral and anteroseptal regions. Overall ejection fraction was 51%  He has not had any exertional chest pain. He does complain of left arm numbness and tingling. It involves the 2 lateral digits   He also has history of r in ecurrent lower extremity DVT; he says this is #3; he is back on anticoagulation with Rivaroxaban  He has significant daytime somnolence and obstructive breathing patterns at night Past Medical History  Diagnosis Date  . Arthritis   . Hyperlipidemia   . DVT (deep venous thrombosis), unspecified laterality     a. previously on coumadin - d/c'd spring of 2013  . Colon polyp   . COPD (chronic obstructive pulmonary disease)   . Complete heart block     a. 05/2011 s/p SJM Model [id number] Dual Chamber PPM  . Hyperglycemia   . Diastolic CHF     a. 09/3568 Echo: EF 55-60%  . Steroid dependence   . Tobacco abuse     Past Surgical History  Procedure Laterality Date  . Bilateral inguinal hernia    . Pacemaker insertion  May 2013    Current Outpatient Prescriptions  Medication Sig Dispense Refill  . Acetaminophen 500 MG coapsule       . furosemide (LASIX) 40 MG tablet Take 40 mg by mouth daily.      . mometasone-formoterol (DULERA) 200-5 MCG/ACT AERO Inhale 2 puffs into the lungs 2 (two) times daily as needed for wheezing or shortness of breath.       . Multiple  Vitamins-Minerals (CENTRUM SILVER ADULT 50+ PO) Take 1 tablet by mouth daily.       . nitroGLYCERIN (NITROSTAT) 0.4 MG SL tablet Place 0.4 mg under the tongue every 5 (five) minutes as needed. For chest pain      . ondansetron (ZOFRAN) 4 MG tablet Take 4 mg by mouth every 6 (six) hours as needed for nausea or vomiting.      . predniSONE (DELTASONE) 10 MG tablet Take 10 mg by mouth every morning.       Marland Kitchen RAPAFLO 8 MG CAPS capsule Take 8 mg by mouth daily with breakfast.       . simvastatin (ZOCOR) 40 MG tablet Take 40 mg by mouth every morning.       Alveda Reasons 20 MG TABS tablet Take 20 mg by mouth daily.        No current facility-administered medications for this visit.    Allergies  Allergen Reactions  . Aspirin Nausea Only and Other (See Comments)    headache  . Tetanus Toxoids Other (See Comments)    Unspecified reaction  . Penicillins Swelling    Review of Systems negative except from HPI and PMH  Physical Exam BP 131/73  Pulse 76  Ht 5\' 9"  (1.753 m)  Wt 178 lb (80.74 kg)  BMI 26.27 kg/m2 Well developed and well nourished in no acute distress fallen asleep HENT normal E  scleral and icterus clear Neck Supple JVP flat; carotids brisk and full Clear but decreased Regular rate and rhythm, no murmurs gallops or rub Soft with active bowel sounds No clubbing cyanosis tr Edema Alert and oriented, grossly normal motor and sensory function voice raspy Skin Warm and Dry  ECG demonstrates P-synchronous/ AV  pacing  Assessment and  Plan  Complete heart block  CAD  Prior MI  Pacemaker-St. Jude  DVT-recurrent  Daytime somnolence/obstructive nocturnal breathing  Paroxysmal atrial arrhythmia Atrial rhythms are quiet. Device function is normal.  We reviewed his Myoview scan. There is no significant ischemia; it is considered low risk. He is on statin therapy. I will defer to Dr. Reynaldo Minium the management of his lipids although he should probably have an LDL target of 70.   In  addition, he continues to struggle with daytime somnolence. I think he needs a sleep study. I will defer this also to Dr. Reynaldo Minium.

## 2013-08-27 ENCOUNTER — Encounter: Payer: Self-pay | Admitting: Internal Medicine

## 2013-09-18 ENCOUNTER — Encounter: Payer: Self-pay | Admitting: Gastroenterology

## 2013-11-23 ENCOUNTER — Telehealth: Payer: Self-pay | Admitting: Cardiology

## 2013-11-23 ENCOUNTER — Ambulatory Visit (INDEPENDENT_AMBULATORY_CARE_PROVIDER_SITE_OTHER): Payer: Commercial Managed Care - HMO | Admitting: *Deleted

## 2013-11-23 DIAGNOSIS — I442 Atrioventricular block, complete: Secondary | ICD-10-CM

## 2013-11-23 NOTE — Telephone Encounter (Signed)
Confirmed remote transmission with pt wife.

## 2013-11-23 NOTE — Progress Notes (Signed)
Remote pacemaker transmission.   

## 2013-11-26 LAB — MDC_IDC_ENUM_SESS_TYPE_REMOTE
Battery Remaining Longevity: 88 mo
Battery Remaining Percentage: 67 %
Brady Statistic AP VS Percent: 1 %
Brady Statistic AS VS Percent: 1 %
Date Time Interrogation Session: 20151106174137
Implantable Pulse Generator Serial Number: 7321242
Lead Channel Impedance Value: 390 Ohm
Lead Channel Pacing Threshold Amplitude: 0.625 V
Lead Channel Pacing Threshold Pulse Width: 0.5 ms
Lead Channel Pacing Threshold Pulse Width: 0.5 ms
Lead Channel Sensing Intrinsic Amplitude: 4.5 mV
Lead Channel Setting Pacing Amplitude: 0.875
Lead Channel Setting Pacing Amplitude: 2 V
Lead Channel Setting Pacing Pulse Width: 0.5 ms
MDC IDC MSMT BATTERY VOLTAGE: 2.93 V
MDC IDC MSMT LEADCHNL RA PACING THRESHOLD AMPLITUDE: 0.75 V
MDC IDC MSMT LEADCHNL RV IMPEDANCE VALUE: 460 Ohm
MDC IDC MSMT LEADCHNL RV SENSING INTR AMPL: 12 mV
MDC IDC SET LEADCHNL RV SENSING SENSITIVITY: 4 mV
MDC IDC STAT BRADY AP VP PERCENT: 18 %
MDC IDC STAT BRADY AS VP PERCENT: 80 %
MDC IDC STAT BRADY RA PERCENT PACED: 17 %
MDC IDC STAT BRADY RV PERCENT PACED: 98 %

## 2013-12-19 ENCOUNTER — Encounter: Payer: Self-pay | Admitting: Cardiology

## 2013-12-24 ENCOUNTER — Encounter: Payer: Self-pay | Admitting: Internal Medicine

## 2013-12-27 ENCOUNTER — Encounter (HOSPITAL_COMMUNITY): Payer: Self-pay | Admitting: Internal Medicine

## 2014-02-25 ENCOUNTER — Ambulatory Visit (INDEPENDENT_AMBULATORY_CARE_PROVIDER_SITE_OTHER): Payer: Medicare Other | Admitting: *Deleted

## 2014-02-25 DIAGNOSIS — I442 Atrioventricular block, complete: Secondary | ICD-10-CM

## 2014-02-25 NOTE — Progress Notes (Signed)
Remote pacemaker transmission.   

## 2014-02-27 LAB — MDC_IDC_ENUM_SESS_TYPE_REMOTE
Battery Remaining Percentage: 72 %
Battery Voltage: 2.95 V
Brady Statistic AS VP Percent: 76 %
Brady Statistic AS VS Percent: 1 %
Date Time Interrogation Session: 20160208140436
Implantable Pulse Generator Serial Number: 7321242
Lead Channel Impedance Value: 460 Ohm
Lead Channel Pacing Threshold Amplitude: 0.625 V
Lead Channel Pacing Threshold Pulse Width: 0.5 ms
Lead Channel Sensing Intrinsic Amplitude: 12 mV
Lead Channel Setting Pacing Amplitude: 0.875
Lead Channel Setting Pacing Amplitude: 2 V
Lead Channel Setting Pacing Pulse Width: 0.5 ms
Lead Channel Setting Sensing Sensitivity: 4 mV
MDC IDC MSMT BATTERY REMAINING LONGEVITY: 95 mo
MDC IDC MSMT LEADCHNL RA IMPEDANCE VALUE: 390 Ohm
MDC IDC MSMT LEADCHNL RA PACING THRESHOLD AMPLITUDE: 0.75 V
MDC IDC MSMT LEADCHNL RA PACING THRESHOLD PULSEWIDTH: 0.5 ms
MDC IDC MSMT LEADCHNL RA SENSING INTR AMPL: 4 mV
MDC IDC STAT BRADY AP VP PERCENT: 22 %
MDC IDC STAT BRADY AP VS PERCENT: 1 %
MDC IDC STAT BRADY RA PERCENT PACED: 21 %
MDC IDC STAT BRADY RV PERCENT PACED: 99 %

## 2014-03-18 ENCOUNTER — Encounter: Payer: Self-pay | Admitting: Cardiology

## 2014-03-25 ENCOUNTER — Encounter: Payer: Self-pay | Admitting: Internal Medicine

## 2014-04-06 ENCOUNTER — Emergency Department (HOSPITAL_BASED_OUTPATIENT_CLINIC_OR_DEPARTMENT_OTHER)
Admission: EM | Admit: 2014-04-06 | Discharge: 2014-04-06 | Disposition: A | Discharge status: Home / Self Care | Payer: Medicare Other | Attending: Emergency Medicine | Admitting: Emergency Medicine

## 2014-04-06 ENCOUNTER — Encounter (HOSPITAL_BASED_OUTPATIENT_CLINIC_OR_DEPARTMENT_OTHER): Payer: Self-pay | Admitting: *Deleted

## 2014-04-06 ENCOUNTER — Emergency Department (HOSPITAL_BASED_OUTPATIENT_CLINIC_OR_DEPARTMENT_OTHER): Payer: Medicare Other

## 2014-04-06 DIAGNOSIS — Z7901 Long term (current) use of anticoagulants: Secondary | ICD-10-CM | POA: Diagnosis not present

## 2014-04-06 DIAGNOSIS — I252 Old myocardial infarction: Secondary | ICD-10-CM | POA: Insufficient documentation

## 2014-04-06 DIAGNOSIS — M1990 Unspecified osteoarthritis, unspecified site: Secondary | ICD-10-CM | POA: Diagnosis not present

## 2014-04-06 DIAGNOSIS — S6992XA Unspecified injury of left wrist, hand and finger(s), initial encounter: Secondary | ICD-10-CM | POA: Diagnosis present

## 2014-04-06 DIAGNOSIS — Z86718 Personal history of other venous thrombosis and embolism: Secondary | ICD-10-CM | POA: Insufficient documentation

## 2014-04-06 DIAGNOSIS — E785 Hyperlipidemia, unspecified: Secondary | ICD-10-CM | POA: Insufficient documentation

## 2014-04-06 DIAGNOSIS — Z720 Tobacco use: Secondary | ICD-10-CM | POA: Insufficient documentation

## 2014-04-06 DIAGNOSIS — Z950 Presence of cardiac pacemaker: Secondary | ICD-10-CM | POA: Diagnosis not present

## 2014-04-06 DIAGNOSIS — Z79899 Other long term (current) drug therapy: Secondary | ICD-10-CM | POA: Diagnosis not present

## 2014-04-06 DIAGNOSIS — J449 Chronic obstructive pulmonary disease, unspecified: Secondary | ICD-10-CM | POA: Insufficient documentation

## 2014-04-06 DIAGNOSIS — S62631B Displaced fracture of distal phalanx of left index finger, initial encounter for open fracture: Secondary | ICD-10-CM | POA: Diagnosis not present

## 2014-04-06 DIAGNOSIS — S61311A Laceration without foreign body of left index finger with damage to nail, initial encounter: Secondary | ICD-10-CM | POA: Insufficient documentation

## 2014-04-06 DIAGNOSIS — S61313A Laceration without foreign body of left middle finger with damage to nail, initial encounter: Secondary | ICD-10-CM | POA: Insufficient documentation

## 2014-04-06 DIAGNOSIS — W231XXA Caught, crushed, jammed, or pinched between stationary objects, initial encounter: Secondary | ICD-10-CM | POA: Diagnosis not present

## 2014-04-06 DIAGNOSIS — I5030 Unspecified diastolic (congestive) heart failure: Secondary | ICD-10-CM | POA: Diagnosis not present

## 2014-04-06 DIAGNOSIS — Z880 Allergy status to penicillin: Secondary | ICD-10-CM | POA: Insufficient documentation

## 2014-04-06 DIAGNOSIS — Y998 Other external cause status: Secondary | ICD-10-CM | POA: Insufficient documentation

## 2014-04-06 DIAGNOSIS — Z86010 Personal history of colonic polyps: Secondary | ICD-10-CM | POA: Diagnosis not present

## 2014-04-06 DIAGNOSIS — Y9289 Other specified places as the place of occurrence of the external cause: Secondary | ICD-10-CM | POA: Insufficient documentation

## 2014-04-06 DIAGNOSIS — S62609B Fracture of unspecified phalanx of unspecified finger, initial encounter for open fracture: Secondary | ICD-10-CM

## 2014-04-06 DIAGNOSIS — Y9389 Activity, other specified: Secondary | ICD-10-CM | POA: Diagnosis not present

## 2014-04-06 MED ORDER — LIDOCAINE HCL 2 % IJ SOLN
20.0000 mL | Freq: Once | INTRAMUSCULAR | Status: AC
  Administered 2014-04-06: 400 mg
  Filled 2014-04-06: qty 20

## 2014-04-06 MED ORDER — HYDROCODONE-ACETAMINOPHEN 5-325 MG PO TABS
ORAL_TABLET | ORAL | Status: AC
  Filled 2014-04-06: qty 1

## 2014-04-06 MED ORDER — HYDROCODONE-ACETAMINOPHEN 5-325 MG PO TABS
1.0000 | ORAL_TABLET | Freq: Once | ORAL | Status: AC
  Administered 2014-04-06: 1 via ORAL

## 2014-04-06 MED ORDER — CEFAZOLIN SODIUM 1 G IJ SOLR
INTRAMUSCULAR | Status: AC
  Administered 2014-04-06: 1000 mg
  Filled 2014-04-06: qty 10

## 2014-04-06 MED ORDER — HYDROCODONE-ACETAMINOPHEN 5-325 MG PO TABS: 1.0000 | ORAL_TABLET | Freq: Four times a day (QID) | ORAL | Status: DC | PRN

## 2014-04-06 MED ORDER — CEPHALEXIN 500 MG PO CAPS: 500.0000 mg | ORAL_CAPSULE | Freq: Four times a day (QID) | ORAL | Status: DC

## 2014-04-06 MED ORDER — CEFAZOLIN SODIUM 1-5 GM-% IV SOLN: 1.0000 g | Freq: Once | INTRAVENOUS | Status: AC

## 2014-04-06 NOTE — ED Notes (Signed)
Dressing applied by tech and instructions on care provided

## 2014-04-06 NOTE — ED Provider Notes (Signed)
LACERATION REPAIR Performed by: Harlow Mares Authorized by: Harlow Mares Consent: Verbal consent obtained. Risks and benefits: risks, benefits and alternatives were discussed Consent given by: patient Patient identity confirmed: provided demographic data Prepped and Draped in normal sterile fashion Wound explored  Laceration Location: left index finger  Laceration Length: 1.5 cm  No Foreign Bodies seen or palpated  Digital Block performed by Dr. Dina Rich (see separate note)  Irrigation method: syringe Amount of cleaning: standard  Skin closure: 4-0 Chromic  Number of sutures: 3  Technique: Simple Interrupted  Patient tolerance: Patient tolerated the procedure well with no immediate complications.   Baron Sane, PA-C 04/07/14 [place], MD 04/07/14 [phone]

## 2014-04-06 NOTE — Discharge Instructions (Signed)
Keep your finger covered and splinted into you follow-up with orthopedics.  Finger Fracture (Phalangeal) A broken bone of the finger (phalangealfracture) is a common injury for athletes. A single injury (trauma) is likely to fracture multiple bones on the same or different fingers. SYMPTOMS   Severe pain, at the time of injury.  Pain, tenderness, swelling, and later bruising of the finger and then the hand.  Visible deformity, if the fracture is complete and the bone fragments separate enough to distort the normal shape.  Numbness or coldness from swelling in the finger, causing pressure on blood vessels or nerves (uncommon). CAUSES  Direct or indirect injury (trauma) to the finger.  RISK INCREASES WITH:   Contact sports (football, rugby) or other sports where injury to the hand is likely (soccer, baseball, basketball).  Sports that require hitting (boxing, martial arts).  History of bone or joint disease, such as osteoporosis, or previous bone restraint.  Poor hand strength and flexibility. PREVENTION   For contact sports, wear appropriate and properly fitted protective equipment for the hand.  Learn and use proper technique when hitting, punching, or landing after a fall.  If you had a previous finger injury or hand restraint, use tape or padding to protect the finger when playing sports where finger injury is likely. PROGNOSIS  With proper treatment and normal alignment of the bones, healing can usually be expected in 4 to 6 weeks. Sometimes, surgery is needed.  RELATED COMPLICATIONS   Fracture does not heal (nonunion).  Bone heals in wrong position (malunion).  Chronic pain, stiffness, or swelling of the hand.  Excessive bleeding, causing pressure on nerves and blood vessels.  Unstable or arthritic joint, following repeated injury or delayed treatment.  Hindrance of normal growth in children.  Infection in skin broken over the fracture (open fracture) or at the  incision or pin sites from surgery.  Shortening of injured bones.  Bony bumps or loss of shape of the fingers.  Arthritic or stiff finger joint, if the fracture reaches the joint. TREATMENT  If the bones are properly aligned, treatment involves ice and medicine to reduce pain and inflammation. Then, the finger is restrained for 4 or more weeks, to allow for healing. If the fracture is out of alignment (displaced), involves more than one bone, or involves a joint, surgery is usually advised. Surgery often involves placing removable pins, screws, and sometimes plates, to hold the bones in proper alignment. After restraint (with or without surgery), stretching and strengthening exercises are needed. Exercises may be completed at home or with a therapist. For certain sports, wearing a splint or having the finger taped during future activity is advised.  MEDICATION   If pain medicine is needed, nonsteroidal anti-inflammatory medicines (aspirin and ibuprofen), or other minor pain relievers (acetaminophen), are often advised.  Do not take pain medicine for 7 days before surgery.  Prescription pain relievers are usually prescribed only after surgery. Use only as directed and only as much as you need. COLD THERAPY   Cold treatment (icing) relieves pain and reduces inflammation. Cold treatment should be applied for 10 to 15 minutes every 2 to 3 hours, and immediately after activity that aggravates your symptoms. Use ice packs or an ice massage. SEEK MEDICAL CARE IF:   Pain, tenderness, or swelling gets worse, despite treatment.  You experience pain, numbness, or coldness in the hand.  Blue, gray, or dark color appears in the fingernails.  Any of the following occur after surgery: fever, increased pain, swelling,  redness, drainage of fluids, or bleeding in the affected area.  New, unexplained symptoms develop. (Drugs used in treatment may produce side effects.) Document Released: 01/04/2005  Document Revised: 03/29/2011 Document Reviewed: 04/18/2008 [facility] Patient Information 2015 [place], [place]. This information is not intended to replace advice given to you by your health care provider. Make sure you discuss any questions you have with your health care provider.

## 2014-04-06 NOTE — ED Notes (Addendum)
Pt got left hand caught in a joiner- left index and middle fingers with jagged lacerations- pt on blood thinner- bandage applied in triage- bleeding controlled at this time

## 2014-04-06 NOTE — ED Notes (Signed)
Ancel 1 gram in 50cc of D5W infusing at 100cc/hr

## 2014-04-06 NOTE — ED Provider Notes (Addendum)
CSN: [id number]     Arrival date & time 04/06/14  1255 History   First MD Initiated Contact with Patient 04/06/14 1321     Chief Complaint  Patient presents with  . Hand Injury     (Consider location/radiation/quality/duration/timing/severity/associated sxs/prior Treatment) HPI   THis is an 81 yo male with history of DVT on Xarelto, COPD who presents with left hand injury.  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  Noted injury to the left 2nd and 3rd digits.  Denies any other injury.  Reports allergy of "swelling" to TdaP in the past.  Reports 10/10 pain at the site.  Denies any numbness.  Past Medical History  Diagnosis Date  . Arthritis   . Hyperlipidemia   . DVT (deep venous thrombosis), unspecified laterality     a. previously on coumadin - d/c'd spring of 2013  . Colon polyp   . COPD (chronic obstructive pulmonary disease)   . Complete heart block     a. 05/2011 s/p SJM Model [id number] Dual Chamber PPM  . Hyperglycemia   . Diastolic CHF     a. 06/7589 Echo: EF 55-60%  . Steroid dependence   . Tobacco abuse   . Inferior myocardial infarction 08/22/2013   Past Surgical History  Procedure Laterality Date  . Bilateral inguinal hernia    . Pacemaker insertion  May 2013  . Permanent pacemaker insertion Left 05/20/2011    Procedure: PERMANENT PACEMAKER INSERTION;  Surgeon: Deboraha Sprang, MD;  Location: [facility] CATH LAB;  Service: Cardiovascular;  Laterality: Left;   Family History  Problem Relation Age of Onset  . Colon cancer Neg Hx    History  Substance Use Topics  . Smoking status: Current Every Day Smoker -- 2.00 packs/day    Types: Cigarettes  . Smokeless tobacco: Never Used  . Alcohol Use: No    Review of Systems  Respiratory: Positive for wheezing.   Musculoskeletal:       Left 2nd and 3rd finger lacerations  Neurological: Negative for numbness.  All other systems reviewed and are negative.     Allergies   Aspirin; Tetanus toxoids; and Penicillins  Home Medications   Prior to Admission medications   Medication Sig Start Date End Date Taking? Authorizing Provider  Acetaminophen 500 MG coapsule  06/13/13   Historical Provider, MD  cephALEXin (KEFLEX) 500 MG capsule Take 1 capsule (500 mg total) by mouth 4 (four) times daily. 04/06/14   Merryl Hacker, MD  furosemide (LASIX) 40 MG tablet Take 40 mg by mouth daily.    Historical Provider, MD  HYDROcodone-acetaminophen (NORCO/VICODIN) 5-325 MG per tablet Take 1 tablet by mouth every 6 (six) hours as needed for moderate pain. 04/06/14   Merryl Hacker, MD  mometasone-formoterol (DULERA) 200-5 MCG/ACT AERO Inhale 2 puffs into the lungs 2 (two) times daily as needed for wheezing or shortness of breath.     Historical Provider, MD  Multiple Vitamins-Minerals (CENTRUM SILVER ADULT 50+ PO) Take 1 tablet by mouth daily.     Historical Provider, MD  nitroGLYCERIN (NITROSTAT) 0.4 MG SL tablet Place 0.4 mg under the tongue every 5 (five) minutes as needed. For chest pain    Historical Provider, MD  ondansetron (ZOFRAN) 4 MG tablet Take 4 mg by mouth every 6 (six) hours as needed for nausea or vomiting.    Historical Provider, MD  predniSONE (DELTASONE) 10 MG tablet Take 10 mg by mouth  every morning.  07/15/10   Historical Provider, MD  RAPAFLO 8 MG CAPS capsule Take 8 mg by mouth daily with breakfast.  06/22/11   Historical Provider, MD  simvastatin (ZOCOR) 40 MG tablet Take 40 mg by mouth every morning.  07/15/10   Historical Provider, MD  XARELTO 20 MG TABS tablet Take 20 mg by mouth daily.  05/10/13   Historical Provider, MD   BP 111/63 mmHg  Pulse 65  Temp(Src) 98.1 F (36.7 C) (Oral)  Resp 16  Ht 5\' 9"  (1.753 m)  Wt 185 lb (83.915 kg)  BMI 27.31 kg/m2  SpO2 95% Physical Exam  Constitutional: He is oriented to person, place, and time. No distress.  Elderly, NAD  HENT:  Head: Normocephalic and atraumatic.  Cardiovascular: Normal rate, regular  rhythm and normal heart sounds.   Pulmonary/Chest: Effort normal. No respiratory distress. He has wheezes.  Musculoskeletal:  Dime sized (0.5 cm) defect noted to the 2nd left digit involving the nail bed - 1/2 of the nail is missing as well as a portion of the nailbed tissue, oozing noted.  3rd digit with 1.5 cm laceration just adjacent to the nail bed, ROM of the PIP and DIP joints intact  Lymphadenopathy:    He has no cervical adenopathy.  Neurological: He is alert and oriented to person, place, and time.  Skin: Skin is warm and dry.  Psychiatric: He has a normal mood and affect.  Nursing note and vitals reviewed.   ED Course  LACERATION REPAIR Date/Time: 04/07/2014 12:49 PM Performed by: Merryl Hacker Authorized by: Merryl Hacker Consent: Verbal consent obtained. Consent given by: patient Patient identity confirmed: verbally with patient Body area: upper extremity Location details: left index finger Foreign bodies: no foreign bodies Tendon involvement: none Nerve involvement: none Anesthesia: digital block Local anesthetic: lidocaine 2% without epinephrine Anesthetic total: 3 ml Patient sedated: no Preparation: Patient was prepped and draped in the usual sterile fashion. Irrigation solution: saline Irrigation method: syringe Amount of cleaning: extensive Debridement: minimal Wound skin closure material used: 4-0 Vicryl. Number of sutures: 5 Technique: simple Approximation: loose Approximation difficulty: complex Dressing: 4x4 sterile gauze and splint Patient tolerance: Patient tolerated the procedure well with no immediate complications Comments: Laceration adjacent to the nail bed repaired with 4-0 Chromic as requested by Dr. Grandville Silos.  Missing tissue over the nailbed left exposed and residual nail left in place.   (including critical care time) Labs Review Labs Reviewed - No data to display  Imaging Review Dg Hand Complete Left  04/06/2014   CLINICAL  DATA:  Pt got hand caught in a joiner and has jagged lacerations on the tips of his left index and middle finger. Pt is on blood thinners. Hx smashed his index finger 60 years ago and says he lost part of the bone in his joint  EXAM: LEFT HAND - COMPLETE 3+ VIEW  COMPARISON:  None.  FINDINGS: There is loss of a portion of the distal tuft of the distal phalanx of the index finger, probably from the remote injury described by the patient. There is an overlying soft tissue abnormality that may also be chronic, but warrants clinical correlation. On the lateral view, there is evidence suggesting a fracture of the remaining portion of the distal tuft of the distal phalanx of the index finger.  No other evidence of a fracture. There are osteoarthritic changes involving interphalangeal joints most prominently the middle finger DIP joint. There are osteoarthritic changes involving the scaphoid trapezium trapezoid  articulation and the trapezium first metacarpal articulation.  Bones are diffusely demineralized. Small metallic density projects over the base of the distal phalanx of the thumb. Another small metallic foreign body projects along the radial margin of the mid aspect of the left fifth finger. No other radiopaque foreign bodies.  IMPRESSION: 1. Old avulsion injury to the distal tuft of the right index finger. 2. Questionable acute fracture of the remaining portion of the distal tuft of the left index finger, nondisplaced. 3. No other evidence of a fracture. 4. Two small metallic foreign bodies 1 projecting over the thumb and the other the fifth finger, which could be acute related to the current injury or chronic.   Electronically Signed   By: Lajean Manes M.D.   On: 04/06/2014 13:51     EKG Interpretation None      MDM   Final diagnoses:  Open finger fracture, initial encounter    [place] presents with left finger injury.  Missing tissue noted.  Xray with questionable acute fracture.  Patient allergic  to TdaP.  Given 1 g Ancef.  Discussed with Dr. Grandville Silos (ortho hand).  WIll clean and close as best as possible with absorbable VIcryl suture, per Dr. Biagio Borg request. Requested nurse splint after repair to protect.  Patient d/c'd home with pain medication and Keflex.  Laceration on 3rd digit sutured by PA.  After history, exam, and medical workup I feel the patient has been appropriately medically screened and is safe for discharge home. Pertinent diagnoses were discussed with the patient. Patient was given return precautions.     Merryl Hacker, MD 04/07/14 1257  Merryl Hacker, MD 04/07/14 1306  On recheck, Dr. Grandville Silos requested chromic suture.  Wound was stitched with Vicryl.  Informed Dr. Grandville Silos.  Merryl Hacker, MD 04/07/14 [place], MD 04/18/14 [phone]

## 2014-05-27 ENCOUNTER — Encounter: Payer: Self-pay | Admitting: Internal Medicine

## 2014-05-27 ENCOUNTER — Ambulatory Visit (INDEPENDENT_AMBULATORY_CARE_PROVIDER_SITE_OTHER): Payer: Medicare Other | Admitting: *Deleted

## 2014-05-27 DIAGNOSIS — I442 Atrioventricular block, complete: Secondary | ICD-10-CM

## 2014-05-27 NOTE — Progress Notes (Signed)
Remote pacemaker transmission.   

## 2014-05-31 LAB — CUP PACEART REMOTE DEVICE CHECK
Battery Remaining Longevity: 89 mo
Battery Voltage: 2.93 V
Brady Statistic AP VP Percent: 21 %
Brady Statistic AS VS Percent: 1 %
Brady Statistic RV Percent Paced: 99 %
Date Time Interrogation Session: 20160509142604
Lead Channel Impedance Value: 450 Ohm
Lead Channel Pacing Threshold Amplitude: 0.5 V
Lead Channel Pacing Threshold Amplitude: 0.75 V
Lead Channel Pacing Threshold Pulse Width: 0.5 ms
Lead Channel Sensing Intrinsic Amplitude: 12 mV
Lead Channel Sensing Intrinsic Amplitude: 4.1 mV
Lead Channel Setting Pacing Amplitude: 2 V
Lead Channel Setting Pacing Pulse Width: 0.5 ms
MDC IDC MSMT BATTERY REMAINING PERCENTAGE: 68 %
MDC IDC MSMT LEADCHNL RA IMPEDANCE VALUE: 390 Ohm
MDC IDC MSMT LEADCHNL RV PACING THRESHOLD PULSEWIDTH: 0.5 ms
MDC IDC PG SERIAL: 7321242
MDC IDC SET LEADCHNL RV PACING AMPLITUDE: 0.75 V
MDC IDC SET LEADCHNL RV SENSING SENSITIVITY: 4 mV
MDC IDC STAT BRADY AP VS PERCENT: 1 %
MDC IDC STAT BRADY AS VP PERCENT: 78 %
MDC IDC STAT BRADY RA PERCENT PACED: 19 %
Pulse Gen Model: 2110

## 2014-06-10 ENCOUNTER — Encounter: Payer: Self-pay | Admitting: Cardiology

## 2014-07-15 LAB — IFOBT (OCCULT BLOOD): IFOBT: POSITIVE

## 2014-07-31 ENCOUNTER — Encounter: Payer: Self-pay | Admitting: Gastroenterology

## 2014-07-31 ENCOUNTER — Ambulatory Visit (INDEPENDENT_AMBULATORY_CARE_PROVIDER_SITE_OTHER): Payer: Medicare Other | Admitting: Gastroenterology

## 2014-07-31 VITALS — BP 116/54 | HR 70 | Ht 69.0 in | Wt 181.0 lb

## 2014-07-31 DIAGNOSIS — Z7901 Long term (current) use of anticoagulants: Secondary | ICD-10-CM

## 2014-07-31 DIAGNOSIS — Z86010 Personal history of colonic polyps: Secondary | ICD-10-CM

## 2014-07-31 DIAGNOSIS — R1032 Left lower quadrant pain: Secondary | ICD-10-CM

## 2014-07-31 DIAGNOSIS — R195 Other fecal abnormalities: Secondary | ICD-10-CM | POA: Diagnosis not present

## 2014-07-31 NOTE — Progress Notes (Signed)
History of Present Illness: This is an 81 year old male for by Dr. Reynaldo Minium with a positive IFOB.  He underwent colonoscopy in July 2015 showing for small adenomatous colon polyps, diverticulosis and moderate-sized internal hemorrhoids. He notes a mild discomfort in his left lower quadrant prior to a bowel movement that is relieved with defecation. The symptom has been present for over a year and has not changed. He has no other GI complaints.  Allergies  Allergen Reactions  . Aspirin Nausea Only and Other (See Comments)    headache  . Tetanus Toxoids Other (See Comments)    "i get short of breath and swell"  . Penicillins Swelling   Outpatient Prescriptions Prior to Visit  Medication Sig Dispense Refill  . Acetaminophen 500 MG coapsule     . furosemide (LASIX) 40 MG tablet Take 40 mg by mouth daily.    Marland Kitchen HYDROcodone-acetaminophen (NORCO/VICODIN) 5-325 MG per tablet Take 1 tablet by mouth every 6 (six) hours as needed for moderate pain. 10 tablet 0  . mometasone-formoterol (DULERA) 200-5 MCG/ACT AERO Inhale 2 puffs into the lungs 2 (two) times daily as needed for wheezing or shortness of breath.     . Multiple Vitamins-Minerals (CENTRUM SILVER ADULT 50+ PO) Take 1 tablet by mouth daily.     . nitroGLYCERIN (NITROSTAT) 0.4 MG SL tablet Place 0.4 mg under the tongue every 5 (five) minutes as needed. For chest pain    . predniSONE (DELTASONE) 10 MG tablet Take 10 mg by mouth every morning.     Marland Kitchen RAPAFLO 8 MG CAPS capsule Take 8 mg by mouth daily with breakfast.     . simvastatin (ZOCOR) 40 MG tablet Take 40 mg by mouth every morning.     Alveda Reasons 20 MG TABS tablet Take 20 mg by mouth daily.     . cephALEXin (KEFLEX) 500 MG capsule Take 1 capsule (500 mg total) by mouth 4 (four) times daily. 20 capsule 0  . ondansetron (ZOFRAN) 4 MG tablet Take 4 mg by mouth every 6 (six) hours as needed for nausea or vomiting.     No facility-administered medications prior to visit.   Past Medical  History  Diagnosis Date  . Arthritis   . Hyperlipidemia   . DVT (deep venous thrombosis), unspecified laterality     a. previously on coumadin - d/c'd spring of 2013  . COPD (chronic obstructive pulmonary disease)   . Complete heart block     a. 05/2011 s/p SJM Model [id number] Dual Chamber PPM  . Hyperglycemia   . Diastolic CHF     a. 02/9796 Echo: EF 55-60%  . Steroid dependence   . Tobacco abuse   . Inferior myocardial infarction 08/22/2013  . Adenomatous colon polyp 01/2002  . Hypertension   . Diabetes mellitus   . CKD (chronic kidney disease) stage 3, GFR 30-59 ml/min    Past Surgical History  Procedure Laterality Date  . Bilateral inguinal hernia    . Pacemaker insertion  May 2013  . Permanent pacemaker insertion Left 05/20/2011    Procedure: PERMANENT PACEMAKER INSERTION;  Surgeon: Deboraha Sprang, MD;  Location: [facility] CATH LAB;  Service: Cardiovascular;  Laterality: Left;  Marland Kitchen Melanoma excision     History   Social History  . Marital Status: Married    Spouse Name: N/A  . Number of Children: N/A  . Years of Education: N/A   Occupational History  . retired    Social History Main Topics  . Smoking  status: Current Every Day Smoker -- 2.00 packs/day    Types: Cigarettes  . Smokeless tobacco: Never Used  . Alcohol Use: No  . Drug Use: No  . Sexual Activity: Not Currently   Other Topics Concern  . None   Social History Narrative   Family History  Problem Relation Age of Onset  . Colon cancer Neg Hx   . Leukemia Mother       Physical Exam: General: Well developed , well nourished, elderly, no acute distress Head: Normocephalic and atraumatic Eyes:  sclerae anicteric, EOMI Ears: decreased auditory acuity Mouth: No deformity or lesions Lungs: Clear throughout to auscultation Heart: Regular rate and rhythm; no murmurs, rubs or bruits Abdomen: Soft, non tender and non distended. No masses, hepatosplenomegaly or hernias noted. Normal Bowel sounds Musculoskeletal:  Symmetrical with no gross deformities  Pulses:  Normal pulses noted Extremities: No clubbing, cyanosis, edema or deformities noted Neurological: Alert oriented x 4, grossly nonfocal Psychological:  Alert and cooperative. Normal mood and affect  Assessment and Recommendations:  1. Occult blood in stool. Chronic mild left lower quadrant discomfort relieved by bowel movements. High fiber diet with adequate daily water intake. Colonoscopy performed one year ago. I strongly suspect hemorrhoids are the source of fecal occult blood. Given his age, recent colonoscopy and comorbidities no future surveillance colonoscopies are planned and I recommend no future screening testing for fecal occult blood.

## 2014-07-31 NOTE — Patient Instructions (Addendum)
Follow up with Dr. Fuller Plan as needed.  Thank you for choosing me and [facility].  Pricilla Riffle. Dagoberto Ligas., MD., Marval Regal  cc: Burnard Bunting, MD

## 2014-09-19 ENCOUNTER — Ambulatory Visit (INDEPENDENT_AMBULATORY_CARE_PROVIDER_SITE_OTHER): Payer: Medicare Other | Admitting: Internal Medicine

## 2014-09-19 ENCOUNTER — Encounter: Payer: Self-pay | Admitting: Internal Medicine

## 2014-09-19 VITALS — BP 130/80 | HR 81 | Ht 69.0 in | Wt 177.4 lb

## 2014-09-19 DIAGNOSIS — Z45018 Encounter for adjustment and management of other part of cardiac pacemaker: Secondary | ICD-10-CM

## 2014-09-19 DIAGNOSIS — I4891 Unspecified atrial fibrillation: Secondary | ICD-10-CM

## 2014-09-19 DIAGNOSIS — Z950 Presence of cardiac pacemaker: Secondary | ICD-10-CM | POA: Diagnosis not present

## 2014-09-19 DIAGNOSIS — I442 Atrioventricular block, complete: Secondary | ICD-10-CM

## 2014-09-19 LAB — CUP PACEART INCLINIC DEVICE CHECK
Battery Remaining Longevity: 122.4 mo
Battery Voltage: 2.95 V
Brady Statistic RV Percent Paced: 99 %
Lead Channel Impedance Value: 400 Ohm
Lead Channel Pacing Threshold Amplitude: 0.75 V
Lead Channel Pacing Threshold Pulse Width: 0.5 ms
Lead Channel Pacing Threshold Pulse Width: 0.5 ms
Lead Channel Setting Pacing Amplitude: 0.875
Lead Channel Setting Pacing Pulse Width: 0.5 ms
Lead Channel Setting Sensing Sensitivity: 4 mV
MDC IDC MSMT LEADCHNL RA SENSING INTR AMPL: 5 mV
MDC IDC MSMT LEADCHNL RV IMPEDANCE VALUE: 462.5 Ohm
MDC IDC MSMT LEADCHNL RV PACING THRESHOLD AMPLITUDE: 0.625 V
MDC IDC MSMT LEADCHNL RV SENSING INTR AMPL: 12 mV
MDC IDC PG SERIAL: 7321242
MDC IDC SESS DTM: 20160901171417
MDC IDC SET LEADCHNL RA PACING AMPLITUDE: 2 V
MDC IDC STAT BRADY RA PERCENT PACED: 20 %

## 2014-09-19 NOTE — Progress Notes (Signed)
Patient Care Team: Burnard Bunting, MD as PCP - General (Internal Medicine) Ladene Artist, MD as Consulting Physician (Gastroenterology) Deboraha Sprang, MD as Consulting Physician (Cardiology)   HPI  Lance Smith is a 81 y.o. male Seen following pacemaker implantation May 2013 for complete heart block. Echocardiogram at that time demonstrated an ejection fraction 55-60%  no syncope Still smoking  He has a chest discomfort. This typically lasts just minutes. It is not necessarily associated with exercise. There is no associated radiation. He has progressive, albeit slowly, dyspnea on exertion. We undertook a Myoview scan. This demonstrated interval myocardial infarction with a perfusion defect control lateral and anteroseptal regions. Overall ejection fraction was 51%  He has not had any exertional chest pain.  No sob chronic LE swelling  He also has history of recurrent lower extremity DVT; he says this is #3; he is   on chronic anticoagulation with Rivaroxaban    Past Medical History  Diagnosis Date  . Arthritis   . Hyperlipidemia   . DVT (deep venous thrombosis), unspecified laterality     a. previously on coumadin - d/c'd spring of 2013  . COPD (chronic obstructive pulmonary disease)   . Complete heart block     a. 05/2011 s/p SJM Model [id number] Dual Chamber PPM  . Hyperglycemia   . Diastolic CHF     a. 03/8180 Echo: EF 55-60%  . Steroid dependence   . Tobacco abuse   . Inferior myocardial infarction 08/22/2013  . Adenomatous colon polyp 01/2002  . Hypertension   . Diabetes mellitus   . CKD (chronic kidney disease) stage 3, GFR 30-59 ml/min     Past Surgical History  Procedure Laterality Date  . Bilateral inguinal hernia    . Pacemaker insertion  May 2013  . Permanent pacemaker insertion Left 05/20/2011    Procedure: PERMANENT PACEMAKER INSERTION;  Surgeon: Deboraha Sprang, MD;  [facility];  Service: Cardiovascular;  Laterality: Left;  Marland Kitchen Melanoma excision       Current Outpatient Prescriptions  Medication Sig Dispense Refill  . Acetaminophen 500 MG coapsule Take 2 capsules by mouth every 6 (six) hours as needed (pain).     . furosemide (LASIX) 40 MG tablet Take 40 mg by mouth daily.    . mometasone-formoterol (DULERA) 200-5 MCG/ACT AERO Inhale 2 puffs into the lungs 2 (two) times daily as needed for wheezing or shortness of breath.     . Multiple Vitamins-Minerals (CENTRUM SILVER ADULT 50+ PO) Take 1 tablet by mouth daily.     . nitroGLYCERIN (NITROSTAT) 0.4 MG SL tablet Place 0.4 mg under the tongue every 5 (five) minutes as needed. For chest pain    . predniSONE (DELTASONE) 10 MG tablet Take 10 mg by mouth every morning.     Marland Kitchen RAPAFLO 8 MG CAPS capsule Take 8 mg by mouth daily with breakfast.     . simvastatin (ZOCOR) 40 MG tablet Take 40 mg by mouth every morning.     Alveda Reasons 20 MG TABS tablet Take 20 mg by mouth daily.      No current facility-administered medications for this visit.    Allergies  Allergen Reactions  . Tetanus Toxoids Shortness Of Breath and Swelling  . Aspirin Nausea Only and Other (See Comments)    headache  . Penicillins Swelling    Review of Systems negative except from HPI and PMH  Physical Exam BP 130/80 mmHg  Pulse 81  Ht 5'  9" (1.753 m)  Wt 177 lb 6.4 oz (80.468 kg)  BMI 26.19 kg/m2 Well developed and well nourished in no acute distress fallen asleep HENT normal E scleral and icterus clear Neck Supple JVP flat; carotids brisk and full Clear but decreased Regular rate and rhythm, no murmurs gallops or rub Soft with active bowel sounds No clubbing cyanosis tr Edema Alert and oriented, grossly normal motor and sensory function voice raspy Skin Warm and Dry  ECG demonstrates P-synchronous/ AV  pacing  Assessment and  Plan  Complete heart block  CAD  Prior MI  Pacemaker-St. Jude  DVT-recurrent  Daytime somnolence/obstructive nocturnal breathing  Paroxysmal atrial arrhythmia Atrial  rhythms are quiet. Device function is normal.   Stable will see in 12 months with AS

## 2014-09-19 NOTE — Patient Instructions (Signed)
Medication Instructions:  Your physician recommends that you continue on your current medications as directed. Please refer to the Current Medication list given to you today.   Labwork: N/A  Testing/Procedures: N/A  Follow-Up:  Your physician wants you to follow-up in: 12 months with [facility]. You will receive a reminder letter in the mail two months in advance. If you don't receive a letter, please call our office to schedule the follow-up appointment.    Any Other Special Instructions Will Be Listed Below (If Applicable). Remote monitoring is used to monitor your Pacemaker or ICD from home. This monitoring reduces the number of office visits required to check your device to one time per year. It allows [place] to keep an eye on the functioning of your device to ensure it is working properly. You are scheduled for a device check from home on 12/19/14. You may send your transmission at any time that day. If you have a wireless device, the transmission will be sent automatically. After your physician reviews your transmission, you will receive a postcard with your next transmission date.

## 2014-12-19 ENCOUNTER — Ambulatory Visit (INDEPENDENT_AMBULATORY_CARE_PROVIDER_SITE_OTHER): Payer: Medicare Other | Admitting: *Deleted

## 2014-12-19 ENCOUNTER — Telehealth: Payer: Self-pay | Admitting: Cardiology

## 2014-12-19 DIAGNOSIS — I442 Atrioventricular block, complete: Secondary | ICD-10-CM | POA: Diagnosis not present

## 2014-12-19 NOTE — Telephone Encounter (Signed)
Spoke with pt and reminded pt of remote transmission that is due today. Pt verbalized understanding.   

## 2014-12-20 NOTE — Progress Notes (Signed)
Remote pacemaker transmission.   

## 2014-12-30 LAB — CUP PACEART REMOTE DEVICE CHECK
Brady Statistic AP VP Percent: 20 %
Brady Statistic AP VS Percent: 1 %
Brady Statistic AS VP Percent: 79 %
Brady Statistic AS VS Percent: 1 %
Brady Statistic RV Percent Paced: 99 %
Date Time Interrogation Session: 20161202132056
Implantable Lead Implant Date: 20130502
Implantable Lead Location: 753860
Lead Channel Impedance Value: 460 Ohm
Lead Channel Pacing Threshold Amplitude: 0.625 V
Lead Channel Sensing Intrinsic Amplitude: 12 mV
Lead Channel Setting Pacing Pulse Width: 0.5 ms
Lead Channel Setting Sensing Sensitivity: 4 mV
MDC IDC LEAD IMPLANT DT: 20130502
MDC IDC LEAD LOCATION: 753859
MDC IDC MSMT BATTERY REMAINING LONGEVITY: 106 mo
MDC IDC MSMT BATTERY REMAINING PERCENTAGE: 81 %
MDC IDC MSMT BATTERY VOLTAGE: 2.93 V
MDC IDC MSMT LEADCHNL RA IMPEDANCE VALUE: 400 Ohm
MDC IDC MSMT LEADCHNL RA PACING THRESHOLD AMPLITUDE: 0.75 V
MDC IDC MSMT LEADCHNL RA PACING THRESHOLD PULSEWIDTH: 0.5 ms
MDC IDC MSMT LEADCHNL RA SENSING INTR AMPL: 5 mV
MDC IDC MSMT LEADCHNL RV PACING THRESHOLD PULSEWIDTH: 0.5 ms
MDC IDC SET LEADCHNL RA PACING AMPLITUDE: 2 V
MDC IDC SET LEADCHNL RV PACING AMPLITUDE: 0.875
MDC IDC STAT BRADY RA PERCENT PACED: 18 %
Pulse Gen Model: 2110
Pulse Gen Serial Number: 7321242

## 2014-12-31 ENCOUNTER — Encounter: Payer: Self-pay | Admitting: Cardiology

## 2015-03-20 ENCOUNTER — Telehealth: Payer: Self-pay | Admitting: Cardiology

## 2015-03-20 ENCOUNTER — Ambulatory Visit (INDEPENDENT_AMBULATORY_CARE_PROVIDER_SITE_OTHER): Payer: Medicare Other | Admitting: *Deleted

## 2015-03-20 DIAGNOSIS — I442 Atrioventricular block, complete: Secondary | ICD-10-CM | POA: Diagnosis not present

## 2015-03-20 NOTE — Telephone Encounter (Signed)
LMOVM reminding pt to send remote transmission.   

## 2015-03-21 NOTE — Progress Notes (Signed)
Remote pacemaker transmission.   

## 2015-03-29 LAB — CUP PACEART REMOTE DEVICE CHECK
Battery Remaining Longevity: 104 mo
Battery Remaining Percentage: 81 %
Battery Voltage: 2.93 V
Brady Statistic AP VP Percent: 19 %
Brady Statistic RA Percent Paced: 17 %
Brady Statistic RV Percent Paced: 99 %
Date Time Interrogation Session: 20170302200055
Implantable Lead Location: 753859
Implantable Lead Location: 753860
Lead Channel Impedance Value: 450 Ohm
Lead Channel Pacing Threshold Amplitude: 0.625 V
Lead Channel Pacing Threshold Amplitude: 0.75 V
Lead Channel Pacing Threshold Pulse Width: 0.5 ms
Lead Channel Sensing Intrinsic Amplitude: 12 mV
Lead Channel Sensing Intrinsic Amplitude: 4.1 mV
Lead Channel Setting Pacing Amplitude: 2 V
Lead Channel Setting Pacing Pulse Width: 0.5 ms
Lead Channel Setting Sensing Sensitivity: 4 mV
MDC IDC LEAD IMPLANT DT: 20130502
MDC IDC LEAD IMPLANT DT: 20130502
MDC IDC MSMT LEADCHNL RA IMPEDANCE VALUE: 380 Ohm
MDC IDC MSMT LEADCHNL RV PACING THRESHOLD PULSEWIDTH: 0.5 ms
MDC IDC PG SERIAL: 7321242
MDC IDC SET LEADCHNL RV PACING AMPLITUDE: 0.875
MDC IDC STAT BRADY AP VS PERCENT: 1 %
MDC IDC STAT BRADY AS VP PERCENT: 80 %
MDC IDC STAT BRADY AS VS PERCENT: 1 %

## 2015-03-29 NOTE — Progress Notes (Signed)
Normal remote reviewed.  Next Merlin 06/19/15 

## 2015-04-02 ENCOUNTER — Encounter: Payer: Self-pay | Admitting: Cardiology

## 2015-06-19 ENCOUNTER — Telehealth: Payer: Self-pay | Admitting: Internal Medicine

## 2015-06-19 ENCOUNTER — Ambulatory Visit (INDEPENDENT_AMBULATORY_CARE_PROVIDER_SITE_OTHER): Payer: Medicare Other | Admitting: *Deleted

## 2015-06-19 ENCOUNTER — Telehealth: Payer: Self-pay | Admitting: Cardiology

## 2015-06-19 NOTE — Telephone Encounter (Signed)
Spoke with pt and reminded pt of remote transmission that is due today. Pt verbalized understanding.   

## 2015-06-19 NOTE — Telephone Encounter (Signed)
New Message  Pt called states that she tried to send a signal and it didn't go through. Please call back to discuss

## 2015-06-19 NOTE — Telephone Encounter (Signed)
LMOVM for pt to return call 

## 2015-06-20 ENCOUNTER — Encounter: Payer: Self-pay | Admitting: Cardiology

## 2015-06-20 NOTE — Telephone Encounter (Signed)
LMOVM for pt informing him that we have not received his remote transmission. Any questions he can call back. Left my direct number.

## 2015-06-20 NOTE — Telephone Encounter (Signed)
New message    4. Are you calling to see if we received your device transmission? Yes

## 2015-06-20 NOTE — Telephone Encounter (Signed)
LMOVM for pt to return call 

## 2015-06-21 DIAGNOSIS — I442 Atrioventricular block, complete: Secondary | ICD-10-CM

## 2015-06-23 NOTE — Progress Notes (Signed)
Remote pacemaker transmission.   

## 2015-06-24 ENCOUNTER — Telehealth: Payer: Self-pay | Admitting: Internal Medicine

## 2015-06-24 NOTE — Telephone Encounter (Signed)
Notified Mr. Sawdy of received transmission.

## 2015-06-24 NOTE — Telephone Encounter (Signed)
New Message  Pt calling to verify remote signal was sent successfully. Can leave detailed message on VM. Please call back and discuss.

## 2015-07-31 ENCOUNTER — Other Ambulatory Visit: Payer: Self-pay | Admitting: Internal Medicine

## 2015-07-31 DIAGNOSIS — G729 Myopathy, unspecified: Secondary | ICD-10-CM

## 2015-08-04 ENCOUNTER — Other Ambulatory Visit: Payer: Self-pay | Admitting: Internal Medicine

## 2015-08-04 DIAGNOSIS — G729 Myopathy, unspecified: Secondary | ICD-10-CM

## 2015-08-05 ENCOUNTER — Ambulatory Visit
Admission: RE | Admit: 2015-08-05 | Discharge: 2015-08-05 | Disposition: A | Discharge status: Home / Self Care | Payer: Medicare Other | Source: Ambulatory Visit | Attending: Internal Medicine | Admitting: Internal Medicine

## 2015-08-05 DIAGNOSIS — G729 Myopathy, unspecified: Secondary | ICD-10-CM

## 2015-09-19 ENCOUNTER — Encounter: Payer: Self-pay | Admitting: Nurse Practitioner

## 2015-09-19 ENCOUNTER — Ambulatory Visit (INDEPENDENT_AMBULATORY_CARE_PROVIDER_SITE_OTHER): Payer: Medicare Other | Admitting: Nurse Practitioner

## 2015-09-19 VITALS — BP 114/68 | HR 69 | Ht 69.0 in | Wt 168.6 lb

## 2015-09-19 DIAGNOSIS — I480 Paroxysmal atrial fibrillation: Secondary | ICD-10-CM | POA: Diagnosis not present

## 2015-09-19 DIAGNOSIS — I442 Atrioventricular block, complete: Secondary | ICD-10-CM

## 2015-09-19 DIAGNOSIS — I82403 Acute embolism and thrombosis of unspecified deep veins of lower extremity, bilateral: Secondary | ICD-10-CM | POA: Diagnosis not present

## 2015-09-19 LAB — CUP PACEART INCLINIC DEVICE CHECK
Date Time Interrogation Session: 20170901111814
Implantable Lead Implant Date: 20130502
Implantable Lead Location: 753860
MDC IDC LEAD IMPLANT DT: 20130502
MDC IDC LEAD LOCATION: 753859
MDC IDC PG SERIAL: 7321242
Pulse Gen Model: 2110

## 2015-09-19 NOTE — Patient Instructions (Signed)
Medication Instructions:  Your physician recommends that you continue on your current medications as directed. Please refer to the Current Medication list given to you today.   Labwork: None ordered  Testing/Procedures: None ordered  Follow-Up:  Remote monitoring is used to monitor your Pacemaker of ICD from home. This monitoring reduces the number of office visits required to check your device to one time per year. It allows [place] to keep an eye on the functioning of your device to ensure it is working properly. You are scheduled for a device check from home on 12/19/15. You may send your transmission at any time that day. If you have a wireless device, the transmission will be sent automatically. After your physician reviews your transmission, you will receive a postcard with your next transmission date.   Your physician wants you to follow-up in: 1 year with Dr.Klein You will receive a reminder letter in the mail two months in advance. If you don't receive a letter, please call our office to schedule the follow-up appointment.   Any Other Special Instructions Will Be Listed Below (If Applicable).     If you need a refill on your cardiac medications before your next appointment, please call your pharmacy.

## 2015-09-19 NOTE — Progress Notes (Signed)
Electrophysiology Office Note Date: 09/19/2015  ID:  Lance Smith, Lance Smith 08-17-1931, MRN [id number]  PCP: Lance Lyons, MD Electrophysiologist: Lance Smith  CC: Pacemaker follow-up  Lance Smith is a 82 y.o. male seen today for Dr Lance Smith.  He presents today for routine electrophysiology followup.  Since last being seen in our clinic, the patient reports doing very well.  He denies chest pain, palpitations, dyspnea, PND, orthopnea, nausea, vomiting, dizziness, syncope, edema, weight gain, or early satiety.  Device History: STJ dual chamber PPM implanted 2013 for complete heart block    Past Medical History:  Diagnosis Date  . Adenomatous colon polyp 01/2002  . Arthritis   . CKD (chronic kidney disease) stage 3, GFR 30-59 ml/min   . Complete heart block ([place])    a. 05/2011 s/p SJM Model [id number] Dual Chamber PPM  . COPD (chronic obstructive pulmonary disease) ([place])   . Diabetes mellitus ([place])   . Diastolic CHF ([place])    a. [id number] Echo: EF 55-60%  . DVT (deep venous thrombosis), unspecified laterality    a. previously on coumadin - d/c'd spring of 2013  . Hyperglycemia   . Hyperlipidemia   . Hypertension   . Inferior myocardial infarction ([place]) 08/22/2013  . Steroid dependence   . Tobacco abuse    Past Surgical History:  Procedure Laterality Date  . bilateral inguinal hernia    . MELANOMA EXCISION    . PACEMAKER INSERTION  May 2013  . PERMANENT PACEMAKER INSERTION Left 05/20/2011   Procedure: PERMANENT PACEMAKER INSERTION;  Surgeon: Lance Sprang, MD;  Location: [facility] CATH LAB;  Service: Cardiovascular;  Laterality: Left;    Current Outpatient Prescriptions  Medication Sig Dispense Refill  . Acetaminophen 500 MG coapsule Take 2 capsules by mouth every 6 (six) hours as needed (pain).     . furosemide (LASIX) 40 MG tablet Take 40 mg by mouth daily.    . metFORMIN (GLUCOPHAGE) 500 MG tablet Take 500 mg by mouth daily.    . mometasone-formoterol (DULERA) 200-5 MCG/ACT AERO Inhale 2  puffs into the lungs 2 (two) times daily as needed for wheezing or shortness of breath.     . Multiple Vitamins-Minerals (CENTRUM SILVER ADULT 50+ PO) Take 1 tablet by mouth daily.     . nitroGLYCERIN (NITROSTAT) 0.4 MG SL tablet Place 0.4 mg under the tongue every 5 (five) minutes as needed. For chest pain    . predniSONE (DELTASONE) 10 MG tablet Take 10 mg by mouth every morning.     Marland Kitchen RAPAFLO 8 MG CAPS capsule Take 8 mg by mouth daily with breakfast.     . simvastatin (ZOCOR) 40 MG tablet Take 40 mg by mouth every morning.     Lance Smith 20 MG TABS tablet Take 20 mg by mouth daily.      No current facility-administered medications for this visit.     Allergies:   Tetanus toxoids; Aspirin; and Penicillins   Social History: Social History   Social History  . Marital status: Married    Spouse name: N/A  . Number of children: N/A  . Years of education: N/A   Occupational History  . retired    Social History Main Topics  . Smoking status: Current Every Day Smoker    Packs/day: 2.00    Types: Cigarettes  . Smokeless tobacco: Never Used  . Alcohol use No  . Drug use: No  . Sexual activity: Not Currently   Other Topics Concern  . Not on  file   Social History Narrative  . No narrative on file    Family History: Family History  Problem Relation Age of Onset  . Leukemia Mother   . Colon cancer Neg Hx    Review of Systems: All other systems reviewed and are otherwise negative except as noted above.   Physical Exam: VS:  BP 114/68   Pulse 69   Ht 5\' 9"  (1.753 m)   Wt 168 lb 9.6 oz (76.5 kg)   BMI 24.90 kg/m  , BMI Body mass index is 24.9 kg/m.  GEN- The patient is well appearing, alert and oriented x 3 today.   HEENT: normocephalic, atraumatic; sclera clear, conjunctiva pink; hearing intact; oropharynx clear; neck supple, no JVP Lymph- no cervical lymphadenopathy Lungs- Clear to ausculation bilaterally, normal work of breathing.  No wheezes, rales, rhonchi Heart-  Regular rate and rhythm, no murmurs, rubs or gallops, PMI not laterally displaced GI- soft, non-tender, non-distended, bowel sounds present, no hepatosplenomegaly Extremities- no clubbing, cyanosis, or edema; DP/PT/radial pulses 2+ bilaterally MS- no significant deformity or atrophy Skin- warm and dry, no rash or lesion; PPM pocket well healed Psych- euthymic mood, full affect Neuro- strength and sensation are intact  PPM Interrogation- reviewed in detail today,  See PACEART report  EKG:  EKG is ordered today. The ekg ordered today shows sinus rhythm with ventricular pacing  Recent Labs: No results found for requested labs within last 8760 hours.   Wt Readings from Last 3 Encounters:  09/19/15 168 lb 9.6 oz (76.5 kg)  09/19/14 177 lb 6.4 oz (80.5 kg)  07/31/14 181 lb (82.1 kg)     Other studies Reviewed: Additional studies/ records that were reviewed today include: Dr Lance Smith office notes  Assessment and Plan:  1.  Complete heart block  Normal PPM function See Pace Art report No changes today I have discussed the available STJ firmware patch for cyberhacking with the patient today.  We have discussed the risks associated with the firmware upgrade as well as the theoretical risk for cyberhacking.  After an informed discussion, the patient has decided not to install the firmware patch at this time.   2. Recurrent DVT On chronic OAC with Xarelto No bleeding issues CBC, BMET recently checked by PCP   3.  Paroxysmal atrial fibrillation Burden by device interrogation today 5% V rates controlled Continue Xarelto for CHADS2VASC of 5  4. CAD No recent ischemic symptoms     Current medicines are reviewed at length with the patient today.   The patient does not have concerns regarding his medicines.  The following changes were made today:  none  Labs/ tests ordered today include: none Orders Placed This Encounter  Procedures  . EKG 12-Lead     Disposition:   Follow up  with Lance Smith, Dr Lance Smith 1 year      Signed, Lance Marshall, NP 09/19/2015 11:11 AM  [facility] [address] [place] Lance Smith [address] [phone] (office) [phone] (fax

## 2015-09-26 ENCOUNTER — Ambulatory Visit (INDEPENDENT_AMBULATORY_CARE_PROVIDER_SITE_OTHER): Payer: Medicare Other | Admitting: Neurology

## 2015-09-26 ENCOUNTER — Encounter (INDEPENDENT_AMBULATORY_CARE_PROVIDER_SITE_OTHER): Payer: Self-pay | Admitting: Neurology

## 2015-09-26 DIAGNOSIS — M542 Cervicalgia: Secondary | ICD-10-CM | POA: Diagnosis not present

## 2015-09-26 DIAGNOSIS — R202 Paresthesia of skin: Secondary | ICD-10-CM

## 2015-09-26 DIAGNOSIS — Z0289 Encounter for other administrative examinations: Secondary | ICD-10-CM

## 2015-09-26 NOTE — Procedures (Signed)
   NCS (NERVE CONDUCTION STUDY) WITH EMG (ELECTROMYOGRAPHY) REPORT   STUDY DATE: Sept 8th 2017 PATIENT NAME: Lance Smith DOB: 08/31/1931 MRN: [id number]    TECHNOLOGIST: Laretta Alstrom ELECTROMYOGRAPHER: Marcial Pacas M.D.  CLINICAL INFORMATION:  82 years old right-handed male, with history of chronic neck pain, presented with 3 months history of bilateral hands paresthesia, right worse than left, no significant weakness. There was overall mild improvement.  On examination: Deformity of bilateral hands joint, no significant bilateral upper extremity proximal and distal muscle weakness. Sensory examination showed decreased pinprick at first 3 fingerpads.   FINDINGS: NERVE CONDUCTION STUDY: Bilateral ulnar sensory and motor responses were normal.  Bilateral median sensory response showed moderately prolonged peak latency, right worse than left, with well preserved snap amplitude.  Bilateral median motor responses showed moderately prolonged distal latency, F wave latency, right worse than left, with normal C map amplitude, conduction velocity,   NEEDLE ELECTROMYOGRAPHY: Selective needle examinations were performed at bilateral upper extremity muscles, bilateral cervical paraspinal muscles.   Bilateral abductor pollicis brevis: Normal insertion activity, no spontaneous activity, enlarged motor unit potential, with mildly decreased recruitment patterns.  Needle examination of bilateral pronator teres, biceps, triceps, deltoid, extensor digitorum communis, showed no significant abnormality.  There was no spontaneous activity at bilateral C5-6 and 7.   IMPRESSION:   This is an abnormal study, there is electrodiagnostic evidence of median neuropathy across the wrist, consistent with moderate bilateral carpal tunnel syndromes. There is no evidence of bilateral cervical radiculopathy.    INTERPRETING PHYSICIAN:   Marcial Pacas M.D. Ph.D. [facility] [address], [place], [place] [address] [phone]

## 2015-12-22 ENCOUNTER — Ambulatory Visit (INDEPENDENT_AMBULATORY_CARE_PROVIDER_SITE_OTHER): Payer: Medicare Other | Admitting: *Deleted

## 2015-12-22 DIAGNOSIS — I442 Atrioventricular block, complete: Secondary | ICD-10-CM | POA: Diagnosis not present

## 2015-12-22 NOTE — Progress Notes (Signed)
Remote pacemaker transmission.   

## 2015-12-31 ENCOUNTER — Encounter: Payer: Self-pay | Admitting: Cardiology

## 2016-01-01 LAB — CUP PACEART REMOTE DEVICE CHECK
Battery Voltage: 2.92 V
Brady Statistic AP VP Percent: 25 %
Brady Statistic AP VS Percent: 1 %
Brady Statistic AS VP Percent: 74 %
Brady Statistic AS VS Percent: 1 %
Brady Statistic RV Percent Paced: 99 %
Implantable Lead Implant Date: 20130502
Implantable Lead Location: 753859
Lead Channel Impedance Value: 460 Ohm
Lead Channel Pacing Threshold Amplitude: 0.625 V
Lead Channel Pacing Threshold Amplitude: 1 V
Lead Channel Pacing Threshold Pulse Width: 0.5 ms
Lead Channel Sensing Intrinsic Amplitude: 12 mV
Lead Channel Setting Pacing Amplitude: 0.875
Lead Channel Setting Pacing Pulse Width: 0.5 ms
MDC IDC LEAD IMPLANT DT: 20130502
MDC IDC LEAD LOCATION: 753860
MDC IDC MSMT BATTERY REMAINING LONGEVITY: 94 mo
MDC IDC MSMT BATTERY REMAINING PERCENTAGE: 73 %
MDC IDC MSMT LEADCHNL RA IMPEDANCE VALUE: 380 Ohm
MDC IDC MSMT LEADCHNL RA PACING THRESHOLD PULSEWIDTH: 0.5 ms
MDC IDC MSMT LEADCHNL RA SENSING INTR AMPL: 5 mV
MDC IDC PG IMPLANT DT: 20130502
MDC IDC SESS DTM: 20171201162333
MDC IDC SET LEADCHNL RA PACING AMPLITUDE: 2 V
MDC IDC SET LEADCHNL RV SENSING SENSITIVITY: 4 mV
MDC IDC STAT BRADY RA PERCENT PACED: 19 %
Pulse Gen Serial Number: 7321242

## 2016-03-22 ENCOUNTER — Ambulatory Visit (INDEPENDENT_AMBULATORY_CARE_PROVIDER_SITE_OTHER): Payer: Medicare Other | Admitting: *Deleted

## 2016-03-22 ENCOUNTER — Telehealth: Payer: Self-pay | Admitting: Cardiology

## 2016-03-22 DIAGNOSIS — I442 Atrioventricular block, complete: Secondary | ICD-10-CM | POA: Diagnosis not present

## 2016-03-22 NOTE — Telephone Encounter (Signed)
Confirmed remote transmission w/ pt wife.   

## 2016-03-23 ENCOUNTER — Encounter: Payer: Self-pay | Admitting: Cardiology

## 2016-03-23 LAB — CUP PACEART REMOTE DEVICE CHECK
Battery Remaining Longevity: 93 mo
Brady Statistic AP VS Percent: 1 %
Brady Statistic AS VP Percent: 72 %
Brady Statistic AS VS Percent: 1 %
Brady Statistic RA Percent Paced: 22 %
Brady Statistic RV Percent Paced: 99 %
Implantable Lead Implant Date: 20130502
Implantable Lead Location: 753860
Lead Channel Impedance Value: 380 Ohm
Lead Channel Impedance Value: 460 Ohm
Lead Channel Pacing Threshold Amplitude: 0.625 V
Lead Channel Pacing Threshold Amplitude: 1 V
Lead Channel Pacing Threshold Pulse Width: 0.5 ms
Lead Channel Sensing Intrinsic Amplitude: 12 mV
Lead Channel Setting Pacing Amplitude: 2 V
Lead Channel Setting Pacing Pulse Width: 0.5 ms
MDC IDC LEAD IMPLANT DT: 20130502
MDC IDC LEAD LOCATION: 753859
MDC IDC MSMT BATTERY REMAINING PERCENTAGE: 73 %
MDC IDC MSMT BATTERY VOLTAGE: 2.92 V
MDC IDC MSMT LEADCHNL RA PACING THRESHOLD PULSEWIDTH: 0.5 ms
MDC IDC MSMT LEADCHNL RA SENSING INTR AMPL: 5 mV
MDC IDC PG IMPLANT DT: 20130502
MDC IDC PG SERIAL: 7321242
MDC IDC SESS DTM: 20180305201932
MDC IDC SET LEADCHNL RV PACING AMPLITUDE: 0.875
MDC IDC SET LEADCHNL RV SENSING SENSITIVITY: 4 mV
MDC IDC STAT BRADY AP VP PERCENT: 28 %

## 2016-03-23 NOTE — Progress Notes (Signed)
Remote pacemaker transmission.   

## 2016-06-21 ENCOUNTER — Ambulatory Visit (INDEPENDENT_AMBULATORY_CARE_PROVIDER_SITE_OTHER): Payer: Medicare Other | Admitting: *Deleted

## 2016-06-21 ENCOUNTER — Telehealth: Payer: Self-pay | Admitting: Cardiology

## 2016-06-21 DIAGNOSIS — I442 Atrioventricular block, complete: Secondary | ICD-10-CM | POA: Diagnosis not present

## 2016-06-21 NOTE — Telephone Encounter (Signed)
Spoke with pt and reminded pt of remote transmission that is due today. Pt verbalized understanding.   

## 2016-06-22 NOTE — Progress Notes (Signed)
Remote pacemaker transmission.   

## 2016-06-23 LAB — CUP PACEART REMOTE DEVICE CHECK
Battery Voltage: 2.92 V
Brady Statistic AP VP Percent: 29 %
Brady Statistic AP VS Percent: 1 %
Brady Statistic AS VP Percent: 70 %
Brady Statistic AS VS Percent: 1 %
Brady Statistic RA Percent Paced: 23 %
Brady Statistic RV Percent Paced: 99 %
Date Time Interrogation Session: 20180604164356
Implantable Lead Implant Date: 20130502
Implantable Lead Location: 753859
Implantable Lead Location: 753860
Lead Channel Impedance Value: 380 Ohm
Lead Channel Impedance Value: 450 Ohm
Lead Channel Pacing Threshold Amplitude: 0.625 V
Lead Channel Pacing Threshold Pulse Width: 0.5 ms
Lead Channel Sensing Intrinsic Amplitude: 12 mV
Lead Channel Sensing Intrinsic Amplitude: 4.6 mV
Lead Channel Setting Pacing Amplitude: 0.875
Lead Channel Setting Pacing Amplitude: 2 V
Lead Channel Setting Pacing Pulse Width: 0.5 ms
Lead Channel Setting Sensing Sensitivity: 4 mV
MDC IDC LEAD IMPLANT DT: 20130502
MDC IDC MSMT BATTERY REMAINING LONGEVITY: 92 mo
MDC IDC MSMT BATTERY REMAINING PERCENTAGE: 73 %
MDC IDC MSMT LEADCHNL RA PACING THRESHOLD AMPLITUDE: 1 V
MDC IDC MSMT LEADCHNL RA PACING THRESHOLD PULSEWIDTH: 0.5 ms
MDC IDC PG IMPLANT DT: 20130502
Pulse Gen Model: 2110
Pulse Gen Serial Number: 7321242

## 2016-06-29 ENCOUNTER — Encounter: Payer: Self-pay | Admitting: Cardiology

## 2016-09-08 ENCOUNTER — Encounter: Payer: Self-pay | Admitting: Internal Medicine

## 2016-09-21 ENCOUNTER — Ambulatory Visit (INDEPENDENT_AMBULATORY_CARE_PROVIDER_SITE_OTHER): Payer: Medicare Other | Admitting: *Deleted

## 2016-09-21 DIAGNOSIS — I442 Atrioventricular block, complete: Secondary | ICD-10-CM

## 2016-09-23 NOTE — Progress Notes (Signed)
Remote pacemaker transmission.   

## 2016-09-29 ENCOUNTER — Ambulatory Visit (INDEPENDENT_AMBULATORY_CARE_PROVIDER_SITE_OTHER): Payer: Medicare Other | Admitting: Internal Medicine

## 2016-09-29 ENCOUNTER — Encounter: Payer: Self-pay | Admitting: Internal Medicine

## 2016-09-29 ENCOUNTER — Encounter: Payer: Self-pay | Admitting: Cardiology

## 2016-09-29 VITALS — BP 120/61 | HR 73 | Ht 69.0 in | Wt 171.0 lb

## 2016-09-29 DIAGNOSIS — R0602 Shortness of breath: Secondary | ICD-10-CM | POA: Diagnosis not present

## 2016-09-29 DIAGNOSIS — I480 Paroxysmal atrial fibrillation: Secondary | ICD-10-CM

## 2016-09-29 DIAGNOSIS — Z950 Presence of cardiac pacemaker: Secondary | ICD-10-CM

## 2016-09-29 DIAGNOSIS — I442 Atrioventricular block, complete: Secondary | ICD-10-CM

## 2016-09-29 LAB — CUP PACEART INCLINIC DEVICE CHECK
Battery Remaining Longevity: 94 mo
Battery Voltage: 2.92 V
Battery Voltage: 2.92 V
Brady Statistic RA Percent Paced: 25 %
Brady Statistic RA Percent Paced: 25 %
Brady Statistic RV Percent Paced: 99.24 %
Date Time Interrogation Session: 20180912110012
Implantable Lead Implant Date: 20130502
Implantable Lead Implant Date: 20130502
Implantable Lead Implant Date: 20130502
Implantable Lead Location: 753859
Implantable Lead Location: 753859
Implantable Lead Location: 753860
Implantable Pulse Generator Implant Date: 20130502
Lead Channel Pacing Threshold Amplitude: 0.75 V
Lead Channel Pacing Threshold Amplitude: 0.75 V
Lead Channel Pacing Threshold Amplitude: 0.75 V
Lead Channel Pacing Threshold Amplitude: 0.75 V
Lead Channel Pacing Threshold Amplitude: 0.75 V
Lead Channel Pacing Threshold Amplitude: 0.75 V
Lead Channel Pacing Threshold Pulse Width: 0.5 ms
Lead Channel Pacing Threshold Pulse Width: 0.5 ms
Lead Channel Pacing Threshold Pulse Width: 0.5 ms
Lead Channel Pacing Threshold Pulse Width: 0.5 ms
Lead Channel Pacing Threshold Pulse Width: 0.5 ms
Lead Channel Sensing Intrinsic Amplitude: 12 mV
Lead Channel Sensing Intrinsic Amplitude: 12 mV
Lead Channel Sensing Intrinsic Amplitude: 5 mV
Lead Channel Setting Pacing Amplitude: 0.875
Lead Channel Setting Pacing Amplitude: 2 V
Lead Channel Setting Pacing Amplitude: 0.875
Lead Channel Setting Pacing Amplitude: 2 V
Lead Channel Setting Pacing Pulse Width: 0.5 ms
Lead Channel Setting Pacing Pulse Width: 0.5 ms
Lead Channel Setting Sensing Sensitivity: 4 mV
Lead Channel Setting Sensing Sensitivity: 4 mV
MDC IDC LEAD IMPLANT DT: 20130502
MDC IDC LEAD LOCATION: 753860
MDC IDC MSMT BATTERY REMAINING LONGEVITY: 94 mo
MDC IDC MSMT LEADCHNL RA IMPEDANCE VALUE: 375 Ohm
MDC IDC MSMT LEADCHNL RA IMPEDANCE VALUE: 375 Ohm
MDC IDC MSMT LEADCHNL RA PACING THRESHOLD AMPLITUDE: 0.75 V
MDC IDC MSMT LEADCHNL RA PACING THRESHOLD PULSEWIDTH: 0.5 ms
MDC IDC MSMT LEADCHNL RA PACING THRESHOLD PULSEWIDTH: 0.5 ms
MDC IDC MSMT LEADCHNL RA SENSING INTR AMPL: 5 mV
MDC IDC MSMT LEADCHNL RV IMPEDANCE VALUE: 450 Ohm
MDC IDC MSMT LEADCHNL RV IMPEDANCE VALUE: 450 Ohm
MDC IDC MSMT LEADCHNL RV PACING THRESHOLD AMPLITUDE: 0.75 V
MDC IDC MSMT LEADCHNL RV PACING THRESHOLD PULSEWIDTH: 0.5 ms
MDC IDC PG IMPLANT DT: 20130502
MDC IDC SESS DTM: 20180912110118
MDC IDC STAT BRADY RV PERCENT PACED: 99.24 %
Pulse Gen Model: 2110
Pulse Gen Model: 2110
Pulse Gen Serial Number: 7321242
Pulse Gen Serial Number: 7321242

## 2016-09-29 LAB — CBC WITH DIFFERENTIAL/PLATELET
BASOS ABS: 0.1 10*3/uL (ref 0.0–0.2)
Basos: 0 %
EOS (ABSOLUTE): 0.5 10*3/uL — ABNORMAL HIGH (ref 0.0–0.4)
Eos: 3 %
HEMATOCRIT: 45.1 % (ref 37.5–51.0)
Hemoglobin: 15.8 g/dL (ref 13.0–17.7)
Immature Grans (Abs): 0.1 10*3/uL (ref 0.0–0.1)
Immature Granulocytes: 1 %
LYMPHS ABS: 1.7 10*3/uL (ref 0.7–3.1)
Lymphs: 11 %
MCH: 35 pg — ABNORMAL HIGH (ref 26.6–33.0)
MCHC: 35 g/dL (ref 31.5–35.7)
MCV: 100 fL — ABNORMAL HIGH (ref 79–97)
MONOS ABS: 1.3 10*3/uL — AB (ref 0.1–0.9)
Monocytes: 8 %
NEUTROS ABS: 12.1 10*3/uL — AB (ref 1.4–7.0)
Neutrophils: 77 %
Platelets: 272 10*3/uL (ref 150–379)
RBC: 4.51 x10E6/uL (ref 4.14–5.80)
RDW: 13.2 % (ref 12.3–15.4)
WBC: 15.7 10*3/uL — ABNORMAL HIGH (ref 3.4–10.8)

## 2016-09-29 LAB — BASIC METABOLIC PANEL
BUN / CREAT RATIO: 16 (ref 10–24)
BUN: 20 mg/dL (ref 8–27)
CO2: 22 mmol/L (ref 20–29)
Calcium: 9.3 mg/dL (ref 8.6–10.2)
Chloride: 102 mmol/L (ref 96–106)
Creatinine, Ser: 1.28 mg/dL — ABNORMAL HIGH (ref 0.76–1.27)
GFR calc Af Amer: 59 mL/min/{1.73_m2} — ABNORMAL LOW (ref >59)
GFR, EST NON AFRICAN AMERICAN: 51 mL/min/{1.73_m2} — AB (ref >59)
Glucose: 134 mg/dL — ABNORMAL HIGH (ref 65–99)
Potassium: 4.3 mmol/L (ref 3.5–5.2)
SODIUM: 142 mmol/L (ref 134–144)

## 2016-09-29 NOTE — Patient Instructions (Signed)
Medication Instructions: - Your physician recommends that you continue on your current medications as directed. Please refer to the Current Medication list given to you today.  Labwork: - Your physician recommends that you have lab work today: BMP/ CBC  Procedures/Testing: - Your physician has requested that you have an echocardiogram. Echocardiography is a painless test that uses sound waves to create images of your heart. It provides your doctor with information about the size and shape of your heart and how well your heart's chambers and valves are working. This procedure takes approximately one hour. There are no restrictions for this procedure.  Follow-Up: - Remote monitoring is used to monitor your Pacemaker of ICD from home. This monitoring reduces the number of office visits required to check your device to one time per year. It allows [place] to keep an eye on the functioning of your device to ensure it is working properly. You are scheduled for a device check from home on 12/21/16. You may send your transmission at any time that day. If you have a wireless device, the transmission will be sent automatically. After your physician reviews your transmission, you will receive a postcard with your next transmission date.  - Your physician wants you to follow-up in: 6 months with Tommye Standard, PA for Dr. Caryl Comes. You will receive a reminder letter in the mail two months in advance. If you don't receive a letter, please call our office to schedule the follow-up appointment.   Any Additional Special Instructions Will Be Listed Below (If Applicable).     If you need a refill on your cardiac medications before your next appointment, please call your pharmacy.

## 2016-09-29 NOTE — Progress Notes (Signed)
Patient Care Team: Burnard Bunting, MD as PCP - General (Internal Medicine) Ladene Artist, MD as Consulting Physician (Gastroenterology) Deboraha Sprang, MD as Consulting Physician (Cardiology)   HPI  Lance Smith is a 83 y.o. male Seen following pacemaker implantation May 2013 for complete heart block. Echocardiogram at that time demonstrated an ejection fraction 55-60%  no syncope Still smoking  He denie schest discomfort but is complaining of quite limiting SOB w exertion  He has daily edema    DATE TEST    5/13    Echo   EF 55-65 %   6/15 Myoview   EF 51 % fixed, medium-sized moderate basal inferior and inferoseptal perfusion defect and a fixed small, mild mid inferolateral perfusion defect.           He also has history of recurrent lower extremity DVT; he says this is #3; he is   on chronic anticoagulation with Rivaroxaban Date Cr Hgb  6/15 1.36 14.1        No bleeding issues     Past Medical History:  Diagnosis Date  . Adenomatous colon polyp 01/2002  . Arthritis   . CKD (chronic kidney disease) stage 3, GFR 30-59 ml/min   . Complete heart block ([place])    a. 05/2011 s/p SJM Model [id number] Dual Chamber PPM  . COPD (chronic obstructive pulmonary disease) ([place])   . Diabetes mellitus ([place])   . Diastolic CHF ([place])    a. 04/4313 Echo: EF 55-60%  . DVT (deep venous thrombosis), unspecified laterality    a. previously on coumadin - d/c'd spring of 2013  . Hyperglycemia   . Hyperlipidemia   . Hypertension   . Inferior myocardial infarction ([place]) 08/22/2013  . Steroid dependence   . Tobacco abuse     Past Surgical History:  Procedure Laterality Date  . bilateral inguinal hernia    . MELANOMA EXCISION    . PACEMAKER INSERTION  May 2013  . PERMANENT PACEMAKER INSERTION Left 05/20/2011   Procedure: PERMANENT PACEMAKER INSERTION;  Surgeon: Deboraha Sprang, MD;  Location: [facility] CATH LAB;  Service: Cardiovascular;  Laterality: Left;    Current Outpatient  Prescriptions  Medication Sig Dispense Refill  . Acetaminophen 500 MG coapsule Take 2 capsules by mouth every 6 (six) hours as needed (pain).     . furosemide (LASIX) 40 MG tablet Take 40 mg by mouth daily.    . metFORMIN (GLUCOPHAGE) 500 MG tablet Take 500 mg by mouth daily.    . mometasone-formoterol (DULERA) 200-5 MCG/ACT AERO Inhale 2 puffs into the lungs 2 (two) times daily as needed for wheezing or shortness of breath.     . Multiple Vitamins-Minerals (CENTRUM SILVER ADULT 50+ PO) Take 1 tablet by mouth daily.     . nitroGLYCERIN (NITROSTAT) 0.4 MG SL tablet Place 0.4 mg under the tongue every 5 (five) minutes as needed. For chest pain    . predniSONE (DELTASONE) 10 MG tablet Take 10 mg by mouth every morning.     Marland Kitchen RAPAFLO 8 MG CAPS capsule Take 8 mg by mouth daily with breakfast.     . simvastatin (ZOCOR) 40 MG tablet Take 40 mg by mouth every morning.     Alveda Reasons 20 MG TABS tablet Take 20 mg by mouth daily.      No current facility-administered medications for this visit.     Allergies  Allergen Reactions  . Tetanus Toxoids Shortness Of Breath and Swelling  . Aspirin  Nausea Only and Other (See Comments)    headache  . Penicillins Swelling    Review of Systems negative except from HPI and PMH  Physical Exam BP 120/61   Pulse 73   Ht 5\' 9"  (1.753 m)   Wt 171 lb (77.6 kg)   SpO2 94%   BMI 25.25 kg/m  Well developed and nourished in no acute distress HENT normal Neck supple with JVP 7Carotids brisk and full without bruits Clear but decreased Regular rate and rhythm, no murmurs or gallops Abd-soft with active BS without hepatomegaly No Clubbing cyanosis tr edema Skin-warm and dry A & Oriented  Grossly normal sensory and motor function  ECG demonstrates sinus @ 62 with P-synchronous/ AV  pacing   Assessment and  Plan  Complete heart block  CAD  Prior MI  Pacemaker-St. Jude  DVT-recurrent  Daytime somnolence/obstructive nocturnal  breathing  DOE  Paroxysmal atrial arrhythmia Atrial rhythms are quiet. Device function is normal.  Pts SOB has broad differential inclu COPD, CHF either systolic or diastolic, related to ? CAD based on myoview, pacing induced cardiomyopathy, anemia  Will check blood work on Rivaroxaban   Will check echo As above  May need myoview vs Cath  For now will hold off on increasing diuretic until we know renal function

## 2016-09-30 ENCOUNTER — Telehealth: Payer: Self-pay | Admitting: Internal Medicine

## 2016-09-30 NOTE — Telephone Encounter (Signed)
I spoke with the patient and he gave ok for me to speak with his wife regarding results as he could not hear on the phone. The patient's wife is aware of all results and that a copy will be forwarded to Dr. Reynaldo Minium for follow up of mild anemia. Prior to calling the patient Dr. Caryl Comes did give a verbal order for the patient to increase lasix to 80 mg once every other day alternating with 40 mg once every other day x 1 week, then resume lasix 40 mg once daily. I have advised the patient's wife to call back after a week if SOB is not improved. The patient is also scheduled for a follow up echo on 10/08/16.

## 2016-10-04 LAB — CUP PACEART REMOTE DEVICE CHECK
Battery Remaining Longevity: 78 mo
Battery Remaining Percentage: 65 %
Battery Voltage: 2.9 V
Date Time Interrogation Session: 20180917153828
Implantable Lead Implant Date: 20130502
Implantable Lead Implant Date: 20130502
Implantable Lead Location: 753859
Implantable Pulse Generator Implant Date: 20130502
Lead Channel Impedance Value: 380 Ohm
Lead Channel Impedance Value: 450 Ohm
Lead Channel Pacing Threshold Amplitude: 0.625 V
Lead Channel Pacing Threshold Pulse Width: 0.5 ms
Lead Channel Setting Pacing Amplitude: 2 V
Lead Channel Setting Sensing Sensitivity: 4 mV
MDC IDC LEAD LOCATION: 753860
MDC IDC MSMT LEADCHNL RA SENSING INTR AMPL: 5 mV
MDC IDC PG SERIAL: 7321242
MDC IDC SET LEADCHNL RV PACING AMPLITUDE: 0.875
MDC IDC SET LEADCHNL RV PACING PULSEWIDTH: 0.5 ms
MDC IDC STAT BRADY RA PERCENT PACED: 25 %
MDC IDC STAT BRADY RV PERCENT PACED: 99 % — AB
Pulse Gen Model: 2110

## 2016-10-08 ENCOUNTER — Other Ambulatory Visit: Payer: Self-pay

## 2016-10-08 ENCOUNTER — Ambulatory Visit (HOSPITAL_COMMUNITY): Payer: Medicare Other | Attending: Cardiology

## 2016-10-08 DIAGNOSIS — I420 Dilated cardiomyopathy: Secondary | ICD-10-CM | POA: Insufficient documentation

## 2016-10-08 DIAGNOSIS — I5030 Unspecified diastolic (congestive) heart failure: Secondary | ICD-10-CM | POA: Insufficient documentation

## 2016-10-08 DIAGNOSIS — I069 Rheumatic aortic valve disease, unspecified: Secondary | ICD-10-CM | POA: Diagnosis not present

## 2016-10-08 DIAGNOSIS — R0602 Shortness of breath: Secondary | ICD-10-CM

## 2016-10-15 ENCOUNTER — Telehealth: Payer: Self-pay | Admitting: Internal Medicine

## 2016-10-15 MED ORDER — FUROSEMIDE 40 MG PO TABS: ORAL_TABLET | ORAL | Status: DC

## 2016-10-15 NOTE — Telephone Encounter (Signed)
Am glad that edema is better. The COPD from smoking may well be the major issue. The event that he has worsening edema I would increase his Lasix from 40 in the morning to 80 in the morning on an as-needed basis

## 2016-10-15 NOTE — Telephone Encounter (Signed)
I spoke with Mrs. Hingle and advised her of Dr. Olin Pia recommendations. For PRN lasix.  I have also advised her to weigh the patient daily and if his weight goes up 3 lbs or more in 24 or 5 lbs in 1 week to call the office. She verbalizes understanding.

## 2016-10-15 NOTE — Telephone Encounter (Signed)
Follow Up:    Pt would like his Echo results from 09-29-16 please.

## 2016-10-15 NOTE — Telephone Encounter (Signed)
Notes recorded by Emily Filbert, RN on 10/15/2016 at 5:27 PM EDT The patient's wife is aware of his echo results.  His swelling is much improved after taking increased lasix 40 mg once every other day alternating with 80 mg once every other day x 1 week. He is currently back on his current dose of lasix 40 mg once daily. SOB is about the same. Per Mrs. Turner, the patient's urine output did not seem much different during the increased dose of lasix.  I advised her I will review with Dr. Caryl Comes and call her back today. She is agreeable. ------  Notes recorded by Deboraha Sprang, MD on 10/11/2016 at 2:00 PM EDT Please Inform Patient Echo showed normal heart muscle function  with some degree of heart muscle thickening  This may give rise to some diastolic dysfunction so we should be more aggressive about diuresis-- we await a call back from wife after we increased his diuretics

## 2016-12-21 ENCOUNTER — Ambulatory Visit (INDEPENDENT_AMBULATORY_CARE_PROVIDER_SITE_OTHER): Payer: Medicare Other | Admitting: *Deleted

## 2016-12-21 DIAGNOSIS — I442 Atrioventricular block, complete: Secondary | ICD-10-CM | POA: Diagnosis not present

## 2016-12-22 ENCOUNTER — Encounter: Payer: Self-pay | Admitting: Cardiology

## 2016-12-22 LAB — CUP PACEART REMOTE DEVICE CHECK
Battery Remaining Longevity: 83 mo
Battery Remaining Percentage: 65 %
Brady Statistic AP VP Percent: 34 %
Brady Statistic AP VS Percent: 1 %
Brady Statistic AS VP Percent: 65 %
Brady Statistic AS VS Percent: 1 %
Date Time Interrogation Session: 20181204174227
Implantable Lead Implant Date: 20130502
Implantable Lead Implant Date: 20130502
Implantable Lead Location: 753859
Implantable Lead Location: 753860
Lead Channel Impedance Value: 380 Ohm
Lead Channel Impedance Value: 450 Ohm
Lead Channel Pacing Threshold Amplitude: 0.5 V
Lead Channel Pacing Threshold Pulse Width: 0.5 ms
Lead Channel Sensing Intrinsic Amplitude: 12 mV
Lead Channel Sensing Intrinsic Amplitude: 3.8 mV
Lead Channel Setting Pacing Amplitude: 0.75 V
Lead Channel Setting Pacing Amplitude: 2 V
MDC IDC MSMT BATTERY VOLTAGE: 2.9 V
MDC IDC MSMT LEADCHNL RA PACING THRESHOLD AMPLITUDE: 0.75 V
MDC IDC MSMT LEADCHNL RA PACING THRESHOLD PULSEWIDTH: 0.5 ms
MDC IDC PG IMPLANT DT: 20130502
MDC IDC PG SERIAL: 7321242
MDC IDC SET LEADCHNL RV PACING PULSEWIDTH: 0.5 ms
MDC IDC SET LEADCHNL RV SENSING SENSITIVITY: 4 mV
MDC IDC STAT BRADY RA PERCENT PACED: 31 %
MDC IDC STAT BRADY RV PERCENT PACED: 99 %
Pulse Gen Model: 2110

## 2016-12-22 NOTE — Progress Notes (Signed)
Remote pacemaker transmission.   

## 2017-03-22 ENCOUNTER — Telehealth: Payer: Self-pay | Admitting: Cardiology

## 2017-03-22 ENCOUNTER — Encounter: Payer: Medicare Other | Admitting: *Deleted

## 2017-03-22 NOTE — Telephone Encounter (Signed)
LMOVM reminding pt to send remote transmission.   

## 2017-03-24 ENCOUNTER — Encounter: Payer: Self-pay | Admitting: Cardiology

## 2017-03-24 NOTE — Progress Notes (Signed)
Letter  

## 2017-03-31 ENCOUNTER — Telehealth: Payer: Self-pay | Admitting: Physician Assistant

## 2017-03-31 NOTE — Telephone Encounter (Signed)
Confirmed with patient's wife that we successfully received patients remote transmission.

## 2017-03-31 NOTE — Telephone Encounter (Signed)
°  1. Has your device fired? no ° °2. Is you device beeping? no ° °3. Are you experiencing draining or swelling at device site?no ° °4. Are you calling to see if we received your device transmission?yes ° °5. Have you passed out? no ° ° ° °Please route to Device Clinic Pool °

## 2017-04-07 ENCOUNTER — Telehealth: Payer: Self-pay | Admitting: Internal Medicine

## 2017-04-07 NOTE — Telephone Encounter (Signed)
New Message  Pt c/o swelling: STAT is pt has developed SOB within 24 hours  1) How much weight have you gained and in what time span? 5 lbs in 24 hrs, looses it in the mornings but gets it right back  2) If swelling, where is the swelling located? Legs and feet  3) Are you currently taking a fluid pill? yes  4) Are you currently SOB? yes  5) Do you have a log of your daily weights (if so, list)? No, but says she can start taking it  6) Have you gained 3 pounds in a day or 5 pounds in a week? yes  7) Have you traveled recently? no

## 2017-04-07 NOTE — Telephone Encounter (Signed)
Spoke with patient in regards to his weight gain and Lasix usage.Marland Kitchen He is gaining excessive weight and is swelling in the legs and feet..  I informed him that he may take an extra 40 mg for swelling and shortness of breath per instructions.Marland Kitchen  He will try this throughout the weekend, keep a weight log and inform us on Monday, hopefully with progress.Marland Kitchen

## 2017-04-11 ENCOUNTER — Telehealth: Payer: Self-pay | Admitting: Internal Medicine

## 2017-04-11 NOTE — Telephone Encounter (Signed)
New Message:    Pt calling to give a update on his fluid as advised on last week.

## 2017-04-12 NOTE — Telephone Encounter (Signed)
Spoke with pt this morning. He states he has been "doubling up" on his lasix since 3/21. Since then he has lost 8lbs. I advised him to switch back to his normal dose and call if he continues to swell. He also stated that he has "shooting pains" down his left leg and has a hx of DVTs in that leg which he is on Xarelto for. I told him if his leg pain continues to call back and I would notify Dr Caryl Comes. I also suggested he watch his sodium and additional fluid intake; also to put his feet up in the afternoon when he notices the swelling. He stated he would. I asked him to call back with any increased SOB or sustained LLE pain. He verbalized understanding and had no additional questions.

## 2017-05-10 NOTE — Progress Notes (Signed)
Cardiology Office Note Date:  05/11/2017  Patient ID:  Lance, Smith 1931/07/24, MRN [id number] PCP:  Burnard Bunting, MD  Cardiologist:  Dr. Caryl Comes    Chief Complaint: 6 month f/u  History of Present Illness: Lance Smith is a 84 y.o. male with history of CHB w/PPM, CRI (III), COPD, DM, chronic CHF (diastolic), recurrent DVT, PAFib, on Xarelto HLD, HTN, CAD.  He comes in today to be seen for dr. Caryl Comes.  Last seen by him in Sept 2018, at that visit with limiting DOE, planned for echo, possible stress/cath, labs. Echo noted mod LVH including asymmetric hypertrophy, his lasix adjusted up temporarily and then instructed for PRN use, H/H was OK, BMET stable.  Referred to his PMD for evaluation of ? Mild amemia and elevated BS.    He comes today accompanied by his wife.  He feels like he is doing well, though concerned about his ankle swelling.  He mentions chest ache that he has had on/off randomly for the lat 20 years unchanged, not positional or exertional, lasts a couple minutes and goes away.  No change in it's behavior, frequency or duration for "20 or more years".  No palpitations, no dizziness, near syncope or syncope.  He has some baseline SOB with his COPD, pollen can exacerbate this.  No change in this.  No symptoms of PND or orthopnea.  He denies bleeding or signs of bleeding.  His only concern is ankle swelling.  This is not new though has become more persistent.  Wakes with minimal swelling if any usually that accumualates as the day progresses, reports his AM weights have been stable at about 171lbs for the last several months, he also weghs at night and is usually 172-173lbs.     Device information: SJM dual chamber PPM, implanted 05/20/11   Past Medical History:  Diagnosis Date  . Adenomatous colon polyp 01/2002  . Arthritis   . CKD (chronic kidney disease) stage 3, GFR 30-59 ml/min   . Complete heart block ([place])    a. 05/2011 s/p SJM Model [id number] Dual Chamber PPM  . COPD  (chronic obstructive pulmonary disease) ([place])   . Diabetes mellitus ([place])   . Diastolic CHF ([place])    a. 01/3084 Echo: EF 55-60%  . DVT (deep venous thrombosis), unspecified laterality    a. previously on coumadin - d/c'd spring of 2013  . Hyperglycemia   . Hyperlipidemia   . Hypertension   . Inferior myocardial infarction ([place]) 08/22/2013  . Steroid dependence   . Tobacco abuse     Past Surgical History:  Procedure Laterality Date  . bilateral inguinal hernia    . MELANOMA EXCISION    . PACEMAKER INSERTION  May 2013  . PERMANENT PACEMAKER INSERTION Left 05/20/2011   Procedure: PERMANENT PACEMAKER INSERTION;  Surgeon: Deboraha Sprang, MD;  Location: [facility] CATH LAB;  Service: Cardiovascular;  Laterality: Left;    Current Outpatient Medications  Medication Sig Dispense Refill  . Acetaminophen 500 MG coapsule Take 2 capsules by mouth every 6 (six) hours as needed (pain).     . furosemide (LASIX) 40 MG tablet Take 1 tablet (40 mg) by mouth once daily, you may take an extra tablet (40 mg) daily as needed for increased swelling/ shortness of breath    . metFORMIN (GLUCOPHAGE) 500 MG tablet Take 500 mg by mouth daily.    . mometasone-formoterol (DULERA) 200-5 MCG/ACT AERO Inhale 2 puffs into the lungs 2 (two) times daily as needed for  wheezing or shortness of breath.     . Multiple Vitamins-Minerals (CENTRUM SILVER ADULT 50+ PO) Take 1 tablet by mouth daily.     . nitroGLYCERIN (NITROSTAT) 0.4 MG SL tablet Place 0.4 mg under the tongue every 5 (five) minutes as needed. For chest pain    . predniSONE (DELTASONE) 10 MG tablet Take 10 mg by mouth every morning.     Marland Kitchen RAPAFLO 8 MG CAPS capsule Take 8 mg by mouth daily with breakfast.     . simvastatin (ZOCOR) 40 MG tablet Take 40 mg by mouth every morning.     Alveda Reasons 20 MG TABS tablet Take 20 mg by mouth daily.      No current facility-administered medications for this visit.     Allergies:   Tetanus toxoids; Aspirin; and Penicillins   Social  History:  The patient  reports that he has been smoking cigarettes.  He has been smoking about 2.00 packs per day. He has never used smokeless tobacco. He reports that he does not drink alcohol or use drugs.   Family History:  The patient's family history includes Leukemia in his mother.  ROS:  Please see the history of present illness.  All other systems are reviewed and otherwise negative.   PHYSICAL EXAM:  VS:  Ht 5\' 9"  (1.753 m)   BMI 25.25 kg/m  BMI: Body mass index is 25.25 kg/m. Well nourished, well developed, in no acute distress  HEENT: normocephalic, atraumatic  Neck: no JVD, carotid bruits or masses Cardiac:  RRR; 1/6 SM, no rubs, or gallops Lungs:  CTA b/l, no wheezing, rhonchi or rales  Abd: soft, nontender MS: no deformity, age appropriate atrophy Ext: trace edema  Skin: warm and dry, no rash Neuro:  No gross deficits appreciated Psych: euthymic mood, full affect  PPM site is stable, no tethering or discomfort   EKG:  Not done today PPM interrogation done today and reviewed bymyself: battery and lead measurements are good, No R waves at 40bpm.  AF burden is 9.1%  10/08/16: TTE Study Conclusions - Left ventricle: The cavity size was normal. There was moderate   concentric and severe asymmetric hypertrophy of the mid and   distal septum as well as the mid and apical inferior wall and   apex. Consider cardiac MRI to rule out HOCM. Systolic function   was normal. The estimated ejection fraction was in the range of   55% to 60%. Wall motion was normal; there were no regional wall   motion abnormalities. Features are consistent with a pseudonormal   left ventricular filling pattern, with concomitant abnormal   relaxation and increased filling pressure (grade 2 diastolic   dysfunction). Doppler parameters are consistent with high   ventricular filling pressure. - Aortic valve: Trileaflet; mildly thickened, mildly calcified   leaflets. There was trivial  regurgitation. - Mitral valve: Calcified annulus. - Left atrium: The atrium was mildly dilated. - Impressions: Normal LVF with EF 55-60% with moderate concentric   hypertrophy and severe asymmetric hypertrophy of the mid and   distal septum, mid and distal inferior wall and apex. Consider   cardiac MRI to assess for HOCM variant. Impressions: - Normal LVF with EF 55-60% with moderate concentric hypertrophy   and severe asymmetric hypertrophy of the mid and distal septum,   mid and distal inferior wall and apex. Consider cardiac MRI to   assess for HOCM variant.  06/26/13: stress myoview Overall Impression:  Low risk stress nuclear study with a fixed,  medium-sized moderate basal inferior and inferoseptal perfusion defect and a fixed small, mild mid inferolateral perfusion defect.  There was no ischemia (SDS 1).  Given relatively normal wall motion in the areas of the defects, cannot rule out attenuation. . LV Ejection Fraction: 51%.  LV Wall Motion:  Apical hypokinesis  Recent Labs: 09/29/2016: BUN 20; Creatinine, Ser 1.28; Hemoglobin 15.8; Platelets 272; Potassium 4.3; Sodium 142  No results found for requested labs within last 8760 hours.   CrCl cannot be calculated (Patient's most recent lab result is older than the maximum 21 days allowed.).   Wt Readings from Last 3 Encounters:  09/29/16 171 lb (77.6 kg)  09/19/15 168 lb 9.6 oz (76.5 kg)  09/19/14 177 lb 6.4 oz (80.5 kg)     Other studies reviewed: Additional studies/records reviewed today include: summarized above  ASSESSMENT AND PLAN:  1. PPM     Intact function, no changes made  2. Chronic CHF (diastolic) 3. Mod concentric [place], with asymmetrical hypertrophy as well     Exam today does not suggest fluid OL, he describes more of a dependent edema, that by the end of the day has rouble with his shoes being too tight  Recommend support stockings, though he is stern in that he can not tolerate this with significant peripheral  neuropathy after an old back injury.  Will increase his lasix to 40mg  AM, 20mg  afternoons, repeat BMET in 10 days  3. HTN     Looks ok  4. Recurrent DVT     On Xarelto     BMET/CBC today  5. CAD     Unclear to what extent this is, he reports years ago being told he had has a heart attack, denies any coronary interventions      On statin, reports lipids are monitored and managed with his PMD      Chronic sounding atypical CP   6. PAFib/flutter     CHA2DS2Vasc is 4, on Xarelto     Burden is 9%          Disposition: F/u with Q 3 month remotes, see him back in 4 mo, sooner if needed.  Current medicines are reviewed at length with the patient today.  The patient did not have any concerns regarding medicines.  Venetia Night, PA-C 05/11/2017 10:19 AM     [facility] [place] [id number] [phone] (office)  [phone] (fax)

## 2017-05-11 ENCOUNTER — Encounter: Payer: Self-pay | Admitting: Physician Assistant

## 2017-05-11 ENCOUNTER — Ambulatory Visit: Payer: Medicare Other | Admitting: Physician Assistant

## 2017-05-11 VITALS — BP 112/68 | HR 64 | Ht 69.0 in | Wt 177.6 lb

## 2017-05-11 DIAGNOSIS — I5032 Chronic diastolic (congestive) heart failure: Secondary | ICD-10-CM | POA: Diagnosis not present

## 2017-05-11 DIAGNOSIS — I442 Atrioventricular block, complete: Secondary | ICD-10-CM

## 2017-05-11 DIAGNOSIS — I480 Paroxysmal atrial fibrillation: Secondary | ICD-10-CM

## 2017-05-11 DIAGNOSIS — I2510 Atherosclerotic heart disease of native coronary artery without angina pectoris: Secondary | ICD-10-CM | POA: Diagnosis not present

## 2017-05-11 DIAGNOSIS — Z950 Presence of cardiac pacemaker: Secondary | ICD-10-CM

## 2017-05-11 DIAGNOSIS — Z79899 Other long term (current) drug therapy: Secondary | ICD-10-CM

## 2017-05-11 LAB — BASIC METABOLIC PANEL
BUN/Creatinine Ratio: 19 (ref 10–24)
BUN: 23 mg/dL (ref 8–27)
CALCIUM: 9.3 mg/dL (ref 8.6–10.2)
CO2: 20 mmol/L (ref 20–29)
Chloride: 103 mmol/L (ref 96–106)
Creatinine, Ser: 1.23 mg/dL (ref 0.76–1.27)
GFR calc Af Amer: 62 mL/min/{1.73_m2} (ref >59)
GFR, EST NON AFRICAN AMERICAN: 54 mL/min/{1.73_m2} — AB (ref >59)
Glucose: 154 mg/dL — ABNORMAL HIGH (ref 65–99)
POTASSIUM: 4.3 mmol/L (ref 3.5–5.2)
Sodium: 141 mmol/L (ref 134–144)

## 2017-05-11 LAB — CBC
HEMATOCRIT: 42.7 % (ref 37.5–51.0)
Hemoglobin: 14.8 g/dL (ref 13.0–17.7)
MCH: 35.1 pg — ABNORMAL HIGH (ref 26.6–33.0)
MCHC: 34.7 g/dL (ref 31.5–35.7)
MCV: 101 fL — ABNORMAL HIGH (ref 79–97)
Platelets: 247 10*3/uL (ref 150–379)
RBC: 4.22 x10E6/uL (ref 4.14–5.80)
RDW: 13.8 % (ref 12.3–15.4)
WBC: 14.3 10*3/uL — AB (ref 3.4–10.8)

## 2017-05-11 MED ORDER — RIVAROXABAN 20 MG PO TABS: 20.0000 mg | ORAL_TABLET | Freq: Every day | ORAL | 5 refills | Status: DC

## 2017-05-11 MED ORDER — FUROSEMIDE 40 MG PO TABS
ORAL_TABLET | ORAL | Status: DC
Start: 2017-05-11 — End: 2017-06-14

## 2017-05-11 NOTE — Patient Instructions (Addendum)
Medication Instructions:   START TAKING LASIX 40 MG IN THE AM AND 20 MG  IN THE PM    If you need a refill on your cardiac medications before your next appointment, please call your pharmacy.  Labwork:  BMET AND CBC TODAY   RETURN FOR REPEAT BMET IN 10 DAYS    Testing/Procedures: NONE ORDERED  TODAY    Follow-Up:  IN 4 MONTHS WITH URSUY OR KLEIN     Remote monitoring is used to monitor your Pacemaker of ICD from home. This monitoring reduces the number of office visits required to check your device to one time per year. It allows [place] to keep an eye on the functioning of your device to ensure it is working properly. You are scheduled for a device check from home on .  08-10-17.Marland KitchenMarland KitchenYou may send your transmission at any time that day. If you have a wireless device, the transmission will be sent automatically. After your physician reviews your transmission, you will receive a postcard with your next transmission date.  Any Other Special Instructions Will Be Listed Below (If Applicable).

## 2017-05-19 ENCOUNTER — Telehealth: Payer: Self-pay | Admitting: *Deleted

## 2017-05-19 NOTE — Telephone Encounter (Signed)
LM ON CELL VM NO ANSWERING SERVICE SET UP OR NORMAL LAB RESULTS AND CLINIC NUMBER FOR CALL BACK IF ANY QUESTIONS.

## 2017-05-19 NOTE — Telephone Encounter (Signed)
-----   Message from [place], [place] sent at 05/12/2017  9:39 AM EDT ----- Blood counts and kidney function are stable for his Xarelto, no changes. His WBC is again elevated, though no reported signs/symptoms of infection.  He should follow up this with his PMD.  Please forward this and last CBC/labs to his primary doctor.  Thanks renee

## 2017-05-20 ENCOUNTER — Other Ambulatory Visit: Payer: Medicare Other | Admitting: *Deleted

## 2017-05-20 ENCOUNTER — Telehealth: Payer: Self-pay | Admitting: *Deleted

## 2017-05-20 DIAGNOSIS — Z79899 Other long term (current) drug therapy: Secondary | ICD-10-CM

## 2017-05-20 LAB — BASIC METABOLIC PANEL
BUN/Creatinine Ratio: 22 (ref 10–24)
BUN: 29 mg/dL — ABNORMAL HIGH (ref 8–27)
CO2: 24 mmol/L (ref 20–29)
CREATININE: 1.31 mg/dL — AB (ref 0.76–1.27)
Calcium: 9.3 mg/dL (ref 8.6–10.2)
Chloride: 101 mmol/L (ref 96–106)
GFR calc Af Amer: 58 mL/min/{1.73_m2} — ABNORMAL LOW (ref >59)
GFR, EST NON AFRICAN AMERICAN: 50 mL/min/{1.73_m2} — AB (ref >59)
Glucose: 137 mg/dL — ABNORMAL HIGH (ref 65–99)
Potassium: 3.8 mmol/L (ref 3.5–5.2)
SODIUM: 142 mmol/L (ref 134–144)

## 2017-05-20 NOTE — Telephone Encounter (Signed)
I have done a tier exception through covermymeds on the pts Xarelto to try to lower the cost. The pt is aware.

## 2017-05-20 NOTE — Telephone Encounter (Signed)
Patient was in the office for lab work and requested xarelto samples, "he states anything will help". Two weeks of samples provided. His copay has gone up to over $100.

## 2017-05-23 NOTE — Telephone Encounter (Signed)
Denial letter received from [employer] on the pts Xarelto tier exception. Reason: "We denied this request under Medicare Part D because: Xarelto does not qualify for a lower copay."  I have faxed denial letter to the pts pharmacy.

## 2017-05-23 NOTE — Telephone Encounter (Addendum)
**Note De-Identified Amarylis Rovito Obfuscation** We received a denial on the pts Xarelto tier exception Morton Simson fax from [place] as it was changed to a PA within the system.  I called [employer] and did a Xarelto tier exception with Maudie Mercury. Per Maudie Mercury we will receive a fax within a few days with their decision on this tier exception.

## 2017-05-27 ENCOUNTER — Telehealth: Payer: Self-pay | Admitting: *Deleted

## 2017-05-27 NOTE — Telephone Encounter (Signed)
SPOKE TO PT WIFE WHO SAYS THAT SHE HAS BEEN TRYING TO STAY ON PT TO CONSISTENTLY  TAKE 1/2 TABLET OF LASIX AS INSTRUCTED.  PER WIFE PT IS STUBBORN.  PT HAS NO COMPLIANTS OF C/P OR SOB  LEGS ARE SWOLLEN AND SWELLING HANGS OF ANKLES WHEN LEGS HANGS OFF BED.  PT WIFE STATES SHE WILL CALL us BACK FRDAY NEXT WEEK  WHEN HE HAS TAKEN 1/2 TABLET EACH DAY WITH HER FUSSING ABOUT.

## 2017-05-27 NOTE — Telephone Encounter (Signed)
-----   Message from [facility], [place] sent at 05/26/2017  5:49 PM EDT ----- Continue to try an encourage second dose.     Thanks renee

## 2017-06-14 ENCOUNTER — Other Ambulatory Visit: Payer: Self-pay | Admitting: *Deleted

## 2017-06-14 MED ORDER — FUROSEMIDE 40 MG PO TABS: ORAL_TABLET | ORAL | 11 refills | Status: DC

## 2017-08-10 ENCOUNTER — Telehealth: Payer: Self-pay | Admitting: Cardiology

## 2017-08-10 ENCOUNTER — Ambulatory Visit (INDEPENDENT_AMBULATORY_CARE_PROVIDER_SITE_OTHER): Payer: Medicare Other | Admitting: *Deleted

## 2017-08-10 DIAGNOSIS — I442 Atrioventricular block, complete: Secondary | ICD-10-CM

## 2017-08-10 NOTE — Telephone Encounter (Signed)
Spoke with pt and reminded pt of remote transmission that is due today. Pt verbalized understanding.   

## 2017-08-11 ENCOUNTER — Encounter: Payer: Self-pay | Admitting: Cardiology

## 2017-08-11 NOTE — Progress Notes (Signed)
Remote pacemaker transmission.   

## 2017-09-01 ENCOUNTER — Other Ambulatory Visit: Payer: Self-pay | Admitting: Internal Medicine

## 2017-09-06 LAB — IFOBT (OCCULT BLOOD): IMMUNOLOGICAL FECAL OCCULT BLOOD TEST: POSITIVE

## 2017-09-19 LAB — CUP PACEART REMOTE DEVICE CHECK
Battery Remaining Longevity: 73 mo
Battery Remaining Percentage: 57 %
Battery Voltage: 2.89 V
Brady Statistic AP VS Percent: 1 %
Brady Statistic AS VP Percent: 59 %
Brady Statistic AS VS Percent: 1 %
Brady Statistic RV Percent Paced: 99 %
Implantable Lead Implant Date: 20130502
Implantable Lead Implant Date: 20130502
Implantable Lead Location: 753859
Implantable Lead Location: 753860
Lead Channel Impedance Value: 380 Ohm
Lead Channel Impedance Value: 440 Ohm
Lead Channel Pacing Threshold Amplitude: 0.5 V
Lead Channel Pacing Threshold Amplitude: 0.75 V
Lead Channel Pacing Threshold Pulse Width: 0.5 ms
Lead Channel Sensing Intrinsic Amplitude: 12 mV
Lead Channel Setting Pacing Amplitude: 0.75 V
Lead Channel Setting Pacing Amplitude: 2 V
Lead Channel Setting Pacing Pulse Width: 0.5 ms
MDC IDC MSMT LEADCHNL RA PACING THRESHOLD PULSEWIDTH: 0.5 ms
MDC IDC MSMT LEADCHNL RA SENSING INTR AMPL: 4.9 mV
MDC IDC PG IMPLANT DT: 20130502
MDC IDC SESS DTM: 20190724200349
MDC IDC SET LEADCHNL RV SENSING SENSITIVITY: 4 mV
MDC IDC STAT BRADY AP VP PERCENT: 40 %
MDC IDC STAT BRADY RA PERCENT PACED: 23 %
Pulse Gen Model: 2110
Pulse Gen Serial Number: 7321242

## 2017-09-27 ENCOUNTER — Ambulatory Visit: Payer: Medicare Other | Admitting: Internal Medicine

## 2017-09-27 ENCOUNTER — Encounter: Payer: Self-pay | Admitting: Internal Medicine

## 2017-09-27 VITALS — BP 122/62 | HR 70 | Ht 69.0 in | Wt 180.0 lb

## 2017-09-27 DIAGNOSIS — I5032 Chronic diastolic (congestive) heart failure: Secondary | ICD-10-CM | POA: Diagnosis not present

## 2017-09-27 DIAGNOSIS — I442 Atrioventricular block, complete: Secondary | ICD-10-CM | POA: Diagnosis not present

## 2017-09-27 DIAGNOSIS — I2510 Atherosclerotic heart disease of native coronary artery without angina pectoris: Secondary | ICD-10-CM

## 2017-09-27 DIAGNOSIS — Z950 Presence of cardiac pacemaker: Secondary | ICD-10-CM | POA: Diagnosis not present

## 2017-09-27 DIAGNOSIS — I480 Paroxysmal atrial fibrillation: Secondary | ICD-10-CM

## 2017-09-27 LAB — CUP PACEART INCLINIC DEVICE CHECK
Battery Remaining Longevity: 74 mo
Brady Statistic RA Percent Paced: 24 %
Brady Statistic RV Percent Paced: 99 %
Date Time Interrogation Session: 20190910151207
Implantable Lead Implant Date: 20130502
Implantable Lead Location: 753859
Lead Channel Impedance Value: 362.5 Ohm
Lead Channel Impedance Value: 462.5 Ohm
Lead Channel Pacing Threshold Pulse Width: 0.5 ms
Lead Channel Sensing Intrinsic Amplitude: 12 mV
Lead Channel Sensing Intrinsic Amplitude: 5 mV
Lead Channel Setting Pacing Amplitude: 0.875
Lead Channel Setting Pacing Pulse Width: 0.5 ms
MDC IDC LEAD IMPLANT DT: 20130502
MDC IDC LEAD LOCATION: 753860
MDC IDC MSMT BATTERY VOLTAGE: 2.89 V
MDC IDC MSMT LEADCHNL RV PACING THRESHOLD AMPLITUDE: 0.5 V
MDC IDC MSMT LEADCHNL RV PACING THRESHOLD AMPLITUDE: 0.5 V
MDC IDC MSMT LEADCHNL RV PACING THRESHOLD PULSEWIDTH: 0.5 ms
MDC IDC PG IMPLANT DT: 20130502
MDC IDC PG SERIAL: 7321242
MDC IDC SET LEADCHNL RA PACING AMPLITUDE: 2 V
MDC IDC SET LEADCHNL RV SENSING SENSITIVITY: 4 mV

## 2017-09-27 NOTE — Progress Notes (Signed)
Patient Care Team: Burnard Bunting, MD as PCP - General (Internal Medicine) Ladene Artist, MD as Consulting Physician (Gastroenterology) Deboraha Sprang, MD as Consulting Physician (Cardiology)   HPI  Lance Smith is a 84 y.o. male Seen following pacemaker implantation May 2013 for complete heart block. Echocardiogram at that time demonstrated an ejection fraction 55-60%  no syncope Still smoking      DATE TEST    5/13    Echo   EF 55-65 %   6/15 Myoview   EF 51 % fixed, medium-sized moderate basal inferior and inferoseptal perfusion defect and a fixed small, mild mid inferolateral perfusion defect.           He also has history of recurrent lower extremity DVT; he says this is #3; he is   on chronic anticoagulation with Rivaroxaban Date Cr Hgb  6/15 1.36 14.1  5/19  1.31 14.8    Increasing fatigue.  Stable dyspnea.  No chest pain.  No edema.  He is caring for his wife who is struggling with memory and seizures following brain surgery about 5 or 6 years ago and his son who is handicapped.  He is her main caregiver.     Past Medical History:  Diagnosis Date  . Adenomatous colon polyp 01/2002  . Arthritis   . CKD (chronic kidney disease) stage 3, GFR 30-59 ml/min (HCC)   . Complete heart block ([place])    a. 05/2011 s/p SJM Model [id number] Dual Chamber PPM  . COPD (chronic obstructive pulmonary disease) ([place])   . Diabetes mellitus ([place])   . Diastolic CHF ([place])    a. 07/2092 Echo: EF 55-60%  . DVT (deep venous thrombosis), unspecified laterality    a. previously on coumadin - d/c'd spring of 2013  . Hyperglycemia   . Hyperlipidemia   . Hypertension   . Inferior myocardial infarction ([place]) 08/22/2013  . Steroid dependence   . Tobacco abuse     Past Surgical History:  Procedure Laterality Date  . bilateral inguinal hernia    . MELANOMA EXCISION    . PACEMAKER INSERTION  May 2013  . PERMANENT PACEMAKER INSERTION Left 05/20/2011   Procedure: PERMANENT PACEMAKER  INSERTION;  Surgeon: Deboraha Sprang, MD;  Location[facility] C[facility];  Service: Cardiovascular;  Laterality: Left;    Current Outpatient Medications  Medication Sig Dispense Refill  . furosemide (LASIX) 40 MG tablet Take 1 tablet (40 mg) by mouth in the AM and take an 1/2  tablet (20 mg) by mouth in the PM 45 tablet 11  . metFORMIN (GLUCOPHAGE) 500 MG tablet Take 500 mg by mouth daily.    . mometasone-formoterol (DULERA) 200-5 MCG/ACT AERO Inhale 2 puffs into the lungs 2 (two) times daily as needed for wheezing or shortness of breath.     . Multiple Vitamins-Minerals (CENTRUM SILVER ADULT 50+ PO) Take 1 tablet by mouth daily.     . nitroGLYCERIN (NITROSTAT) 0.4 MG SL tablet Place 0.4 mg under the tongue every 5 (five) minutes as needed. For chest pain    . predniSONE (DELTASONE) 10 MG tablet Take 10 mg by mouth every morning.     Marland Kitchen RAPAFLO 8 MG CAPS capsule Take 8 mg by mouth daily with breakfast.     . rivaroxaban (XARELTO) 20 MG TABS tablet Take 1 tablet (20 mg total) by mouth daily. 30 tablet 5  . simvastatin (ZOCOR) 40 MG tablet Take 40 mg by mouth every morning.     Marland Kitchen  Acetaminophen 500 MG coapsule Take 2 capsules by mouth every 6 (six) hours as needed (pain).      No current facility-administered medications for this visit.     Allergies  Allergen Reactions  . Tetanus Toxoids Shortness Of Breath and Swelling  . Aspirin Nausea Only and Other (See Comments)    headache  . Penicillins Swelling    Review of Systems negative except from HPI and PMH  Physical Exam BP 122/62   Pulse 70   Ht 5\' 9"  (1.753 m)   Wt 180 lb (81.6 kg)   BMI 26.58 kg/m  Well developed and nourished in no acute distress HENT normal Neck supple with JVP-flat Clear Regular rate and rhythm, no murmurs or gallops Abd-soft with active BS No Clubbing cyanosis edema Skin-warm and dry A & Oriented  Grossly normal sensory and motor function  ECG demonstrates atrial fibrillation/flutter with underlying  ventricular pacing  Assessment and  Plan  Complete heart block  CAD  Prior MI  Pacemaker-St. Jude  DVT-recurrent  Daytime somnolence/obstructive nocturnal breathing  DOE  Atrial fibrillation/flutter-persistent   GFR is borderline at 48.85.  We will leave him on his current dose as a 1.31 is a little bit higher and has been in the past.  We will plan to recheck in 6 months.  It may be indicated at that time to decrease his Xarelto dose.  Without symptoms of ischemia  Euvolemic continue current meds  Intercurrent atrial arrhythmia at about 30%.  No clear attributable symptoms.  Heart rate during mode switch is mostly ventricularly paced and controlled  He takes care of his handicapped son as well as his wife who had a brain tumor 5 or 6 years ago and has subsequent seizures.  It is extremely burdensome.  Recommended palliative care  We spent more than 50% of our >25 min visit in face to face counseling regarding the above

## 2017-09-27 NOTE — Patient Instructions (Addendum)
Medication Instructions:  Your physician recommends that you continue on your current medications as directed. Please refer to the Current Medication list given to you today.  Labwork: None ordered.  Testing/Procedures: None ordered.  Follow-Up:  6 months with Tommye Standard, PA  Your physician wants you to follow-up in: One Year with Dr Caryl Comes. You will receive a reminder letter in the mail two months in advance. If you don't receive a letter, please call our office to schedule the follow-up appointment.  Remote monitoring is used to monitor your Pacemaker from home. This monitoring reduces the number of office visits required to check your device to one time per year. It allows [place] to keep an eye on the functioning of your device to ensure it is working properly. You are scheduled for a device check from home on 10/23. You may send your transmission at any time that day. If you have a wireless device, the transmission will be sent automatically. After your physician reviews your transmission, you will receive a postcard with your next transmission date.    Any Other Special Instructions Will Be Listed Below (If Applicable).     If you need a refill on your cardiac medications before your next appointment, please call your pharmacy.

## 2017-09-29 ENCOUNTER — Ambulatory Visit: Payer: Medicare Other | Admitting: Physician Assistant

## 2017-09-29 ENCOUNTER — Encounter: Payer: Self-pay | Admitting: Physician Assistant

## 2017-09-29 VITALS — BP 132/60 | HR 72 | Ht 69.0 in | Wt 179.0 lb

## 2017-09-29 DIAGNOSIS — R195 Other fecal abnormalities: Secondary | ICD-10-CM

## 2017-09-29 NOTE — Progress Notes (Signed)
Chief Complaint: Positive Hemosure test  HPI:    Mr. Lance Smith is an 84 year old male, known to Dr. Fuller Plan, with a complicated past medical history including complete heart block status post pacemaker placement, CKD stage III, COPD, diabetes, diastolic CHF (0/26/3785 echo LVEF 55-60%) and history of DVT on Xarelto, who was referred to me by Burnard Bunting, MD for a complaint of positive Hemosure test.      07/31/2014 office visit Dr. Fuller Plan to discuss positive iFOB.  At that time it was discussed patient had underwent colonoscopy July 2015 which showed small adenomatous colon polyps, diverticulosis and moderate-sized internal hemorrhoids.  At that time it was discussed that patient had recently had a colonoscopy and it was suspected his hemorrhoids were the source of fecal occult blood.  Given his age, recent colonoscopy and co morbidities, it was recommended he have no future surveillance colonoscopies and it was also recommended no future screening testing for fecal occult blood.    09/07/2017+ Hemosure    Today, patient and his wife present to clinic and explain that he has been doing well.  Patient has not noticed any blood in his stool or black tarry sticky stools. Patient did have a recent CBC with a normal hemoglobin around 14.  He does bring his labs with him.  Tells me he has some chronic bilateral lower abdominal pain but this is not changed over the past few years, which comes on before a bowel movement and is relieved afterwards.  Denies any other changes.     Denies fever, chills, change in bowel habits, weight loss, anorexia, nausea, vomiting or symptoms that awaken him from sleep.  Past Medical History:  Diagnosis Date  . Adenomatous colon polyp 01/2002  . Arthritis   . CKD (chronic kidney disease) stage 3, GFR 30-59 ml/min (HCC)   . Complete heart block ([place])    a. 05/2011 s/p SJM Model [id number] Dual Chamber PPM  . COPD (chronic obstructive pulmonary disease) ([place])   . Diabetes mellitus  ([place])   . Diastolic CHF ([place])    a. 04/1285 Echo: EF 55-60%  . DVT (deep venous thrombosis), unspecified laterality    a. previously on coumadin - d/c'd spring of 2013  . Hyperglycemia   . Hyperlipidemia   . Hypertension   . Inferior myocardial infarction ([place]) 08/22/2013  . Steroid dependence   . Tobacco abuse     Past Surgical History:  Procedure Laterality Date  . bilateral inguinal hernia    . MELANOMA EXCISION    . PACEMAKER INSERTION  May 2013  . PERMANENT PACEMAKER INSERTION Left 05/20/2011   Procedure: PERMANENT PACEMAKER INSERTION;  Surgeon: Deboraha Sprang, MD;  Location[facility] CATH LAB;  Service: Cardiovascular;  Laterality: Left;    Current Outpatient Medications  Medication Sig Dispense Refill  . Acetaminophen 500 MG coapsule Take 2 capsules by mouth every 6 (six) hours as needed (pain).     . furosemide (LASIX) 40 MG tablet Take 1 tablet (40 mg) by mouth in the AM and take an 1/2  tablet (20 mg) by mouth in the PM 45 tablet 11  . metFORMIN (GLUCOPHAGE) 500 MG tablet Take 500 mg by mouth daily.    . mometasone-formoterol (DULERA) 200-5 MCG/ACT AERO Inhale 2 puffs into the lungs 2 (two) times daily as needed for wheezing or shortness of breath.     . Multiple Vitamins-Minerals (CENTRUM SILVER ADULT 50+ PO) Take 1 tablet by mouth daily.     . nitroGLYCERIN (NITROSTAT) 0.4  MG SL tablet Place 0.4 mg under the tongue every 5 (five) minutes as needed. For chest pain    . predniSONE (DELTASONE) 10 MG tablet Take 10 mg by mouth every morning.     Marland Kitchen RAPAFLO 8 MG CAPS capsule Take 8 mg by mouth daily with breakfast.     . rivaroxaban (XARELTO) 20 MG TABS tablet Take 1 tablet (20 mg total) by mouth daily. 30 tablet 5  . simvastatin (ZOCOR) 40 MG tablet Take 40 mg by mouth every morning.      No current facility-administered medications for this visit.     Allergies as of 09/29/2017 - Review Complete 09/29/2017  Allergen Reaction Noted  . Tetanus toxoids Shortness Of Breath and  Swelling 08/06/2010  . Aspirin Nausea Only and Other (See Comments) 08/06/2010  . Penicillins Swelling 08/06/2010    Family History  Problem Relation Age of Onset  . Leukemia Mother   . Colon cancer Neg Hx     Social History   Socioeconomic History  . Marital status: Married    Spouse name: Not on file  . Number of children: Not on file  . Years of education: Not on file  . Highest education level: Not on file  Occupational History  . Occupation: retired  Scientific laboratory technician  . Financial resource strain: Not on file  . Food insecurity:    Worry: Not on file    Inability: Not on file  . Transportation needs:    Medical: Not on file    Non-medical: Not on file  Tobacco Use  . Smoking status: Current Every Day Smoker    Packs/day: 2.00    Types: Cigarettes  . Smokeless tobacco: Never Used  Substance and Sexual Activity  . Alcohol use: No    Alcohol/week: 0.0 standard drinks  . Drug use: No  . Sexual activity: Not Currently  Lifestyle  . Physical activity:    Days per week: Not on file    Minutes per session: Not on file  . Stress: Not on file  Relationships  . Social connections:    Talks on phone: Not on file    Gets together: Not on file    Attends religious service: Not on file    Active member of club or organization: Not on file    Attends meetings of clubs or organizations: Not on file    Relationship status: Not on file  . Intimate partner violence:    Fear of current or ex partner: Not on file    Emotionally abused: Not on file    Physically abused: Not on file    Forced sexual activity: Not on file  Other Topics Concern  . Not on file  Social History Narrative  . Not on file    Review of Systems:    Constitutional: No weight loss, fever or chills Skin: No rash Cardiovascular: No chest pain, chest pressure or palpitations   Respiratory: No SOB Gastrointestinal: See HPI and otherwise negative Genitourinary: No dysuria  Neurological: No headache,  dizziness or syncope Musculoskeletal: No new muscle or joint pain Hematologic: No bleeding  Psychiatric: No history of depression or anxiety   Physical Exam:  Vital signs: BP 132/60   Pulse 72   Ht 5\' 9"  (1.753 m)   Wt 179 lb (81.2 kg)   BMI 26.43 kg/m   Constitutional:   Pleasant Chronically ill appearing Caucasian male appears to be in NAD, Well developed, Well nourished, alert and cooperative Head:  Normocephalic  and atraumatic. Eyes:   PEERL, EOMI. No icterus. Conjunctiva pink. Ears:  Normal auditory acuity. Neck:  Supple Throat: Oral cavity and pharynx without inflammation, swelling or lesion.  Respiratory: Respirations even and unlabored. Lungs clear to auscultation bilaterally.   No wheezes, crackles, or rhonchi.  Cardiovascular: Normal S1, S2. No MRG. Regular rate and rhythm. No peripheral edema, cyanosis or pallor.  Gastrointestinal:  Soft, nondistended, nontender. No rebound or guarding. Normal bowel sounds. No appreciable masses or hepatomegaly. Rectal:  Not performed.  Msk:  Symmetrical without gross deformities. Without edema, no deformity or joint abnormality.  Neurologic:  Alert and  oriented x4;  grossly normal neurologically.  Skin:   Dry and intact without significant lesions or rashes. Psychiatric: Demonstrates good judgement and reason without abnormal affect or behaviors.  RELEVANT LABS AND IMAGING: CBC    Component Value Date/Time   WBC 14.3 (H) 05/11/2017 1112   WBC 9.3 07/13/2013 1148   RBC 4.22 05/11/2017 1112   RBC 4.37 07/13/2013 1148   HGB 14.8 05/11/2017 1112   HCT 42.7 05/11/2017 1112   PLT 247 05/11/2017 1112   MCV 101 (H) 05/11/2017 1112   MCH 35.1 (H) 05/11/2017 1112   MCH 32.3 07/13/2013 1148   MCHC 34.7 05/11/2017 1112   MCHC 34.1 07/13/2013 1148   RDW 13.8 05/11/2017 1112   LYMPHSABS 1.7 09/29/2016 1021   MONOABS 0.7 06/29/2011 1611   EOSABS 0.5 (H) 09/29/2016 1021   BASOSABS 0.1 09/29/2016 1021    CMP     Component Value  Date/Time   NA 142 05/20/2017 1019   K 3.8 05/20/2017 1019   CL 101 05/20/2017 1019   CO2 24 05/20/2017 1019   GLUCOSE 137 (H) 05/20/2017 1019   GLUCOSE 137 (H) 07/13/2013 1148   BUN 29 (H) 05/20/2017 1019   CREATININE 1.31 (H) 05/20/2017 1019   CALCIUM 9.3 05/20/2017 1019   PROT 6.5 07/13/2013 1148   ALBUMIN 3.0 (L) 07/13/2013 1148   AST 26 07/13/2013 1148   ALT 20 07/13/2013 1148   ALKPHOS 73 07/13/2013 1148   BILITOT 0.5 07/13/2013 1148   GFRNONAA 50 (L) 05/20/2017 1019   GFRAA 58 (L) 05/20/2017 1019    Assessment: 1.   Positive Hemosure test: Patient is not experiencing any bright red blood or black tarry stools, no change in bowel habits, no weight loss, no fever, no chills, no alarm symptoms; likely hemorrhoids  Plan: 1.  Discussed with patient that it has been now 4 years since his last colonoscopy which did reveal 2 small adenomatous polyps.  Patient did see Dr. Fuller Plan 3 years ago for + IFOB test and at that time it was discussed that he should have no further colonoscopies given his comorbidities and age.  Patient is experiencing no further alarm symptoms, he has not seen blood in his stool, had a change in bowel habits, change in abdominal pain, weight loss, fever or other symptoms.  Did discuss with the patient and his wife that if he does experience any of these he needs to let us know. 2.  Patient will follow in clinic with [place] as needed in the future.  I will discuss with Dr. Fuller Plan to ensure that he does not have different recommendations.  Ellouise Newer, PA-C [facility] 09/29/2017, 12:24 PM  Cc: Burnard Bunting, MD

## 2017-09-30 NOTE — Progress Notes (Signed)
Reviewed and agree with initial management plan.  Shanyia Stines T. Allante Whitmire, MD FACG 

## 2017-10-14 ENCOUNTER — Other Ambulatory Visit: Payer: Self-pay | Admitting: Internal Medicine

## 2017-10-14 DIAGNOSIS — Z720 Tobacco use: Secondary | ICD-10-CM

## 2017-10-14 DIAGNOSIS — J449 Chronic obstructive pulmonary disease, unspecified: Secondary | ICD-10-CM

## 2017-10-24 ENCOUNTER — Ambulatory Visit
Admission: RE | Admit: 2017-10-24 | Discharge: 2017-10-24 | Disposition: A | Discharge status: Home / Self Care | Payer: Medicare Other | Source: Ambulatory Visit | Attending: Internal Medicine | Admitting: Internal Medicine

## 2017-10-24 DIAGNOSIS — J449 Chronic obstructive pulmonary disease, unspecified: Secondary | ICD-10-CM

## 2017-10-24 DIAGNOSIS — Z720 Tobacco use: Secondary | ICD-10-CM

## 2017-10-26 ENCOUNTER — Other Ambulatory Visit: Payer: Self-pay | Admitting: Internal Medicine

## 2017-10-26 DIAGNOSIS — Z720 Tobacco use: Secondary | ICD-10-CM

## 2017-11-09 ENCOUNTER — Ambulatory Visit (INDEPENDENT_AMBULATORY_CARE_PROVIDER_SITE_OTHER): Payer: Medicare Other | Admitting: *Deleted

## 2017-11-09 DIAGNOSIS — I442 Atrioventricular block, complete: Secondary | ICD-10-CM

## 2017-11-09 NOTE — Progress Notes (Signed)
Remote pacemaker transmission.   

## 2017-12-19 ENCOUNTER — Ambulatory Visit
Admission: RE | Admit: 2017-12-19 | Discharge: 2017-12-19 | Disposition: A | Discharge status: Home / Self Care | Payer: Medicare Other | Source: Ambulatory Visit | Attending: Internal Medicine | Admitting: Internal Medicine

## 2017-12-19 DIAGNOSIS — Z720 Tobacco use: Secondary | ICD-10-CM

## 2018-01-14 LAB — CUP PACEART REMOTE DEVICE CHECK
Battery Remaining Longevity: 74 mo
Battery Remaining Percentage: 57 %
Battery Voltage: 2.89 V
Brady Statistic AP VP Percent: 39 %
Brady Statistic AP VS Percent: 1 %
Brady Statistic AS VP Percent: 60 %
Brady Statistic AS VS Percent: 1 %
Brady Statistic RA Percent Paced: 21 %
Brady Statistic RV Percent Paced: 99 %
Date Time Interrogation Session: 20191023143318
Implantable Lead Implant Date: 20130502
Implantable Lead Implant Date: 20130502
Implantable Lead Location: 753859
Implantable Lead Location: 753860
Implantable Pulse Generator Implant Date: 20130502
Lead Channel Impedance Value: 400 Ohm
Lead Channel Impedance Value: 460 Ohm
Lead Channel Pacing Threshold Amplitude: 0.625 V
Lead Channel Pacing Threshold Amplitude: 0.75 V
Lead Channel Pacing Threshold Pulse Width: 0.5 ms
Lead Channel Pacing Threshold Pulse Width: 0.5 ms
Lead Channel Sensing Intrinsic Amplitude: 12 mV
Lead Channel Sensing Intrinsic Amplitude: 5 mV
Lead Channel Setting Pacing Amplitude: 0.875
Lead Channel Setting Pacing Amplitude: 2 V
Lead Channel Setting Pacing Pulse Width: 0.5 ms
Lead Channel Setting Sensing Sensitivity: 4 mV
Pulse Gen Model: 2110
Pulse Gen Serial Number: 7321242

## 2018-02-08 ENCOUNTER — Ambulatory Visit (INDEPENDENT_AMBULATORY_CARE_PROVIDER_SITE_OTHER): Payer: Medicare Other

## 2018-02-08 DIAGNOSIS — I442 Atrioventricular block, complete: Secondary | ICD-10-CM

## 2018-02-08 DIAGNOSIS — I5032 Chronic diastolic (congestive) heart failure: Secondary | ICD-10-CM

## 2018-02-09 NOTE — Progress Notes (Signed)
Remote pacemaker transmission.   

## 2018-02-10 ENCOUNTER — Encounter: Payer: Self-pay | Admitting: Cardiology

## 2018-02-12 LAB — CUP PACEART REMOTE DEVICE CHECK
Battery Remaining Longevity: 68 mo
Battery Remaining Percentage: 51 %
Battery Voltage: 2.87 V
Brady Statistic AP VP Percent: 40 %
Brady Statistic AP VS Percent: 1 %
Brady Statistic AS VS Percent: 1 %
Brady Statistic RA Percent Paced: 9.9 %
Brady Statistic RV Percent Paced: 99 %
Date Time Interrogation Session: 20200122154645
Implantable Lead Implant Date: 20130502
Implantable Lead Implant Date: 20130502
Implantable Lead Location: 753859
Implantable Lead Location: 753860
Implantable Pulse Generator Implant Date: 20130502
Lead Channel Impedance Value: 400 Ohm
Lead Channel Impedance Value: 480 Ohm
Lead Channel Pacing Threshold Amplitude: 0.625 V
Lead Channel Pacing Threshold Amplitude: 0.75 V
Lead Channel Pacing Threshold Pulse Width: 0.5 ms
Lead Channel Pacing Threshold Pulse Width: 0.5 ms
Lead Channel Sensing Intrinsic Amplitude: 12 mV
Lead Channel Sensing Intrinsic Amplitude: 4.4 mV
Lead Channel Setting Pacing Amplitude: 0.875
Lead Channel Setting Pacing Amplitude: 2 V
MDC IDC SET LEADCHNL RV PACING PULSEWIDTH: 0.5 ms
MDC IDC SET LEADCHNL RV SENSING SENSITIVITY: 4 mV
MDC IDC STAT BRADY AS VP PERCENT: 59 %
Pulse Gen Model: 2110
Pulse Gen Serial Number: 7321242

## 2018-04-04 ENCOUNTER — Encounter: Payer: Medicare Other | Admitting: Physician Assistant

## 2018-04-06 ENCOUNTER — Telehealth: Payer: Self-pay

## 2018-04-06 NOTE — Telephone Encounter (Signed)
Called pt to assess his needs. His wife states he is ok except for ankle swelling. She states he fell in a hole yesterday and now one of his ankles is "the size of a football." His other ankle is also swollen due to fluid.  I advised Ms Bott to call his PCP for recommendation on the injured ankle. He may need an XRAY to determine between a break and sprain. She states she wasn't sure what to do. I offered to call Dr Jacquiline Doe office but she states she will call asap. I advised her to try and get an outpatient XRAY performed to lessen their time outside the home during COVID-19 outbreak.  As far as edema due to swelling, I advised pt's wife to take a full 40mg  bid (vs 40mg  in AM and 20mg  in the PM) for the next 2 days. She will call back the office on Monday if his edema does not subside.  She agrees to postpone his upcoming appt. However, she understands we are seeing pt on an acute basis. If pt needs to be seen sooner, she may call the office for recommendation or to set up a visit. She states he does not need any additional refills at this time.

## 2018-04-13 ENCOUNTER — Encounter: Payer: Medicare Other | Admitting: Internal Medicine

## 2018-05-10 ENCOUNTER — Ambulatory Visit (INDEPENDENT_AMBULATORY_CARE_PROVIDER_SITE_OTHER): Payer: Medicare Other | Admitting: *Deleted

## 2018-05-10 ENCOUNTER — Other Ambulatory Visit: Payer: Self-pay

## 2018-05-10 DIAGNOSIS — I442 Atrioventricular block, complete: Secondary | ICD-10-CM

## 2018-05-10 DIAGNOSIS — I5032 Chronic diastolic (congestive) heart failure: Secondary | ICD-10-CM

## 2018-05-10 LAB — CUP PACEART REMOTE DEVICE CHECK
Battery Remaining Longevity: 61 mo
Battery Remaining Percentage: 46 %
Battery Voltage: 2.86 V
Brady Statistic AP VP Percent: 40 %
Brady Statistic AP VS Percent: 1 %
Brady Statistic AS VP Percent: 59 %
Brady Statistic AS VS Percent: 1 %
Brady Statistic RA Percent Paced: 5.9 %
Brady Statistic RV Percent Paced: 99 %
Date Time Interrogation Session: 20200422122505
Implantable Lead Implant Date: 20130502
Implantable Lead Implant Date: 20130502
Implantable Lead Location: 753859
Implantable Lead Location: 753860
Implantable Pulse Generator Implant Date: 20130502
Lead Channel Impedance Value: 380 Ohm
Lead Channel Impedance Value: 460 Ohm
Lead Channel Pacing Threshold Amplitude: 0.5 V
Lead Channel Pacing Threshold Amplitude: 0.75 V
Lead Channel Pacing Threshold Pulse Width: 0.5 ms
Lead Channel Pacing Threshold Pulse Width: 0.5 ms
Lead Channel Sensing Intrinsic Amplitude: 12 mV
Lead Channel Sensing Intrinsic Amplitude: 4.4 mV
Lead Channel Setting Pacing Amplitude: 0.75 V
Lead Channel Setting Pacing Amplitude: 2 V
Lead Channel Setting Pacing Pulse Width: 0.5 ms
Lead Channel Setting Sensing Sensitivity: 4 mV
Pulse Gen Model: 2110
Pulse Gen Serial Number: 7321242

## 2018-05-18 NOTE — Progress Notes (Signed)
Remote pacemaker transmission.   

## 2018-05-19 ENCOUNTER — Other Ambulatory Visit: Payer: Self-pay

## 2018-05-19 ENCOUNTER — Telehealth (INDEPENDENT_AMBULATORY_CARE_PROVIDER_SITE_OTHER): Payer: Medicare Other | Admitting: Internal Medicine

## 2018-05-19 VITALS — BP 133/71 | HR 70 | Ht 69.0 in | Wt 175.2 lb

## 2018-05-19 DIAGNOSIS — I442 Atrioventricular block, complete: Secondary | ICD-10-CM | POA: Diagnosis not present

## 2018-05-19 DIAGNOSIS — Z950 Presence of cardiac pacemaker: Secondary | ICD-10-CM

## 2018-05-19 DIAGNOSIS — I480 Paroxysmal atrial fibrillation: Secondary | ICD-10-CM

## 2018-05-19 MED ORDER — AMIODARONE HCL 200 MG PO TABS: ORAL_TABLET | ORAL | 3 refills | Status: DC

## 2018-05-19 NOTE — Progress Notes (Signed)
Electrophysiology TeleHealth Note   Due to national recommendations of social distancing due to COVID 19, an audio/video telehealth visit is felt to be most appropriate for this patient at this time.  See MyChart message from today for the patient's consent to telehealth for [facility].   Date:  05/19/2018   ID:  Lance Smith, DOB April 27, 1931, MRN [id number]  Location: patient's home  Provider location: [address], [place]  Evaluation Performed: Follow-up visit  PCP:  Burnard Bunting, MD  Cardiologist:     Electrophysiologist:  SK   Chief Complaint:  chf   History of Present Illness:    Lance Smith is a 85 y.o. male who presents via audio/video conferencing for a telehealth visit today. The patient did not have access to video technology/had technical difficulties with video requiring transitioning to audio format only (telephone).  All issues noted in this document were discussed and addressed.  No physical exam could be performed with this format.        Since last being seen in our clinic for complete heart block in the setting of hypertrophic cardiomyopathy and persistent atrial fibrillation, the patient reports *progressive shortness of breath, peripheral edema and fatigue.  He continues to smoke.  Device interrogation demonstrates persistent atrial fibrillation since 11/19.  His edema is asymmetric left greater than right associated with DVT  Diet is salt deplete; fluid replete   DATE TEST    5/13    Echo   EF 55-65 %   6/15 Myoview   EF 51 % fixed, medium-sized moderate basal inferior and inferoseptal perfusion defect and a fixed small, mild mid inferolateral perfusion defect.  9/18 Echo  55-60% Asymmetric septal fibrillation      He also has history of recurrent lower extremity DVT; Zollie Pee here.  Are you working  on chronic anticoagulation with Rivaroxaban Date Cr Hgb  6/15 1.36 14.1  5/19  1.31 14.8  8/19 1.2 14.2      The patient denies symptoms of fevers, chills, cough, or new SOB worrisome for COVID 19.    Past Medical History:  Diagnosis Date  . Adenomatous colon polyp 01/2002  . Arthritis   . CKD (chronic kidney disease) stage 3, GFR 30-59 ml/min (HCC)   . Complete heart block ([place])    a. 05/2011 s/p SJM Model [id number] Dual Chamber PPM  . COPD (chronic obstructive pulmonary disease) ([place])   . Diabetes mellitus ([place])   . Diastolic CHF ([place])    a. 01/3084 Echo: EF 55-60%  . DVT (deep venous thrombosis), unspecified laterality    a. previously on coumadin - d/c'd spring of 2013  . Hyperglycemia   . Hyperlipidemia   . Hypertension   . Inferior myocardial infarction ([place]) 08/22/2013  . Steroid dependence   . Tobacco abuse     Past Surgical History:  Procedure Laterality Date  . bilateral inguinal hernia    . MELANOMA EXCISION    . PACEMAKER INSERTION  May 2013  . PERMANENT PACEMAKER INSERTION Left 05/20/2011   Procedure: PERMANENT PACEMAKER INSERTION;  Surgeon: Deboraha Sprang, MD;  Location: [facility] CATH LAB;  Service: Cardiovascular;  Laterality: Left;    Current Outpatient Medications  Medication Sig Dispense Refill  . Acetaminophen 500 MG coapsule Take 2 capsules by mouth every 6 (six) hours as needed (pain).     . furosemide (LASIX) 40 MG tablet Take 1 tablet (40 mg) by mouth in the AM and take an  1/2  tablet (20 mg) by mouth in the PM 45 tablet 11  . metFORMIN (GLUCOPHAGE) 500 MG tablet Take 500 mg by mouth daily.    . mometasone-formoterol (DULERA) 200-5 MCG/ACT AERO Inhale 2 puffs into the lungs 2 (two) times daily as needed for wheezing or shortness of breath.     . Multiple Vitamins-Minerals (CENTRUM SILVER ADULT 50+ PO) Take 1 tablet by mouth daily.     . nitroGLYCERIN (NITROSTAT) 0.4 MG SL tablet Place 0.4 mg under the tongue every 5 (five) minutes as needed. For chest pain    . predniSONE (DELTASONE) 10 MG tablet Take 10 mg by mouth every morning.     Marland Kitchen RAPAFLO 8 MG CAPS capsule Take 8 mg  by mouth daily with breakfast.     . rivaroxaban (XARELTO) 20 MG TABS tablet Take 1 tablet (20 mg total) by mouth daily. 30 tablet 5  . simvastatin (ZOCOR) 40 MG tablet Take 40 mg by mouth every morning.      No current facility-administered medications for this visit.     Allergies:   Tetanus toxoids; Aspirin; and Penicillins   Social History:  The patient  reports that he has been smoking cigarettes. He has been smoking about 2.00 packs per day. He has never used smokeless tobacco. He reports that he does not drink alcohol or use drugs.   Family History:  The patient's   family history includes Leukemia in his mother.   ROS:  Please see the history of present illness.   All other systems are personally reviewed and negative.    Exam:    Vital Signs:  BP 133/71   Pulse 70   Ht 5\' 9"  (1.753 m)   Wt 175 lb 3.2 oz (79.5 kg)   BMI 25.87 kg/m      Labs/Other Tests and Data Reviewed:    Recent Labs: 05/20/2017: BUN 29; Creatinine, Ser 1.31; Potassium 3.8; Sodium 142   Wt Readings from Last 3 Encounters:  05/19/18 175 lb 3.2 oz (79.5 kg)  09/29/17 179 lb (81.2 kg)  09/27/17 180 lb (81.6 kg)     Other studies personally reviewed: Additional studies/ records that were reviewed today include As above     Last device remote is reviewed from [employer] PDF dated 4/20 which reveals normal device function,   arrhythmias -persistent atrial fibrillation in the setting of complete heart block   ASSESSMENT & PLAN:    Complete heart block  CAD  Prior MI  Congestive heart failure-diastolic-acute/chronic  Pacemaker-St. Jude  DVT-recurrent  Daytime somnolence/obstructive nocturnal breathing  DOE  Atrial fibrillation/flutter-persistent-long-term  The patient has long-term persistent atrial fibrillation in the context of hypertrophic heart disease with diastolic heart failure.  Strategies include augmented diuresis, decreasing fluid intake in an effort to restore sinus rhythm.   We will plan to begin amiodarone 400 mg twice daily for 2 weeks then 400 mg daily for 2 weeks and 200 mg a day.  We will plan cardioversion sometime in the 3-4-week timeframe depending on COVID  Increase his diuretics from 1 1/2 to 2 1/2 pills x 3 d ( ie 60>100 mg qam  Decrease PO intake fluids 25%  Would llike to touch base by phone in about 10 days to check on diuresis        COVID 19 screen The patient denies symptoms of COVID 19 at this time.  The importance of social distancing was discussed today.  Follow-up: 6,m Next remote: As Scheduled   Current  medicines are reviewed at length with the patient today.   The patient does not have concerns regarding his medicines.  The following changes were made today:  As above Begin amiodarone Increase furosemide from 60>>100 mg x 3d   Labs/ tests ordered today include:  See Below  No orders of the defined types were placed in this encounter.   Future tests ( post COVID )  DCCV and amio labs  Months within 2 months  TSH and LFTs normal 8/19   Patient Risk:  after full review of this patients clinical status, I feel that they are at moderate risk at this time.  Today, I have spent 15 minutes with the patient with telehealth technology discussing the above.  Signed, Virl Axe, MD  05/19/2018 3:16 PM     [facility] [address] [place] [address] [phone] (office) [phone] (fax)

## 2018-05-19 NOTE — H&P (View-Only) (Signed)
Electrophysiology TeleHealth Note   Due to national recommendations of social distancing due to COVID 19, an audio/video telehealth visit is felt to be most appropriate for this patient at this time.  See MyChart message from today for the patient's consent to telehealth for [facility].   Date:  05/19/2018   ID:  Lance Smith, DOB [birth year], MRN [id number]  Location: patient's home  Provider location: [address], [place]  Evaluation Performed: Follow-up visit  PCP:  Burnard Bunting, MD  Cardiologist:     Electrophysiologist:  SK   Chief Complaint:  chf   History of Present Illness:    Lance Smith is a 85 y.o. male who presents via audio/video conferencing for a telehealth visit today. The patient did not have access to video technology/had technical difficulties with video requiring transitioning to audio format only (telephone).  All issues noted in this document were discussed and addressed.  No physical exam could be performed with this format.        Since last being seen in our clinic for complete heart block in the setting of hypertrophic cardiomyopathy and persistent atrial fibrillation, the patient reports *progressive shortness of breath, peripheral edema and fatigue.  He continues to smoke.  Device interrogation demonstrates persistent atrial fibrillation since 11/19.  His edema is asymmetric left greater than right associated with DVT  Diet is salt deplete; fluid replete   DATE TEST    5/13    Echo   EF 55-65 %   6/15 Myoview   EF 51 % fixed, medium-sized moderate basal inferior and inferoseptal perfusion defect and a fixed small, mild mid inferolateral perfusion defect.  9/18 Echo  55-60% Asymmetric septal fibrillation      He also has history of recurrent lower extremity DVT; Lance Smith here.  Are you working  on chronic anticoagulation with Rivaroxaban Date Cr Hgb  6/15 1.36 14.1  5/19  1.31 14.8  8/19 1.2 14.2      The patient denies symptoms of fevers, chills, cough, or new SOB worrisome for COVID 19.    Past Medical History:  Diagnosis Date  . Adenomatous colon polyp 01/2002  . Arthritis   . CKD (chronic kidney disease) stage 3, GFR 30-59 ml/min (HCC)   . Complete heart block ([place])    a. 05/2011 s/p SJM Model [id number] Dual Chamber PPM  . COPD (chronic obstructive pulmonary disease) ([place])   . Diabetes mellitus ([place])   . Diastolic CHF ([place])    a. 08/5883 Echo: EF 55-60%  . DVT (deep venous thrombosis), unspecified laterality    a. previously on coumadin - d/c'd spring of 2013  . Hyperglycemia   . Hyperlipidemia   . Hypertension   . Inferior myocardial infarction ([place]) 08/22/2013  . Steroid dependence   . Tobacco abuse     Past Surgical History:  Procedure Laterality Date  . bilateral inguinal hernia    . MELANOMA EXCISION    . PACEMAKER INSERTION  May 2013  . PERMANENT PACEMAKER INSERTION Left 05/20/2011   Procedure: PERMANENT PACEMAKER INSERTION;  Surgeon: Deboraha Sprang, MD;  Location: [facility] CATH LAB;  Service: Cardiovascular;  Laterality: Left;    Current Outpatient Medications  Medication Sig Dispense Refill  . Acetaminophen 500 MG coapsule Take 2 capsules by mouth every 6 (six) hours as needed (pain).     . furosemide (LASIX) 40 MG tablet Take 1 tablet (40 mg) by mouth in the AM and take an  1/2  tablet (20 mg) by mouth in the PM 45 tablet 11  . metFORMIN (GLUCOPHAGE) 500 MG tablet Take 500 mg by mouth daily.    . mometasone-formoterol (DULERA) 200-5 MCG/ACT AERO Inhale 2 puffs into the lungs 2 (two) times daily as needed for wheezing or shortness of breath.     . Multiple Vitamins-Minerals (CENTRUM SILVER ADULT 50+ PO) Take 1 tablet by mouth daily.     . nitroGLYCERIN (NITROSTAT) 0.4 MG SL tablet Place 0.4 mg under the tongue every 5 (five) minutes as needed. For chest pain    . predniSONE (DELTASONE) 10 MG tablet Take 10 mg by mouth every morning.     Marland Kitchen RAPAFLO 8 MG CAPS capsule Take 8 mg  by mouth daily with breakfast.     . rivaroxaban (XARELTO) 20 MG TABS tablet Take 1 tablet (20 mg total) by mouth daily. 30 tablet 5  . simvastatin (ZOCOR) 40 MG tablet Take 40 mg by mouth every morning.      No current facility-administered medications for this visit.     Allergies:   Tetanus toxoids; Aspirin; and Penicillins   Social History:  The patient  reports that he has been smoking cigarettes. He has been smoking about 2.00 packs per day. He has never used smokeless tobacco. He reports that he does not drink alcohol or use drugs.   Family History:  The patient's   family history includes Leukemia in his mother.   ROS:  Please see the history of present illness.   All other systems are personally reviewed and negative.    Exam:    Vital Signs:  BP 133/71   Pulse 70   Ht 5\' 9"  (1.753 m)   Wt 175 lb 3.2 oz (79.5 kg)   BMI 25.87 kg/m      Labs/Other Tests and Data Reviewed:    Recent Labs: 05/20/2017: BUN 29; Creatinine, Ser 1.31; Potassium 3.8; Sodium 142   Wt Readings from Last 3 Encounters:  05/19/18 175 lb 3.2 oz (79.5 kg)  09/29/17 179 lb (81.2 kg)  09/27/17 180 lb (81.6 kg)     Other studies personally reviewed: Additional studies/ records that were reviewed today include As above     Last device remote is reviewed from [facility] PDF dated 4/20 which reveals normal device function,   arrhythmias -persistent atrial fibrillation in the setting of complete heart block   ASSESSMENT & PLAN:    Complete heart block  CAD  Prior MI  Congestive heart failure-diastolic-acute/chronic  Pacemaker-St. Jude  DVT-recurrent  Daytime somnolence/obstructive nocturnal breathing  DOE  Atrial fibrillation/flutter-persistent-long-term  The patient has long-term persistent atrial fibrillation in the context of hypertrophic heart disease with diastolic heart failure.  Strategies include augmented diuresis, decreasing fluid intake in an effort to restore sinus rhythm.   We will plan to begin amiodarone 400 mg twice daily for 2 weeks then 400 mg daily for 2 weeks and 200 mg a day.  We will plan cardioversion sometime in the 3-4-week timeframe depending on COVID  Increase his diuretics from 1 1/2 to 2 1/2 pills x 3 d ( ie 60>100 mg qam  Decrease PO intake fluids 25%  Would llike to touch base by phone in about 10 days to check on diuresis        COVID 19 screen The patient denies symptoms of COVID 19 at this time.  The importance of social distancing was discussed today.  Follow-up: 6,m Next remote: As Scheduled   Current  medicines are reviewed at length with the patient today.   The patient does not have concerns regarding his medicines.  The following changes were made today:  As above Begin amiodarone Increase furosemide from 60>>100 mg x 3d   Labs/ tests ordered today include:  See Below  No orders of the defined types were placed in this encounter.   Future tests ( post COVID )  DCCV and amio labs  Months within 2 months  TSH and LFTs normal 8/19   Patient Risk:  after full review of this patients clinical status, I feel that they are at moderate risk at this time.  Today, I have spent 15 minutes with the patient with telehealth technology discussing the above.  Signed, Virl Axe, MD  05/19/2018 3:16 PM     [facility] [address] [place] [address] [phone] (office) [phone] (fax)

## 2018-05-19 NOTE — Patient Instructions (Signed)
Called and discussed the following with pt's wife regarding Dr Olin Pia recommendation:  Medication Instructions:   1. Increase your Lasix to 2 and 1/2 tablets per day for 3 days.  2. Begin Amiodarone:  Starting May 3, begin 400mg  (two tablet) two times per day for 2 weeks Starting May 17, begin 200mg  (one tablet) two times per day for 2 weeks Starting May 31, begin 200mg  (one tablet) once daily  Labwork: Your physician recommends that you return for lab work around the week of May 31st for Amiodarone surveillance lab.   Testing/Procedures: Your physician has recommended that you have a Cardioversion (DCCV). Electrical Cardioversion uses a jolt of electricity to your heart either through paddles or wired patches attached to your chest. This is a controlled, usually prescheduled, procedure. Defibrillation is done under light anesthesia in the hospital, and you usually go home the day of the procedure. This is done to get your heart back into a normal rhythm. You are not awake for the procedure. Please see the instruction sheet given to you today.  I will be contacting you the week of May 31st to discuss a cardioversion  Follow-Up: Your physician recommends that you schedule a follow-up appointment after your cardioversion has been performed.  Any Other Special Instructions Will Be Listed Below (If Applicable).     If you need a refill on your cardiac medications before your next appointment, please call your pharmacy.

## 2018-06-08 ENCOUNTER — Telehealth: Payer: Self-pay | Admitting: Internal Medicine

## 2018-06-08 DIAGNOSIS — I4891 Unspecified atrial fibrillation: Secondary | ICD-10-CM

## 2018-06-08 MED ORDER — FUROSEMIDE 40 MG PO TABS: 60.0000 mg | ORAL_TABLET | Freq: Every day | ORAL | 11 refills | Status: DC

## 2018-06-08 NOTE — Telephone Encounter (Signed)
Pts wife calling to let us know his lower extremities are extremely swollen and he has some complaints of SOB. She is not sure of his weight gain but sometimes can be 5 lbs heavier in the morning before taking his lasix. I advised her to give him an extra 40mg  lasix now and for him to take his normal dose in the evening. I will speak with Dr Caryl Comes about diuresis and call her back this evening.

## 2018-06-08 NOTE — Telephone Encounter (Signed)
Per Dr Caryl Comes, pt should increase lasix to 80mg  qd for the next 3 days. Then he should begin 60mg  qd versus 40mg  in the AM and 20mg  in the PM.   I will be contacting pt and his wife back with cardioversion instructions tomorrow. She has verbalized understanding and had no additional questions.

## 2018-06-08 NOTE — Telephone Encounter (Signed)
Pt c/o swelling: STAT is pt has developed SOB within 24 hours  1) How much weight have you gained and in what time span? Not sure  2) If swelling, where is the swelling located? All over more around feet and legs.  3) Are you currently taking a fluid pill? Yes   4) Are you currently SOB? Yes   5) Do you have a log of your daily weights (if so, list)? no  6) Have you gained 3 pounds in a day or 5 pounds in a week? Yes 5 pounds overnight   7) Have you traveled recently? No

## 2018-06-09 NOTE — Telephone Encounter (Signed)
Spoke with pt's wife today regarding his upcoming DCCV. We reviewed and she verbalized understanding of the following:   You are scheduled for a  Cardioversion on May 29 with Dr. Harrington Challenger.  Please arrive at the [facility] (Main Entrance A) at [facility]: [address] [place], [place] [address] at 10am.   DIET: Nothing to eat or drink after midnight except a sip of water with medications (see medication instructions below)  Medication Instructions: Hold all medications the morning of your procedure except Xarelto and Amiodarone.   Continue your anticoagulant: Xarelto  You will need to continue your anticoagulant after your procedure until you  are told by your rovpider that it is safe to stop.   Labs:  Come to: [place] Location before your COVID screening for lab work: CBC, BMP, LFT, and TSH  Then, you will need to drive to [employer] screening drive-up area the same day at 10:15am. Please have your blood work done before your COVID screening.  You must have a responsible person to drive you home. You will not be able to have any visitors while in the hospital. Failure to do so could result in cancellation.  Bring your insurance cards.  *Special Note: Every effort is made to have your procedure done on time. Occasionally there are emergencies that occur at the hospital that may cause delays. Please be patient if a delay does occur.     Pt's wife repeated instructions back to me and stated she had no questions. She will call us if one arises.

## 2018-06-13 ENCOUNTER — Telehealth: Payer: Self-pay | Admitting: Internal Medicine

## 2018-06-13 ENCOUNTER — Other Ambulatory Visit: Payer: Medicare Other

## 2018-06-13 ENCOUNTER — Other Ambulatory Visit (HOSPITAL_COMMUNITY): Payer: Medicare Other

## 2018-06-13 ENCOUNTER — Other Ambulatory Visit: Payer: Self-pay

## 2018-06-13 DIAGNOSIS — I4891 Unspecified atrial fibrillation: Secondary | ICD-10-CM

## 2018-06-13 LAB — CBC
Hematocrit: 41.1 % (ref 37.5–51.0)
Hemoglobin: 14.5 g/dL (ref 13.0–17.7)
MCH: 35.2 pg — ABNORMAL HIGH (ref 26.6–33.0)
MCHC: 35.3 g/dL (ref 31.5–35.7)
MCV: 100 fL — ABNORMAL HIGH (ref 79–97)
Platelets: 251 10*3/uL (ref 150–450)
RBC: 4.12 x10E6/uL — ABNORMAL LOW (ref 4.14–5.80)
RDW: 11.6 % (ref 11.6–15.4)
WBC: 15 10*3/uL — ABNORMAL HIGH (ref 3.4–10.8)

## 2018-06-13 LAB — BASIC METABOLIC PANEL
BUN/Creatinine Ratio: 20 (ref 10–24)
BUN: 29 mg/dL — ABNORMAL HIGH (ref 8–27)
CO2: 23 mmol/L (ref 20–29)
Calcium: 9 mg/dL (ref 8.6–10.2)
Chloride: 100 mmol/L (ref 96–106)
Creatinine, Ser: 1.47 mg/dL — ABNORMAL HIGH (ref 0.76–1.27)
GFR calc Af Amer: 50 mL/min/{1.73_m2} — ABNORMAL LOW (ref >59)
GFR calc non Af Amer: 43 mL/min/{1.73_m2} — ABNORMAL LOW (ref >59)
Glucose: 163 mg/dL — ABNORMAL HIGH (ref 65–99)
Potassium: 4.1 mmol/L (ref 3.5–5.2)
Sodium: 140 mmol/L (ref 134–144)

## 2018-06-13 LAB — HEPATIC FUNCTION PANEL
ALT: 14 IU/L (ref 0–44)
AST: 16 IU/L (ref 0–40)
Albumin: 3.9 g/dL (ref 3.6–4.6)
Alkaline Phosphatase: 84 IU/L (ref 39–117)
Bilirubin Total: 0.4 mg/dL (ref 0.0–1.2)
Bilirubin, Direct: 0.14 mg/dL (ref 0.00–0.40)
Total Protein: 6.1 g/dL (ref 6.0–8.5)

## 2018-06-13 LAB — TSH: TSH: 2.31 u[IU]/mL (ref 0.450–4.500)

## 2018-06-13 NOTE — Telephone Encounter (Signed)
Pt came into the office for labs and pt asked for samples of Xarelto. I asked the pt if he would like to apply for pt assistance and pt stated yes. I gave the pt 2 weeks supply of Xarelto samples 20 mg tablet and an application for Louanna Raw asking pt to return application ASAP to [employer], LPN. Pt verbalized understanding. FYI

## 2018-06-14 ENCOUNTER — Other Ambulatory Visit (HOSPITAL_COMMUNITY)
Admission: RE | Admit: 2018-06-14 | Discharge: 2018-06-14 | Disposition: A | Discharge status: Home / Self Care | Payer: Medicare Other | Source: Ambulatory Visit | Attending: Internal Medicine | Admitting: Internal Medicine

## 2018-06-14 DIAGNOSIS — Z1159 Encounter for screening for other viral diseases: Secondary | ICD-10-CM | POA: Diagnosis present

## 2018-06-14 LAB — SARS CORONAVIRUS 2 BY RT PCR (HOSPITAL ORDER, PERFORMED IN ~~LOC~~ HOSPITAL LAB): SARS Coronavirus 2: NEGATIVE

## 2018-06-16 ENCOUNTER — Ambulatory Visit (HOSPITAL_COMMUNITY): Payer: Medicare Other | Admitting: Anesthesiology

## 2018-06-16 ENCOUNTER — Encounter (HOSPITAL_COMMUNITY)
Admission: RE | Disposition: A | Discharge status: Home / Self Care | Payer: Self-pay | Source: Home / Self Care | Attending: Internal Medicine

## 2018-06-16 ENCOUNTER — Encounter (HOSPITAL_COMMUNITY): Payer: Self-pay

## 2018-06-16 ENCOUNTER — Ambulatory Visit (HOSPITAL_COMMUNITY)
Admission: RE | Admit: 2018-06-16 | Discharge: 2018-06-16 | Disposition: A | Discharge status: Home / Self Care | Payer: Medicare Other | Attending: Internal Medicine | Admitting: Internal Medicine

## 2018-06-16 ENCOUNTER — Other Ambulatory Visit: Payer: Self-pay

## 2018-06-16 DIAGNOSIS — J449 Chronic obstructive pulmonary disease, unspecified: Secondary | ICD-10-CM | POA: Diagnosis not present

## 2018-06-16 DIAGNOSIS — I422 Other hypertrophic cardiomyopathy: Secondary | ICD-10-CM | POA: Diagnosis not present

## 2018-06-16 DIAGNOSIS — Z886 Allergy status to analgesic agent status: Secondary | ICD-10-CM | POA: Diagnosis not present

## 2018-06-16 DIAGNOSIS — Z7901 Long term (current) use of anticoagulants: Secondary | ICD-10-CM | POA: Diagnosis not present

## 2018-06-16 DIAGNOSIS — Z85820 Personal history of malignant melanoma of skin: Secondary | ICD-10-CM | POA: Insufficient documentation

## 2018-06-16 DIAGNOSIS — I252 Old myocardial infarction: Secondary | ICD-10-CM | POA: Insufficient documentation

## 2018-06-16 DIAGNOSIS — Z880 Allergy status to penicillin: Secondary | ICD-10-CM | POA: Diagnosis not present

## 2018-06-16 DIAGNOSIS — E785 Hyperlipidemia, unspecified: Secondary | ICD-10-CM | POA: Diagnosis not present

## 2018-06-16 DIAGNOSIS — M1990 Unspecified osteoarthritis, unspecified site: Secondary | ICD-10-CM | POA: Insufficient documentation

## 2018-06-16 DIAGNOSIS — I4892 Unspecified atrial flutter: Secondary | ICD-10-CM | POA: Insufficient documentation

## 2018-06-16 DIAGNOSIS — I4819 Other persistent atrial fibrillation: Secondary | ICD-10-CM | POA: Insufficient documentation

## 2018-06-16 DIAGNOSIS — E1122 Type 2 diabetes mellitus with diabetic chronic kidney disease: Secondary | ICD-10-CM | POA: Diagnosis not present

## 2018-06-16 DIAGNOSIS — Z79899 Other long term (current) drug therapy: Secondary | ICD-10-CM | POA: Insufficient documentation

## 2018-06-16 DIAGNOSIS — Z7951 Long term (current) use of inhaled steroids: Secondary | ICD-10-CM | POA: Diagnosis not present

## 2018-06-16 DIAGNOSIS — Z7984 Long term (current) use of oral hypoglycemic drugs: Secondary | ICD-10-CM | POA: Insufficient documentation

## 2018-06-16 DIAGNOSIS — I4891 Unspecified atrial fibrillation: Secondary | ICD-10-CM | POA: Diagnosis not present

## 2018-06-16 DIAGNOSIS — Z1159 Encounter for screening for other viral diseases: Secondary | ICD-10-CM | POA: Diagnosis not present

## 2018-06-16 DIAGNOSIS — I130 Hypertensive heart and chronic kidney disease with heart failure and stage 1 through stage 4 chronic kidney disease, or unspecified chronic kidney disease: Secondary | ICD-10-CM | POA: Diagnosis not present

## 2018-06-16 DIAGNOSIS — I442 Atrioventricular block, complete: Secondary | ICD-10-CM | POA: Insufficient documentation

## 2018-06-16 DIAGNOSIS — I2510 Atherosclerotic heart disease of native coronary artery without angina pectoris: Secondary | ICD-10-CM | POA: Diagnosis not present

## 2018-06-16 DIAGNOSIS — N183 Chronic kidney disease, stage 3 (moderate): Secondary | ICD-10-CM | POA: Diagnosis not present

## 2018-06-16 DIAGNOSIS — E1165 Type 2 diabetes mellitus with hyperglycemia: Secondary | ICD-10-CM | POA: Diagnosis not present

## 2018-06-16 DIAGNOSIS — Z950 Presence of cardiac pacemaker: Secondary | ICD-10-CM | POA: Insufficient documentation

## 2018-06-16 DIAGNOSIS — F17210 Nicotine dependence, cigarettes, uncomplicated: Secondary | ICD-10-CM | POA: Diagnosis not present

## 2018-06-16 DIAGNOSIS — I5032 Chronic diastolic (congestive) heart failure: Secondary | ICD-10-CM | POA: Diagnosis not present

## 2018-06-16 DIAGNOSIS — Z86718 Personal history of other venous thrombosis and embolism: Secondary | ICD-10-CM | POA: Insufficient documentation

## 2018-06-16 HISTORY — PX: CARDIOVERSION: SHX1299

## 2018-06-16 LAB — GLUCOSE, CAPILLARY: Glucose-Capillary: 141 mg/dL — ABNORMAL HIGH (ref 70–99)

## 2018-06-16 SURGERY — CARDIOVERSION
Anesthesia: General

## 2018-06-16 MED ORDER — PROPOFOL 10 MG/ML IV BOLUS
INTRAVENOUS | Status: DC | PRN
  Administered 2018-06-16: 60 mg via INTRAVENOUS

## 2018-06-16 MED ORDER — SODIUM CHLORIDE 0.9 % IV SOLN
INTRAVENOUS | Status: DC
  Administered 2018-06-16: 20 mL/h via INTRAVENOUS

## 2018-06-16 MED ORDER — LIDOCAINE 2% (20 MG/ML) 5 ML SYRINGE
INTRAMUSCULAR | Status: DC | PRN
  Administered 2018-06-16: 80 mg via INTRAVENOUS

## 2018-06-16 NOTE — Interval H&P Note (Signed)
History and Physical Interval Note:  06/16/2018 11:04 AM  Lance Smith  has presented today for surgery, with the diagnosis of atrial fibrillation.  The various methods of treatment have been discussed with the patient and family. After consideration of risks, benefits and other options for treatment, the patient has consented to  Procedure(s): CARDIOVERSION (N/A) as a surgical intervention.  The patient's history has been reviewed, patient examined, no change in status, stable for surgery.  I have reviewed the patient's chart and labs.  Questions were answered to the patient's satisfaction.     Dorris Carnes

## 2018-06-16 NOTE — Discharge Instructions (Signed)
°  YOU HAD AN CARDIAC PROCEDURE TODAY: Refer to the procedure report and other information in the discharge instructions given to you for any specific questions about what was found during the examination. If this information does not answer your questions, please call [facility] office at [phone] to clarify.  ° °DIET: Your first meal following the procedure should be a light meal and then it is ok to progress to your normal diet. A half-sandwich or bowl of soup is an example of a good first meal. Heavy or fried foods are harder to digest and may make you feel nauseous or bloated. Drink plenty of fluids but you should avoid alcoholic beverages for 24 hours. If you had a esophageal dilation, please see attached instructions for diet.  ° °ACTIVITY: Your care partner should take you home directly after the procedure. You should plan to take it easy, moving slowly for the rest of the day. You can resume normal activity the day after the procedure however YOU SHOULD NOT DRIVE, use power tools, machinery or perform tasks that involve climbing or major physical exertion for 24 hours (because of the sedation medicines used during the test).  ° °SYMPTOMS TO REPORT IMMEDIATELY: °A cardiologist can be reached at any hour. Please call [phone] for any of the following symptoms:  °Vomiting of blood or coffee ground material  °New, significant abdominal pain  °New, significant chest pain or pain under the shoulder blades  °Painful or persistently difficult swallowing  °New shortness of breath  °Black, tarry-looking or red, bloody stools ° °FOLLOW UP:  °Please also call with any specific questions about appointments or follow up tests. ° ° °

## 2018-06-16 NOTE — Transfer of Care (Signed)
Immediate Anesthesia Transfer of Care Note  Patient: Lance Smith  Procedure(s) Performed: CARDIOVERSION (N/A )  Patient Location: Endoscopy Unit  Anesthesia Type:General  Level of Consciousness: drowsy  Airway & Oxygen Therapy: Patient Spontanous Breathing and Patient connected to nasal cannula oxygen  Post-op Assessment: Report given to RN, Post -op Vital signs reviewed and stable and Patient moving all extremities  Post vital signs: Reviewed and stable  Last Vitals:  Vitals Value Taken Time  BP 104/52 06/16/2018 11:19 AM  Temp    Pulse 61 06/16/2018 11:19 AM  Resp 30 06/16/2018 11:19 AM  SpO2 90 % 06/16/2018 11:19 AM    Last Pain:  Vitals:   06/16/18 1119  TempSrc: Oral  PainSc:          Complications: No apparent anesthesia complications

## 2018-06-16 NOTE — Anesthesia Preprocedure Evaluation (Signed)
Anesthesia Evaluation  Patient identified by MRN, date of birth, ID band Patient awake    Reviewed: Allergy & Precautions, NPO status , Patient's Chart, lab work & pertinent test results  Airway Mallampati: II  TM Distance: >3 FB Neck ROM: Full    Dental no notable dental hx.    Pulmonary COPD, Current Smoker,    Pulmonary exam normal breath sounds clear to auscultation       Cardiovascular hypertension, Pt. on medications + Past MI and +CHF  Normal cardiovascular exam+ dysrhythmias Atrial Fibrillation + pacemaker  Rhythm:Irregular Rate:Normal     Neuro/Psych negative neurological ROS  negative psych ROS   GI/Hepatic negative GI ROS, Neg liver ROS,   Endo/Other  negative endocrine ROSdiabetes, Type 2  Renal/GU negative Renal ROS  negative genitourinary   Musculoskeletal  (+) Arthritis , Osteoarthritis,    Abdominal   Peds negative pediatric ROS (+)  Hematology negative hematology ROS (+)   Anesthesia Other Findings   Reproductive/Obstetrics negative OB ROS                             Anesthesia Physical Anesthesia Plan  ASA: III  Anesthesia Plan: General   Post-op Pain Management:    Induction: Intravenous  PONV Risk Score and Plan: 1 and Ondansetron and Treatment may vary due to age or medical condition  Airway Management Planned: Mask  Additional Equipment:   Intra-op Plan:   Post-operative Plan:   Informed Consent: I have reviewed the patients History and Physical, chart, labs and discussed the procedure including the risks, benefits and alternatives for the proposed anesthesia with the patient or authorized representative who has indicated his/her understanding and acceptance.     Dental advisory given  Plan Discussed with: CRNA  Anesthesia Plan Comments:         Anesthesia Quick Evaluation

## 2018-06-16 NOTE — Anesthesia Postprocedure Evaluation (Signed)
Anesthesia Post Note  Patient: Lance Smith  Procedure(s) Performed: CARDIOVERSION (N/A )     Patient location during evaluation: Endoscopy Anesthesia Type: General Level of consciousness: awake and alert Pain management: pain level controlled Vital Signs Assessment: post-procedure vital signs reviewed and stable Respiratory status: spontaneous breathing, nonlabored ventilation and respiratory function stable Cardiovascular status: blood pressure returned to baseline and stable Postop Assessment: no apparent nausea or vomiting Anesthetic complications: no    Last Vitals:  Vitals:   06/16/18 1119 06/16/18 1129  BP: (!) 104/52 (!) 118/55  Pulse: 61 61  Resp: (!) 30 20  Temp: 36.6 C   SpO2: 90% 96%    Last Pain:  Vitals:   06/16/18 1119  TempSrc: Axillary  PainSc: 0-No pain                 Lynda Rainwater

## 2018-06-16 NOTE — CV Procedure (Signed)
Cardioversion  Pt sedated by anesthesia with propofol and lidocaine  With pads in AP position patient cardioverted to SR with 200J synchronized biphasic energy   Procedure was without complication  Pacer to be interrogated.  Dorris Carnes MD

## 2018-06-19 ENCOUNTER — Encounter (HOSPITAL_COMMUNITY): Payer: Self-pay | Admitting: Internal Medicine

## 2018-06-19 ENCOUNTER — Other Ambulatory Visit: Payer: Self-pay | Admitting: Physician Assistant

## 2018-06-23 ENCOUNTER — Other Ambulatory Visit: Payer: Self-pay | Admitting: Physician Assistant

## 2018-06-26 NOTE — Telephone Encounter (Signed)
F/U Message             Patient is calling again checking status on his refill, again this is the 2/3 call and no refill has being sent in yet. Pls call and advise.

## 2018-08-09 ENCOUNTER — Ambulatory Visit (INDEPENDENT_AMBULATORY_CARE_PROVIDER_SITE_OTHER): Payer: Medicare Other | Admitting: *Deleted

## 2018-08-09 DIAGNOSIS — I442 Atrioventricular block, complete: Secondary | ICD-10-CM | POA: Diagnosis not present

## 2018-08-09 DIAGNOSIS — I480 Paroxysmal atrial fibrillation: Secondary | ICD-10-CM

## 2018-08-10 ENCOUNTER — Telehealth: Payer: Self-pay

## 2018-08-10 LAB — CUP PACEART REMOTE DEVICE CHECK
Battery Remaining Longevity: 61 mo
Battery Remaining Percentage: 46 %
Battery Voltage: 2.86 V
Brady Statistic AP VP Percent: 39 %
Brady Statistic AP VS Percent: 1 %
Brady Statistic AS VP Percent: 60 %
Brady Statistic AS VS Percent: 1 %
Brady Statistic RA Percent Paced: 5.1 %
Brady Statistic RV Percent Paced: 99 %
Date Time Interrogation Session: 20200722150034
Implantable Lead Implant Date: 20130502
Implantable Lead Implant Date: 20130502
Implantable Lead Location: 753859
Implantable Lead Location: 753860
Implantable Pulse Generator Implant Date: 20130502
Lead Channel Impedance Value: 390 Ohm
Lead Channel Impedance Value: 480 Ohm
Lead Channel Pacing Threshold Amplitude: 0.625 V
Lead Channel Pacing Threshold Amplitude: 0.75 V
Lead Channel Pacing Threshold Pulse Width: 0.5 ms
Lead Channel Pacing Threshold Pulse Width: 0.5 ms
Lead Channel Sensing Intrinsic Amplitude: 12 mV
Lead Channel Sensing Intrinsic Amplitude: 3.4 mV
Lead Channel Setting Pacing Amplitude: 0.875
Lead Channel Setting Pacing Amplitude: 2 V
Lead Channel Setting Pacing Pulse Width: 0.5 ms
Lead Channel Setting Sensing Sensitivity: 4 mV
Pulse Gen Model: 2110
Pulse Gen Serial Number: 7321242

## 2018-08-10 NOTE — Telephone Encounter (Signed)
M  Could you ask him whether the SOB that we talked about before his DCCV got better after he was starteed on amio or underwent cardioversion   Thanks  sk

## 2018-08-10 NOTE — Telephone Encounter (Signed)
Pt had cardioversion 06/16/18. Presenting rhythm is Aflutter. AT/AF burden 87%. Pt states he has been feeling good lately. Will route to Dr. Caryl Comes for review.

## 2018-08-14 NOTE — Telephone Encounter (Signed)
LMOVM to ask pt about SOB per Dr. Caryl Comes.

## 2018-08-14 NOTE — Telephone Encounter (Signed)
Pt states that his SOB has not gotten any better and he couldn't tell a difference after the cardioversion or starting amio.

## 2018-08-17 NOTE — Telephone Encounter (Signed)
New Message    Patient returning your call please call patient back.

## 2018-08-18 NOTE — Telephone Encounter (Signed)
Follow up    Patients wife is returning call in reference to device. The contact number is  [phone]

## 2018-08-18 NOTE — Telephone Encounter (Signed)
Transmission received. Persistent AF since 07/21/18. V rates controlled. No high ventricular rates noted. Battery and lead trends stable.    Presenting rhythm:

## 2018-08-18 NOTE — Telephone Encounter (Signed)
Spoke with patient's wife. Explained we are waiting on instructions from Dr. Caryl Comes. Pt continues to have no energy, sleeps all day per wife. Ongoing for the past couple of months. Confirmed with wife that per 08/14/18 phone note, pt did not seem to be less ShOB after DCCV.   Pt's weight fluctuates 2-3lbs each day, weighed 169lb this AM and 167lbs yesterday. Taking all meds, including amiodarone, Xarelto, and furosemide as instructed. Minimal urine output after taking furosemide, wife reports she thinks it is due to his enlarged prostate. Minimal ShOB at rest today, mostly with exertion. Pt's wife notes he will sometimes "clutch his chest" but doesn't say he's having chest pain. BLE edema worse in past 2 weeks, doesn't improve with elevation per wife. Decreased fluid intake by 25% as instructed by Dr. Caryl Comes in 05/2018, minimal sodium intake.  Discussed with Sherri, RN (TTL), plan to offer APP f/u next week to address fluid status. Pt's wife is agreeable to an appointment with B. Simmons, [place], on 08/21/18 at 2:45pm. She is aware pt will need to come to the appointment alone and he wear a mask. She agrees to send a PPM transmission for review so that data is available at this appointment. No further questions at this time.      COVID-19 Pre-Screening Questions:  . In the past 7 to 10 days have you had a cough,  shortness of breath, headache, congestion, fever (100 or greater) body aches, chills, sore throat, or sudden loss of taste or sense of smell?  . Have you been around anyone with known Covid 19. . Have you been around anyone who is awaiting Covid 19 test results in the past 7 to 10 days? . Have you been around anyone who has been exposed to Covid 19, or has mentioned symptoms of Covid 19 within the past 7 to 10 days?  If you have any concerns/questions about symptoms patients report during screening (either on the phone or at threshold). Contact the provider seeing the patient or DOD for further  guidance.  If neither are available contact a member of the leadership team.   Pt's wife answered "no" to all questions.

## 2018-08-21 ENCOUNTER — Ambulatory Visit (INDEPENDENT_AMBULATORY_CARE_PROVIDER_SITE_OTHER): Payer: Medicare Other | Admitting: Cardiology

## 2018-08-21 ENCOUNTER — Encounter: Payer: Self-pay | Admitting: Cardiology

## 2018-08-21 ENCOUNTER — Other Ambulatory Visit: Payer: Self-pay

## 2018-08-21 VITALS — BP 108/60 | HR 70 | Ht 69.0 in | Wt 174.6 lb

## 2018-08-21 DIAGNOSIS — I442 Atrioventricular block, complete: Secondary | ICD-10-CM

## 2018-08-21 DIAGNOSIS — R0600 Dyspnea, unspecified: Secondary | ICD-10-CM | POA: Diagnosis not present

## 2018-08-21 DIAGNOSIS — I2510 Atherosclerotic heart disease of native coronary artery without angina pectoris: Secondary | ICD-10-CM | POA: Diagnosis not present

## 2018-08-21 DIAGNOSIS — I480 Paroxysmal atrial fibrillation: Secondary | ICD-10-CM | POA: Diagnosis not present

## 2018-08-21 NOTE — Progress Notes (Signed)
08/21/2018 Lance Smith   Mar 23, 1931  [id number]  Primary Physician Burnard Bunting, MD Primary Cardiologist: Dr. Caryl Comes  Electrophysiologist: Dr. Caryl Comes   Reason for Visit/CC: Dyspnea and persistent atrial fibrillation   HPI:  Lance Smith is a 85 y.o. male who is being seen today for dyspnea and persistent atrial fibrillation. He has h/o CHB s/p PPM followed by Dr. Caryl Comes, atrial fibrillation, chronic anticoagulation w/ Xarelto, chronic diastolic HF, tobacco abuse, COPD, CKD, h/o DVT, HTN and HLD. No known h/o CAD.   He had DCCV in May for atrial fibrillation. Was initially successful, but atrial fibrillation returned. He contacted the Device clinic recently endorsing increased dyspnea and bilateral [place]. Per recent remote device transmission 7/31, he has had Persistent AF since 07/21/18. V rates controlled. No high ventricular rates noted. Battery and lead trends stable. Pt instructed to f/u in office for further management. He was placed on my schedule today.   He reports full compliance with Lasix.  He has been taken 60 mg once daily (has 40 mg tablets and takes 1-1/2 tablets in the morning). He denies chest pain.  No tachypalpitations.  He had endorsed some lower extremity edema a few days ago but does not appear to have any edema on my examination today.  His lungs are clear to auscultation bilaterally.  No crackles or wheezing on lung exam.  He denies any weight gain.  He has actually had weight loss, 10 pounds over the course of the last 4 months.  His weight is down from 184 274 pounds. He reports full compliance w/ Xarelto. No missed doses.   Cardiac Studies  2D Echo 09/2016 Study Conclusions  - Left ventricle: The cavity size was normal. There was moderate   concentric and severe asymmetric hypertrophy of the mid and   distal septum as well as the mid and apical inferior wall and   apex. Consider cardiac MRI to rule out HOCM. Systolic function   was normal. The estimated ejection  fraction was in the range of   55% to 60%. Wall motion was normal; there were no regional wall   motion abnormalities. Features are consistent with a pseudonormal   left ventricular filling pattern, with concomitant abnormal   relaxation and increased filling pressure (grade 2 diastolic   dysfunction). Doppler parameters are consistent with high   ventricular filling pressure. - Aortic valve: Trileaflet; mildly thickened, mildly calcified   leaflets. There was trivial regurgitation. - Mitral valve: Calcified annulus. - Left atrium: The atrium was mildly dilated. - Impressions: Normal LVF with EF 55-60% with moderate concentric   hypertrophy and severe asymmetric hypertrophy of the mid and   distal septum, mid and distal inferior wall and apex. Consider   cardiac MRI to assess for HOCM variant.  Current Meds  Medication Sig  . amiodarone (PACERONE) 200 MG tablet Take 200 mg by mouth daily.   Allergies  Allergen Reactions  . Tetanus Toxoids Shortness Of Breath and Swelling  . Aspirin Nausea Only and Other (See Comments)    headache  . Penicillins Swelling    Did it involve swelling of the face/tongue/throat, SOB, or low BP? Yes Did it involve sudden or severe rash/hives, skin peeling, or any reaction on the inside of your mouth or nose? Unknown Did you need to seek medical attention at a hospital or doctor's office? Yes When did it last happen?30 years ago If all above answers are "NO", may proceed with cephalosporin use.    Past Medical  History:  Diagnosis Date  . Adenomatous colon polyp 01/2002  . Arthritis   . CKD (chronic kidney disease) stage 3, GFR 30-59 ml/min (HCC)   . Complete heart block ([place])    a. 05/2011 s/p SJM Model [id number] Dual Chamber PPM  . COPD (chronic obstructive pulmonary disease) ([place])   . Diabetes mellitus ([place])   . Diastolic CHF ([place])    a. 04/1738 Echo: EF 55-60%  . DVT (deep venous thrombosis), unspecified laterality    a. previously on coumadin -  d/c'd spring of 2013  . Hyperglycemia   . Hyperlipidemia   . Hypertension   . 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 Steroid dependence   . Tobacco abuse    Family History  Problem Relation Age of Onset  . Leukemia Mother   . Colon cancer Neg Hx    Past Surgical History:  Procedure Laterality Date  . bilateral inguinal hernia    . CARDIOVERSION N/A 06/16/2018   Procedure: CARDIOVERSION;  Surgeon: Fay Records, MD;  Location: [facility];  Service: Cardiovascular;  Laterality: N/A;  . MELANOMA EXCISION    . PACEMAKER INSERTION  May 2013  . PERMANENT PACEMAKER INSERTION Left 05/20/2011   Procedure: PERMANENT PACEMAKER INSERTION;  Surgeon: Deboraha Sprang, MD;  [facility] CATH LAB;  Service: Cardiovascular;  Laterality: Left;   Social History   Socioeconomic History  . Marital status: Married    Spouse name: Not on file  . Number of children: Not on file  . Years of education: Not on file  . Highest education level: Not on file  Occupational History  . Occupation: retired  Scientific laboratory technician  . Financial resource strain: Not on file  . Food insecurity    Worry: Not on file    Inability: Not on file  . Transportation needs    Medical: Not on file    Non-medical: Not on file  Tobacco Use  . Smoking status: Current Every Day Smoker    Packs/day: 2.00    Types: Cigarettes  . Smokeless tobacco: Never Used  Substance and Sexual Activity  . Alcohol use: No    Alcohol/week: 0.0 standard drinks  . Drug use: No  . Sexual activity: Not Currently  Lifestyle  . Physical activity    Days per week: Not on file    Minutes per session: Not on file  . Stress: Not on file  Relationships  . Social Herbalist on phone: Not on file    Gets together: Not on file    Attends religious service: Not on file    Active member of club or organization: Not on file    Attends meetings of clubs or organizations: Not on file    Relationship status: Not on file  . Intimate  partner violence    Fear of current or ex partner: Not on file    Emotionally abused: Not on file    Physically abused: Not on file    Forced sexual activity: Not on file  Other Topics Concern  . Not on file  Social History Narrative  . Not on file     Lipid Panel  No results found for: CHOL, TRIG, HDL, CHOLHDL, VLDL, LDLCALC, LDLDIRECT  Review of Systems: General: negative for chills, fever, night sweats or weight changes.  Cardiovascular: negative for chest pain, dyspnea on exertion, edema, orthopnea, palpitations, paroxysmal nocturnal dyspnea or shortness of breath Dermatological: negative for rash Respiratory: negative for cough or wheezing Urologic:  negative for hematuria Abdominal: negative for nausea, vomiting, diarrhea, bright red blood per rectum, melena, or hematemesis Neurologic: negative for visual changes, syncope, or dizziness All other systems reviewed and are otherwise negative except as noted above.   Physical Exam:  Blood pressure 108/60, pulse 70, height 5\' 9"  (1.753 m), weight 174 lb 9.6 oz (79.2 kg), SpO2 94 %.  General appearance: alert, cooperative, no distress and elderly WM Neck: no carotid bruit and no JVD Lungs: clear to auscultation bilaterally Heart: irregularly irregular rhythm and regular rate Extremities: extremities normal, atraumatic, no cyanosis or edema Pulses: 2+ and symmetric Skin: Skin color, texture, turgor normal. No rashes or lesions Neurologic: Grossly normal  EKG paced rhythm, 70 bpm underlying rhythm appears to be atrial flutter-- personally reviewed   ASSESSMENT AND PLAN:   1.  Persistent atrial fibrillation: He had DCCV in May for atrial fibrillation. Was initially successful, but atrial fibrillation returned. Per recent remote device transmission 7/31, he has had Persistent AF since 07/21/18. V rates controlled.  Current rate 70 bpm.  He was recently loaded with p.o. amiodarone and was on high-dose but was tapered down to a  maintenance dose of 200 mg once daily.  Given his breakthrough persistent atrial fibrillation despite antiarrhythmic drug therapy with amiodarone, may consider discontinuation however I will defer decision to Dr. Caryl Comes who will see him next month.  Despite maintaining normal rhythm, amiodarone may be working to help keep his rate controlled.  I do not think he will be able to tolerate AV nodal blocking agents such as Cardizem and metoprolol due to soft blood pressure.  108/60 today.  For now, we will continue low-dose amiodarone 200 mg daily along with Xarelto for anticoagulation   2.  Chronic diastolic heart failure: He appears grossly euvolemic on physical exam today.  I do not appreciate significant lower extremity edema and no crackles on lung exam.  He also endorses a 10 pound weight loss since that of weight gain.  I will plan to check a BNP to better assess volume status.  If elevated we will have him temporarily increase the dose of his Lasix for diuresis.  3.  Dyspnea: As noted above we will check a BNP today to rule out acute CHF.  His atrial fibrillation may also be contributing along with his chronic COPD.  He is on amiodarone for A. fib but no wheezing or crackles noted on exam today that would be concerning for possible amiodarone toxicity. However if no cause is identified and if symptoms worsen, may consider PFTs.  He does have a history of DVT and reports recent lower extremity edema however he has been fully compliant with Xarelto thus the likelihood of PE would be low.  Given he is on chronic Xarelto will check CBC to rule out anemia.  He denies any chest pain that would be concerning for underlying ischemia.   Follow-Up: I will follow-up on the results of his laboratory work and will advise patient if he will need to adjust his diuretic dose, otherwise we will plan to keep follow-up appointment with Dr. Caryl Comes next month.  Darrion Macaulay Ladoris Gene, MHS [facility] 08/21/2018 2:58 PM

## 2018-08-21 NOTE — Patient Instructions (Signed)
Medication Instructions:  none If you need a refill on your cardiac medications before your next appointment, please call your pharmacy.   Lab work:TODAY CBC BNP BMP If you have labs (blood work) drawn today and your tests are completely normal, you will receive your results only by: Marland Kitchen MyChart Message (if you have MyChart) OR . A paper copy in the mail If you have any lab test that is abnormal or we need to change your treatment, we will call you to review the results.  Testing/Procedures: NONE  Follow-Up: At [facility], you and your health needs are our priority.  As part of our continuing mission to provide you with exceptional heart care, we have created designated Provider Care Teams.  These Care Teams include your primary Cardiologist (physician) and Advanced Practice Providers (APPs -  Physician Assistants and Nurse Practitioners) who all work together to provide you with the care you need, when you need it. .   Any Other Special Instructions Will Be Listed Below (If Applicable).

## 2018-08-22 LAB — BASIC METABOLIC PANEL
BUN/Creatinine Ratio: 17 (ref 10–24)
BUN: 24 mg/dL (ref 8–27)
CO2: 24 mmol/L (ref 20–29)
Calcium: 9.1 mg/dL (ref 8.6–10.2)
Chloride: 101 mmol/L (ref 96–106)
Creatinine, Ser: 1.44 mg/dL — ABNORMAL HIGH (ref 0.76–1.27)
GFR calc Af Amer: 51 mL/min/{1.73_m2} — ABNORMAL LOW (ref >59)
GFR calc non Af Amer: 44 mL/min/{1.73_m2} — ABNORMAL LOW (ref >59)
Glucose: 234 mg/dL — ABNORMAL HIGH (ref 65–99)
Potassium: 5 mmol/L (ref 3.5–5.2)
Sodium: 145 mmol/L — ABNORMAL HIGH (ref 134–144)

## 2018-08-22 LAB — CBC
Hematocrit: 42.5 % (ref 37.5–51.0)
Hemoglobin: 14.4 g/dL (ref 13.0–17.7)
MCH: 34.9 pg — ABNORMAL HIGH (ref 26.6–33.0)
MCHC: 33.9 g/dL (ref 31.5–35.7)
MCV: 103 fL — ABNORMAL HIGH (ref 79–97)
Platelets: 319 10*3/uL (ref 150–450)
RBC: 4.13 x10E6/uL — ABNORMAL LOW (ref 4.14–5.80)
RDW: 12.6 % (ref 11.6–15.4)
WBC: 9.2 10*3/uL (ref 3.4–10.8)

## 2018-08-22 LAB — PRO B NATRIURETIC PEPTIDE: NT-Pro BNP: 1907 pg/mL — ABNORMAL HIGH (ref 0–486)

## 2018-08-23 ENCOUNTER — Telehealth: Payer: Self-pay

## 2018-08-23 DIAGNOSIS — E8770 Fluid overload, unspecified: Secondary | ICD-10-CM

## 2018-08-23 MED ORDER — FUROSEMIDE 40 MG PO TABS: ORAL_TABLET | ORAL | 3 refills | Status: DC

## 2018-08-23 NOTE — Telephone Encounter (Signed)
Notes recorded by Frederik Schmidt, RN on 08/23/2018 at 3:41 PM EDT  The patient has been notified of the result and verbalized understanding. All questions (if any) were answered.  Frederik Schmidt, RN 08/23/2018 3:41 PM

## 2018-08-23 NOTE — Telephone Encounter (Signed)
NOted including BNP and recommendation to increase his diuretic   Not sanguine about his afib

## 2018-08-23 NOTE — Telephone Encounter (Signed)
-----   Message from [place], [place] sent at 08/23/2018  2:44 PM EDT ----- BNP (fluid marker) is high, suggesting that his SOB is due to acute CHF. He should increase his Lasix to 40 mg BID x 7 days and f/u w./ APP in 1 week and have repeat BMP and BNP drawn at time of f/u.

## 2018-08-24 NOTE — Progress Notes (Signed)
Carelink Summary Report / Loop Recorder 

## 2018-09-01 ENCOUNTER — Other Ambulatory Visit: Payer: Medicare Other | Admitting: *Deleted

## 2018-09-01 ENCOUNTER — Other Ambulatory Visit: Payer: Self-pay

## 2018-09-01 ENCOUNTER — Ambulatory Visit (INDEPENDENT_AMBULATORY_CARE_PROVIDER_SITE_OTHER): Payer: Medicare Other | Admitting: Cardiology

## 2018-09-01 ENCOUNTER — Encounter: Payer: Self-pay | Admitting: Cardiology

## 2018-09-01 VITALS — BP 120/60 | HR 70 | Ht 69.0 in | Wt 172.0 lb

## 2018-09-01 DIAGNOSIS — I5033 Acute on chronic diastolic (congestive) heart failure: Secondary | ICD-10-CM

## 2018-09-01 DIAGNOSIS — I4819 Other persistent atrial fibrillation: Secondary | ICD-10-CM | POA: Diagnosis not present

## 2018-09-01 DIAGNOSIS — N289 Disorder of kidney and ureter, unspecified: Secondary | ICD-10-CM | POA: Diagnosis not present

## 2018-09-01 DIAGNOSIS — E8770 Fluid overload, unspecified: Secondary | ICD-10-CM

## 2018-09-01 NOTE — Patient Instructions (Signed)
Medication Instructions:  none If you need a refill on your cardiac medications before your next appointment, please call your pharmacy.   Lab work:TODAY BMP BNP If you have labs (blood work) drawn today and your tests are completely normal, you will receive your results only by: Marland Kitchen MyChart Message (if you have MyChart) OR . A paper copy in the mail If you have any lab test that is abnormal or we need to change your treatment, we will call you to review the results.  Testing/Procedures: NONE  Follow-Up: At [facility], you and your health needs are our priority.  As part of our continuing mission to provide you with exceptional heart care, we have created designated Provider Care Teams.  These Care Teams include your primary Cardiologist (physician) and Advanced Practice Providers (APPs -  Physician Assistants and Nurse Practitioners) who all work together to provide you with the care you need, when you need it. .   Any Other Special Instructions Will Be Listed Below (If Applicable). WEIGHT:  Weigh daily:  If you gain more than 3 lbs in 1 day or 5 lbs in 1 week, call our office [phone] DASH Eating Plan DASH stands for "Dietary Approaches to Stop Hypertension." The DASH eating plan is a healthy eating plan that has been shown to reduce high blood pressure (hypertension). It may also reduce your risk for type 2 diabetes, heart disease, and stroke. The DASH eating plan may also help with weight loss. What are tips for following this plan?  General guidelines  Avoid eating more than 2,300 mg (milligrams) of salt (sodium) a day. If you have hypertension, you may need to reduce your sodium intake to 1,500 mg a day.  Limit alcohol intake to no more than 1 drink a day for nonpregnant women and 2 drinks a day for men. One drink equals 12 oz of beer, 5 oz of wine, or 1 oz of hard liquor.  Work with your health care provider to maintain a healthy body weight or to lose weight. Ask what an  ideal weight is for you.  Get at least 30 minutes of exercise that causes your heart to beat faster (aerobic exercise) most days of the week. Activities may include walking, swimming, or biking.  Work with your health care provider or diet and nutrition specialist (dietitian) to adjust your eating plan to your individual calorie needs. Reading food labels   Check food labels for the amount of sodium per serving. Choose foods with less than 5 percent of the Daily Value of sodium. Generally, foods with less than 300 mg of sodium per serving fit into this eating plan.  To find whole grains, look for the word "whole" as the first word in the ingredient list. Shopping  Buy products labeled as "low-sodium" or "no salt added."  Buy fresh foods. Avoid canned foods and premade or frozen meals. Cooking  Avoid adding salt when cooking. Use salt-free seasonings or herbs instead of table salt or sea salt. Check with your health care provider or pharmacist before using salt substitutes.  Do not fry foods. Cook foods using healthy methods such as baking, boiling, grilling, and broiling instead.  Cook with heart-healthy oils, such as olive, canola, soybean, or sunflower oil. Meal planning  Eat a balanced diet that includes: ? 5 or more servings of fruits and vegetables each day. At each meal, try to fill half of your plate with fruits and vegetables. ? Up to 6-8 servings of whole grains each  day. ? Less than 6 oz of lean meat, poultry, or fish each day. A 3-oz serving of meat is about the same size as a deck of cards. One egg equals 1 oz. ? 2 servings of low-fat dairy each day. ? A serving of nuts, seeds, or beans 5 times each week. ? Heart-healthy fats. Healthy fats called Omega-3 fatty acids are found in foods such as flaxseeds and coldwater fish, like sardines, salmon, and mackerel.  Limit how much you eat of the following: ? Canned or prepackaged foods. ? Food that is high in trans fat, such  as fried foods. ? Food that is high in saturated fat, such as fatty meat. ? Sweets, desserts, sugary drinks, and other foods with added sugar. ? Full-fat dairy products.  Do not salt foods before eating.  Try to eat at least 2 vegetarian meals each week.  Eat more home-cooked food and less restaurant, buffet, and fast food.  When eating at a restaurant, ask that your food be prepared with less salt or no salt, if possible. What foods are recommended? The items listed may not be a complete list. Talk with your dietitian about what dietary choices are best for you. Grains Whole-grain or whole-wheat bread. Whole-grain or whole-wheat pasta. Brown rice. [place]. Bulgur. Whole-grain and low-sodium cereals. Pita bread. Low-fat, low-sodium crackers. Whole-wheat flour tortillas. Vegetables Fresh or frozen vegetables (raw, steamed, roasted, or grilled). Low-sodium or reduced-sodium tomato and vegetable juice. Low-sodium or reduced-sodium tomato sauce and tomato paste. Low-sodium or reduced-sodium canned vegetables. Fruits All fresh, dried, or frozen fruit. Canned fruit in natural juice (without added sugar). Meat and other protein foods Skinless chicken or [place]. Ground chicken or [place]. Pork with fat trimmed off. Fish and seafood. Egg whites. Dried beans, peas, or lentils. Unsalted nuts, nut butters, and seeds. Unsalted canned beans. Lean cuts of beef with fat trimmed off. Low-sodium, lean deli meat. Dairy Low-fat (1%) or fat-free (skim) milk. Fat-free, low-fat, or reduced-fat cheeses. Nonfat, low-sodium ricotta or cottage cheese. Low-fat or nonfat yogurt. Low-fat, low-sodium cheese. Fats and oils Soft margarine without trans fats. Vegetable oil. Low-fat, reduced-fat, or light mayonnaise and salad dressings (reduced-sodium). Canola, safflower, olive, soybean, and sunflower oils. Avocado. Seasoning and other foods Herbs. Spices. Seasoning mixes without salt. Unsalted popcorn and pretzels.  Fat-free sweets. What foods are not recommended? The items listed may not be a complete list. Talk with your dietitian about what dietary choices are best for you. Grains Baked goods made with fat, such as croissants, muffins, or some breads. Dry pasta or rice meal packs. Vegetables Creamed or fried vegetables. Vegetables in a cheese sauce. Regular canned vegetables (not low-sodium or reduced-sodium). Regular canned tomato sauce and paste (not low-sodium or reduced-sodium). Regular tomato and vegetable juice (not low-sodium or reduced-sodium). Angie Fava. Olives. Fruits Canned fruit in a light or heavy syrup. Fried fruit. Fruit in cream or butter sauce. Meat and other protein foods Fatty cuts of meat. Ribs. Fried meat. Berniece Salines. Sausage. Bologna and other processed lunch meats. Salami. Fatback. Hotdogs. Bratwurst. Salted nuts and seeds. Canned beans with added salt. Canned or smoked fish. Whole eggs or egg yolks. Chicken or [place] with skin. Dairy Whole or 2% milk, cream, and half-and-half. Whole or full-fat cream cheese. Whole-fat or sweetened yogurt. Full-fat cheese. Nondairy creamers. Whipped toppings. Processed cheese and cheese spreads. Fats and oils Butter. Stick margarine. Lard. Shortening. Ghee. Bacon fat. Tropical oils, such as coconut, palm kernel, or palm oil. Seasoning and other foods Salted popcorn and pretzels.  Onion salt, garlic salt, seasoned salt, table salt, and sea salt. Worcestershire sauce. Tartar sauce. Barbecue sauce. Teriyaki sauce. Soy sauce, including reduced-sodium. Steak sauce. Canned and packaged gravies. Fish sauce. Oyster sauce. Cocktail sauce. Horseradish that you find on the shelf. Ketchup. Mustard. Meat flavorings and tenderizers. Bouillon cubes. Hot sauce and Tabasco sauce. Premade or packaged marinades. Premade or packaged taco seasonings. Relishes. Regular salad dressings. Where to find more information:  National Heart, Lung, and [place]: https://wilson-eaton.com/   American Heart Association: www.heart.org Summary  The DASH eating plan is a healthy eating plan that has been shown to reduce high blood pressure (hypertension). It may also reduce your risk for type 2 diabetes, heart disease, and stroke.  With the DASH eating plan, you should limit salt (sodium) intake to 2,300 mg a day. If you have hypertension, you may need to reduce your sodium intake to 1,500 mg a day.  When on the DASH eating plan, aim to eat more fresh fruits and vegetables, whole grains, lean proteins, low-fat dairy, and heart-healthy fats.  Work with your health care provider or diet and nutrition specialist (dietitian) to adjust your eating plan to your individual calorie needs. This information is not intended to replace advice given to you by your health care provider. Make sure you discuss any questions you have with your health care provider. Document Released: 12/24/2010 Document Revised: 12/17/2016 Document Reviewed: 12/29/2015 Elsevier Patient Education  2020 [employer].

## 2018-09-01 NOTE — Progress Notes (Signed)
09/01/2018 Lance Smith   29-Nov-1931  [id number]  Primary Physician Burnard Bunting, MD Primary Cardiologist: Dr. Caryl Comes  Electrophysiologist: Dr. Caryl Comes  Reason for Visit/CC: f/u for a/c diastolic HF and persistent atrial fibrillation   HPI: Lance Smith is a 85 y.o. male who is being seen today for f/u for acute on chronic diastolic HF and persistent atrial fibrillation. He has h/o CHB s/p PPM followed by Dr. Caryl Comes, atrial fibrillation, chronic anticoagulation w/ Xarelto, chronic diastolic HF, tobacco abuse, COPD, CKD, h/o DVT, HTN and HLD. No known h/o CAD.   He had DCCV in May for atrial fibrillation. Was initially successful, but atrial fibrillation returned. He contacted the Device clinic recently endorsing increased dyspnea and bilateral Eliseo. Per recent remote device transmission 7/31, he has had Persistent AF since 07/21/18. V rates controlled. No high ventricular rates noted. Battery and lead trends stable. Pt instructed to f/u in office for further management. He was placed on my schedule and seen on 08/21/18. Ordered labs. CBC was normal. No anemia. BNP was elevated at 1,907. SCr 1.4 and K 5.0. He was instructed to increase his lasix to 40 mg BID x 7 days and f/u in 1 week for repeat assessment and f/u labs.   Today, he reports some slight improvement in breathing but dyspnea still present. No CP, wheezing and no palpitations. He remains compliant w/ Xarelto. HR controlled in the 70s. BP 120/60. He reports that after the increase in lasix to 40 BID, he had weight loss from 174 >>162 lb but weight starting to creep back up this. He admits to eating at [facility] the other day. Had a burger and fries.    Current Meds  Medication Sig  . amiodarone (PACERONE) 200 MG tablet Take 200 mg by mouth daily.  . furosemide (LASIX) 40 MG tablet Take 40 mg twice per day for 7 Days.  Appointment made 7/14 for further advisement.  . metFORMIN (GLUCOPHAGE) 500 MG tablet Take 500 mg by mouth 2 (two)  times a day.   . Multiple Vitamin (MULTIVITAMIN WITH MINERALS) TABS tablet Take 1 tablet by mouth daily. Centrum Silver  . neomycin-polymyxin-hydrocortisone (CORTISPORIN) 3.5-10000-1 OTIC suspension Place 4 drops into the right ear daily.  . nitroGLYCERIN (NITROSTAT) 0.4 MG SL tablet Place 0.4 mg under the tongue every 5 (five) minutes as needed. For chest pain  . predniSONE (DELTASONE) 10 MG tablet Take 10 mg by mouth daily.   Marland Kitchen PROAIR HFA 108 (90 Base) MCG/ACT inhaler Inhale 2 puffs into the lungs See admin instructions. Inhale 2 puffs by mouth twice daily & every 4 hours as needed for wheezing or shortness of breath.  . rivaroxaban (XARELTO) 20 MG TABS tablet Take 1 tablet (20 mg total) by mouth daily.  . simvastatin (ZOCOR) 40 MG tablet Take 40 mg by mouth every morning.   . tamsulosin (FLOMAX) 0.4 MG CAPS capsule Take 0.4 mg by mouth daily.  Rhea Pink SINUS SEVERE 5-325-200 MG TABS Take 1 tablet by mouth daily as needed (sinus headache).   Allergies  Allergen Reactions  . Tetanus Toxoids Shortness Of Breath and Swelling  . Aspirin Nausea Only and Other (See Comments)    headache  . Penicillins Swelling    Did it involve swelling of the face/tongue/throat, SOB, or low BP? Yes Did it involve sudden or severe rash/hives, skin peeling, or any reaction on the inside of your mouth or nose? Unknown Did you need to seek medical attention at a hospital or doctor's  office? Yes When did it last happen?30 years ago If all above answers are "NO", may proceed with cephalosporin use.    Past Medical History:  Diagnosis Date  . Adenomatous colon polyp 01/2002  . Arthritis   . CKD (chronic kidney disease) stage 3, GFR 30-59 ml/min (HCC)   . Complete heart block ([place])    a. 05/2011 s/p SJM Model [id number] Dual Chamber PPM  . COPD (chronic obstructive pulmonary disease) ([place])   . Diabetes mellitus ([place])   . Diastolic CHF ([place])    a. 01/4780 Echo: EF 55-60%  . DVT (deep venous thrombosis),  unspecified laterality    a. previously on coumadin - d/c'd spring of 2013  . Hyperglycemia   . Hyperlipidemia   . Hypertension   . Inferior myocardial infarction ([place]) 08/22/2013  . Steroid dependence   . Tobacco abuse    Family History  Problem Relation Age of Onset  . Leukemia Mother   . Colon cancer Neg Hx    Past Surgical History:  Procedure Laterality Date  . bilateral inguinal hernia    . CARDIOVERSION N/A 06/16/2018   Procedure: CARDIOVERSION;  Surgeon: Fay Records, MD;  Location: [facility];  Service: Cardiovascular;  Laterality: N/A;  . MELANOMA EXCISION    . PACEMAKER INSERTION  May 2013  . PERMANENT PACEMAKER INSERTION Left 05/20/2011   Procedure: PERMANENT PACEMAKER INSERTION;  Surgeon: Deboraha Sprang, MD;  Location: [facility] CATH LAB;  Service: Cardiovascular;  Laterality: Left;   Social History   Socioeconomic History  . Marital status: Married    Spouse name: Not on file  . Number of children: Not on file  . Years of education: Not on file  . Highest education level: Not on file  Occupational History  . Occupation: retired  Scientific laboratory technician  . Financial resource strain: Not on file  . Food insecurity    Worry: Not on file    Inability: Not on file  . Transportation needs    Medical: Not on file    Non-medical: Not on file  Tobacco Use  . Smoking status: Current Every Day Smoker    Packs/day: 2.00    Types: Cigarettes  . Smokeless tobacco: Never Used  Substance and Sexual Activity  . Alcohol use: No    Alcohol/week: 0.0 standard drinks  . Drug use: No  . Sexual activity: Not Currently  Lifestyle  . Physical activity    Days per week: Not on file    Minutes per session: Not on file  . Stress: Not on file  Relationships  . Social Herbalist on phone: Not on file    Gets together: Not on file    Attends religious service: Not on file    Active member of club or organization: Not on file    Attends meetings of clubs or organizations: Not on  file    Relationship status: Not on file  . Intimate partner violence    Fear of current or ex partner: Not on file    Emotionally abused: Not on file    Physically abused: Not on file    Forced sexual activity: Not on file  Other Topics Concern  . Not on file  Social History Narrative  . Not on file     Lipid Panel  No results found for: CHOL, TRIG, HDL, CHOLHDL, VLDL, LDLCALC, LDLDIRECT  Review of Systems: General: negative for chills, fever, night sweats or weight changes.  Cardiovascular: negative for chest  pain, dyspnea on exertion, edema, orthopnea, palpitations, paroxysmal nocturnal dyspnea or shortness of breath Dermatological: negative for rash Respiratory: negative for cough or wheezing Urologic: negative for hematuria Abdominal: negative for nausea, vomiting, diarrhea, bright red blood per rectum, melena, or hematemesis Neurologic: negative for visual changes, syncope, or dizziness All other systems reviewed and are otherwise negative except as noted above.   Physical Exam:  Blood pressure 120/60, pulse 70, height 5\' 9"  (1.753 m), weight 172 lb (78 kg), SpO2 96 %.  General appearance: elderly WM, alert, cooperative and no distress Neck: no carotid bruit and no JVD Lungs: faint crackles at the bases bilaterally, upper lung fields CTAB Heart: irregularly irregular rhythm and regular rate Extremities: extremities normal, atraumatic, no cyanosis or edema Pulses: 2+ and symmetric Skin: Skin color, texture, turgor normal. No rashes or lesions Neurologic: Grossly normal  EKG not performed -- personally reviewed   ASSESSMENT AND PLAN:   1. Acute on Chronic Diastolic CHF: symptomatic w/ dyspnea. Recent BMP was elevated at 1900 and Lasix increased to 40 mg BID. He had initial good response w/ weight loss from 174>>169, but weight starting to creep back up to 172 lb today, due to high salt diet. Has been eating fast food, [employer]. He notes slight improvement in symptoms  but still mild dyspnea. No CP and no palpitations w/ Afib. HR is controlled. BP stable. No Koichi on exam but faint bibasilar crackles present. Suspect he will need additional lasix.  We will repeat BNP today and will reassess renal function and K. If BNP still elevated and if normal SCr, will further titrate Lasix. I also stressed the importance of dietary sodium restriction and continuation of daily weights.   2. Persistent Atrial Fibrillation: He had DCCV in May for atrial fibrillation. Was initially successful, but atrial fibrillation returned. Per recent remote device transmission 7/31, he has had Persistent AF since 07/21/18. V rates controlled.  Current rate 70 bpm.  He was recently loaded with p.o. amiodarone and was on high-dose but was tapered down to a maintenance dose of 200 mg once daily.  Given his breakthrough persistent atrial fibrillation despite antiarrhythmic drug therapy with amiodarone, may consider discontinuation however I will defer decision to Dr. Caryl Comes who will see him next month.  Despite maintaining normal rhythm, amiodarone may be working to help keep his rate controlled.  I do not think he will be able to tolerate AV nodal blocking agents such as Cardizem and metoprolol due to soft blood pressures in the past.  For now, we will continue low-dose amiodarone 200 mg daily along with Xarelto for anticoagulation. F/u with Dr. Caryl Comes in 3 weeks.    Follow-Up w/ Dr. Caryl Comes in 3 weeks.   Danial Hlavac Ladoris Gene, MHS [facility] 09/01/2018 10:21 AM

## 2018-09-02 LAB — BASIC METABOLIC PANEL
BUN/Creatinine Ratio: 15 (ref 10–24)
BUN: 24 mg/dL (ref 8–27)
CO2: 26 mmol/L (ref 20–29)
Calcium: 9.2 mg/dL (ref 8.6–10.2)
Chloride: 97 mmol/L (ref 96–106)
Creatinine, Ser: 1.6 mg/dL — ABNORMAL HIGH (ref 0.76–1.27)
GFR calc Af Amer: 45 mL/min/{1.73_m2} — ABNORMAL LOW (ref >59)
GFR calc non Af Amer: 39 mL/min/{1.73_m2} — ABNORMAL LOW (ref >59)
Glucose: 164 mg/dL — ABNORMAL HIGH (ref 65–99)
Potassium: 4 mmol/L (ref 3.5–5.2)
Sodium: 139 mmol/L (ref 134–144)

## 2018-09-02 LAB — PRO B NATRIURETIC PEPTIDE: NT-Pro BNP: 1378 pg/mL — ABNORMAL HIGH (ref 0–486)

## 2018-09-07 ENCOUNTER — Telehealth: Payer: Self-pay

## 2018-09-07 NOTE — Telephone Encounter (Signed)
-----   Message from Consuelo Pandy, [place] sent at 09/06/2018  5:55 PM EDT ----- I have reviewed his previous BNPs (fluid markers). They have historically been high, as high as 6,000. Most recent lab value has improved after temporary increase in Lasix and based on age scale, level is not c/w acute CHF. I recommend that he continue with usual dose of lasix, 40 mg once daily. Keep upcoming f/u with Dr. Caryl Comes. Continue to weigh daily and call if > 3 lb weight gain in 24 hr or > 5 lb weight gain in 1 week. Limit salt intake (avoid eating out).

## 2018-09-07 NOTE — Telephone Encounter (Signed)
Notes recorded by Frederik Schmidt, RN on 09/07/2018 at 8:05 AM EDT  The patient's wife has been notified of the result and verbalized understanding. All questions (if any) were answered.  Frederik Schmidt, RN 09/07/2018 8:05 AM   ------

## 2018-09-13 ENCOUNTER — Encounter: Payer: Self-pay | Admitting: Physician Assistant

## 2018-09-21 NOTE — Progress Notes (Signed)
Cardiology Office Note Date:  09/21/2018  Patient ID:  Lance Smith 08-03-1931, MRN [id number] PCP:  Burnard Bunting, MD  Cardiologist:  Dr. Caryl Comes    Chief Complaint:  SOB  History of Present Illness: Lance Smith is a 85 y.o. male with history of CHB w/PPM, CRI (III), COPD, DM, chronic CHF (diastolic), recurrent DVT, AFib, on Xarelto HLD, HTN, CAD.  He comes in today to be seen for Dr. Caryl Comes.  He last saw him via tele-health May 2020, with reports of progressive DOE,  edema, fatigue remote noted persistent Af since Nov 2019 and started on amiodarone (loading included) and planned for DCCV, his diuretic increased DCCV 06/16/2018 restored SR  Unfortunately he has had recurrent symptomatic Afib despite controlled VR.  He saw B. Rosita Fire, [place] 08/21/2018, he had c/o DOE and edema, she did not find exam evidence of edema or fluid OL, his weight was down.  Considered stopping amio though thought this may be aiding in his rate control and unable to add/adjust rate controlling meds with SBP 108.  No overt findings of fluid OL, though lasix adjusted temporarily with pt reports of DOE and weight gain by his home scale. He was seen again 8/14, slight improvement in his DOE but not resolved, lost weight with week of additional lasix though started back up, also noted dietary indulgences with [employer] including fires.... 09/01/2018 K+ 4.0 Creat 1.60 (1.44) BNP 1378 (1907)  Lengthy discussion with the patient today. He dates his SOB back years, not particularly worse in general, and he continues to smoke without plans to quit.  On my exam today he has end exp wheezes b/l and suspect at least a component of his SOB is COPD/emphysema related.  He does not have a pulmonologist.  He has some edema, is 1+ to mid shin, no skin changes and this is as well to some degree at least chronic.  Though he mentions that his weight continues to creep up without the additional diuretics. He tells me he has been  taking his furosemide BID for a month (though directs for this seem to have been only intermittently and am not sure he has complete handle on his meds.  His SOB has a component of orthopnea and PND, nights are worse for him in general, but again, not new.  He does feel bloated and mentions some R sided dull ache (he has no tenderness, and good BS)  He denies any CP, palpitations or cardiac awareness of any kind, no near syncope or syncope.  He repots he falls asleep if he is sitting of any duration.  He denies any bleeding or signs of bleeding. His Creat Cl has decreased, and 20mg  xarelto by most recent labs is high for him. When I discuss this he becomes a bit angry about medicine changes and cost involved, and that he just got 90 days of his xarelto. I am getting BMET today, though discussed with him the importance if his kidney function dictate the decrease in his dose, I have to do that for his safety/bleeding risk..    Device information: SJM dual chamber PPM, implanted 05/20/11   Past Medical History:  Diagnosis Date  . Adenomatous colon polyp 01/2002  . Arthritis   . CKD (chronic kidney disease) stage 3, GFR 30-59 ml/min (HCC)   . Complete heart block ([place])    a. 05/2011 s/p SJM Model [id number] Dual Chamber PPM  . COPD (chronic obstructive pulmonary disease) ([place])   .  Diabetes mellitus ([place])   . Diastolic CHF ([place])    a. [id number] Echo: EF 55-60%  . DVT (deep venous thrombosis), unspecified laterality    a. previously on coumadin - d/c'd spring of 2013  . Hyperglycemia   . Hyperlipidemia   . Hypertension   . Inferior myocardial infarction ([place]) 08/22/2013  . Steroid dependence   . Tobacco abuse     Past Surgical History:  Procedure Laterality Date  . bilateral inguinal hernia    . CARDIOVERSION N/A 06/16/2018   Procedure: CARDIOVERSION;  Surgeon: Fay Records, MD;  Location: [facility];  Service: Cardiovascular;  Laterality: N/A;  . MELANOMA EXCISION    . PACEMAKER  INSERTION  May 2013  . PERMANENT PACEMAKER INSERTION Left 05/20/2011   Procedure: PERMANENT PACEMAKER INSERTION;  Surgeon: Deboraha Sprang, MD;  Location: [facility] CATH LAB;  Service: Cardiovascular;  Laterality: Left;    Current Outpatient Medications  Medication Sig Dispense Refill  . amiodarone (PACERONE) 200 MG tablet Take 200 mg by mouth daily.    . furosemide (LASIX) 40 MG tablet Take 40 mg twice per day for 7 Days.  Appointment made 7/14 for further advisement. 90 tablet 3  . metFORMIN (GLUCOPHAGE) 500 MG tablet Take 500 mg by mouth 2 (two) times a day.     . Multiple Vitamin (MULTIVITAMIN WITH MINERALS) TABS tablet Take 1 tablet by mouth daily. Centrum Silver    . neomycin-polymyxin-hydrocortisone (CORTISPORIN) 3.5-10000-1 OTIC suspension Place 4 drops into the right ear daily.    . nitroGLYCERIN (NITROSTAT) 0.4 MG SL tablet Place 0.4 mg under the tongue every 5 (five) minutes as needed. For chest pain    . predniSONE (DELTASONE) 10 MG tablet Take 10 mg by mouth daily.     Marland Kitchen PROAIR HFA 108 (90 Base) MCG/ACT inhaler Inhale 2 puffs into the lungs See admin instructions. Inhale 2 puffs by mouth twice daily & every 4 hours as needed for wheezing or shortness of breath.    . rivaroxaban (XARELTO) 20 MG TABS tablet Take 1 tablet (20 mg total) by mouth daily. 30 tablet 5  . simvastatin (ZOCOR) 40 MG tablet Take 40 mg by mouth every morning.     . tamsulosin (FLOMAX) 0.4 MG CAPS capsule Take 0.4 mg by mouth daily.    Rhea Pink SINUS SEVERE 5-325-200 MG TABS Take 1 tablet by mouth daily as needed (sinus headache).     No current facility-administered medications for this visit.     Allergies:   Tetanus toxoids, Aspirin, and Penicillins   Social History:  The patient  reports that he has been smoking cigarettes. He has been smoking about 2.00 packs per day. He has never used smokeless tobacco. He reports that he does not drink alcohol or use drugs.   Family History:  The patient's family history  includes Leukemia in his mother.  ROS:  Please see the history of present illness.  All other systems are reviewed and otherwise negative.   PHYSICAL EXAM:  VS:  There were no vitals taken for this visit. BMI: There is no height or weight on file to calculate BMI. Well nourished, well developed, in no acute distress  HEENT: normocephalic, atraumatic  Neck: no JVD, carotid bruits or masses Cardiac:  RRR (paced); 1/6 SM, no rubs, or gallops Lungs:  CTA b/l, end exp wheezes are noted b/l, I do not appreciate rhonchi or rales  Abd: soft, nontender, obese, normal BS throughout MS: no deformity, age appropriate atrophy Ext: trace-1+ edema  Skin: warm and dry, no rash Neuro:  No gross deficits appreciated Psych: euthymic mood, full affect  PPM site is stable, no tethering or discomfort   EKG:  Not done today PPM interrogation done today and reviewed bymyself: battery and lead measurements are good, Autocapure management test ran (given it had been a while) and did not recommend auto capture.  RV lead output programmed fixed 2.5 V. His AFib burden is >99% He was in AFib from Nov 2019 to May 2020, briefly in [place]  And has been back in AFib since He is device dependent, has some high V rates that are VP prior to [facility]   10/08/16: TTE Study Conclusions - Left ventricle: The cavity size was normal. There was moderate   concentric and severe asymmetric hypertrophy of the mid and   distal septum as well as the mid and apical inferior wall and   apex. Consider cardiac MRI to rule out HOCM. Systolic function   was normal. The estimated ejection fraction was in the range of   55% to 60%. Wall motion was normal; there were no regional wall   motion abnormalities. Features are consistent with a pseudonormal   left ventricular filling pattern, with concomitant abnormal   relaxation and increased filling pressure (grade 2 diastolic   dysfunction). Doppler parameters are consistent with high    ventricular filling pressure. - Aortic valve: Trileaflet; mildly thickened, mildly calcified   leaflets. There was trivial regurgitation. - Mitral valve: Calcified annulus. - Left atrium: The atrium was mildly dilated. - Impressions: Normal LVF with EF 55-60% with moderate concentric   hypertrophy and severe asymmetric hypertrophy of the mid and   distal septum, mid and distal inferior wall and apex. Consider   cardiac MRI to assess for HOCM variant. Impressions: - Normal LVF with EF 55-60% with moderate concentric hypertrophy   and severe asymmetric hypertrophy of the mid and distal septum,   mid and distal inferior wall and apex. Consider cardiac MRI to   assess for HOCM variant.  06/26/13: stress myoview Overall Impression:  Low risk stress nuclear study with a fixed, medium-sized moderate basal inferior and inferoseptal perfusion defect and a fixed small, mild mid inferolateral perfusion defect.  There was no ischemia (SDS 1).  Given relatively normal wall motion in the areas of the defects, cannot rule out attenuation. . LV Ejection Fraction: 51%.  LV Wall Motion:  Apical hypokinesis  Recent Labs: 06/13/2018: ALT 14; TSH 2.310 08/21/2018: Hemoglobin 14.4; Platelets 319 09/01/2018: BUN 24; Creatinine, Ser 1.60; NT-Pro BNP 1,378; Potassium 4.0; Sodium 139  No results found for requested labs within last 8760 hours.   CrCl cannot be calculated (Unknown ideal weight.).   Wt Readings from Last 3 Encounters:  09/01/18 172 lb (78 kg)  08/21/18 174 lb 9.6 oz (79.2 kg)  06/16/18 174 lb (78.9 kg)     Other studies reviewed: Additional studies/records reviewed today include: summarized above  ASSESSMENT AND PLAN:  1. PPM     Intact function,  Reprogrammed as noted above  2. Chronic CHF (diastolic) 3. Mod concentric LVH, with asymmetrical hypertrophy as well     Creat is creeping up some, he rports he has been taking his furosemide BID all along  I think he has a component of COPD to  his SOB, and he reportts his SOB as essentially unchanged for years AFib could be contributing, he does not recall feeling any significant change or improvement after his DCCVT (noting he had ERAF) Will  try lasix 40mg  daily (only) and add spironolactone 12.5mg  daily BMET today and in 2 weeks  Encouraged to avoid salty foods  3. HTN     Looks ok  4. Recurrent DVT,  5. Persistant AFib/flutter     On Xarelto     His creat is up, going back, CrCl at best was 50, the patient upset about having to change his dose, I discussed at length the importance of being on the right dose.     I agreed we would see what his lab looked like today andif again, indacted he should be on a lower dose I would have no choice to but to reduce it.   For now, continue amiodarone I am not sure how much rhythm control plays into his volume OL yet I will review with dr. Caryl Comes, might consider re-load and try DCCV if felt reasonable Will update his echo, adjust his diuretics, get a cxr follow his labs 1st  6. CAD     Unclear to what extent this is, he reports years ago being told he had has a heart attack, denies any coronary interventions      On statin, reports lipids are monitored and managed with his PMD      No anginal sounding symptoms             Disposition:  As above, see him back in 3-4 weeks, sooner if needed, pending results.    Current medicines are reviewed at length with the patient today.  The patient did not have any concerns regarding medicines.  Venetia Night, PA-C 09/21/2018 7:31 AM     [address]  [place] [address] [phone] (office)  [phone] (fax)

## 2018-09-22 ENCOUNTER — Ambulatory Visit (INDEPENDENT_AMBULATORY_CARE_PROVIDER_SITE_OTHER): Payer: Medicare Other | Admitting: Physician Assistant

## 2018-09-22 ENCOUNTER — Other Ambulatory Visit: Payer: Self-pay

## 2018-09-22 ENCOUNTER — Encounter: Payer: Self-pay | Admitting: Physician Assistant

## 2018-09-22 VITALS — BP 116/68 | HR 74 | Ht 69.0 in | Wt 173.0 lb

## 2018-09-22 DIAGNOSIS — I5030 Unspecified diastolic (congestive) heart failure: Secondary | ICD-10-CM | POA: Diagnosis not present

## 2018-09-22 DIAGNOSIS — I2510 Atherosclerotic heart disease of native coronary artery without angina pectoris: Secondary | ICD-10-CM | POA: Diagnosis not present

## 2018-09-22 DIAGNOSIS — I4819 Other persistent atrial fibrillation: Secondary | ICD-10-CM

## 2018-09-22 DIAGNOSIS — I480 Paroxysmal atrial fibrillation: Secondary | ICD-10-CM | POA: Diagnosis not present

## 2018-09-22 DIAGNOSIS — I10 Essential (primary) hypertension: Secondary | ICD-10-CM

## 2018-09-22 DIAGNOSIS — I5033 Acute on chronic diastolic (congestive) heart failure: Secondary | ICD-10-CM

## 2018-09-22 LAB — BASIC METABOLIC PANEL
BUN/Creatinine Ratio: 16 (ref 10–24)
BUN: 24 mg/dL (ref 8–27)
CO2: 25 mmol/L (ref 20–29)
Calcium: 9.1 mg/dL (ref 8.6–10.2)
Chloride: 98 mmol/L (ref 96–106)
Creatinine, Ser: 1.46 mg/dL — ABNORMAL HIGH (ref 0.76–1.27)
GFR calc Af Amer: 50 mL/min/{1.73_m2} — ABNORMAL LOW (ref >59)
GFR calc non Af Amer: 43 mL/min/{1.73_m2} — ABNORMAL LOW (ref >59)
Glucose: 190 mg/dL — ABNORMAL HIGH (ref 65–99)
Potassium: 4 mmol/L (ref 3.5–5.2)
Sodium: 143 mmol/L (ref 134–144)

## 2018-09-22 MED ORDER — SPIRONOLACTONE 25 MG PO TABS: 12.5000 mg | ORAL_TABLET | Freq: Every day | ORAL | 1 refills | Status: DC

## 2018-09-22 NOTE — Patient Instructions (Addendum)
Medication Instructions:   START TAKING ALDACTONE 12.5 MG ONCE A DAY   If you need a refill on your cardiac medications before your next appointment, please call your pharmacy.   Lab work:  BMET TODAY   BMET NEXT WEEK   If you have labs (blood work) drawn today and your tests are completely normal, you will receive your results only by: Marland Kitchen MyChart Message (if you have MyChart) OR . A paper copy in the mail If you have any lab test that is abnormal or we need to change your treatment, we will call you to review the results.  Testing/Procedures: Your physician has requested that you have an echocardiogram. Echocardiography is a painless test that uses sound waves to create images of your heart. It provides your doctor with information about the size and shape of your heart and how well your heart's chambers and valves are working. This procedure takes approximately one hour. There are no restrictions for this procedure.  A chest x-ray takes a picture of the organs and structures inside the chest, including the heart, lungs, and blood vessels. This test can show several things, including, whether the heart is enlarges; whether fluid is building up in the lungs; and whether pacemaker / defibrillator leads are still in place.  ADDRESS FOR CHEST XRAY :[address], [place], [place] [address]  Hours: Open ?Closes 5PM  Phone: [phone]  Follow-Up: At [facility], you and your health needs are our priority.  As part of our continuing mission to provide you with exceptional heart care, we have created designated Provider Care Teams.  These Care Teams include your primary Cardiologist (physician) and Advanced Practice Providers (APPs -  Physician Assistants and Nurse Practitioners) who all work together to provide you with the care you need, when you need it. You will need a follow up appointment in 3-4  weeks.  Please call our office 2 months in advance to schedule this appointment. You may see  Dr. Caryl Comes or one of the following Advanced Practice Providers on your designated Care Team:   Chanetta Marshall, NP . Tommye Standard, PA-C  Any Other Special Instructions Will Be Listed Below (If Applicable).

## 2018-09-26 NOTE — Addendum Note (Signed)
Addended by: Claude Manges on: 09/26/2018 08:18 AM   Modules accepted: Orders

## 2018-09-27 ENCOUNTER — Other Ambulatory Visit: Payer: Self-pay | Admitting: *Deleted

## 2018-09-27 ENCOUNTER — Other Ambulatory Visit: Payer: Self-pay

## 2018-09-27 ENCOUNTER — Ambulatory Visit
Admission: RE | Admit: 2018-09-27 | Discharge: 2018-09-27 | Disposition: A | Discharge status: Home / Self Care | Payer: Medicare Other | Source: Ambulatory Visit | Attending: Physician Assistant | Admitting: Physician Assistant

## 2018-09-27 DIAGNOSIS — I5033 Acute on chronic diastolic (congestive) heart failure: Secondary | ICD-10-CM

## 2018-09-27 DIAGNOSIS — I4819 Other persistent atrial fibrillation: Secondary | ICD-10-CM

## 2018-09-27 MED ORDER — RIVAROXABAN 15 MG PO TABS: 15.0000 mg | ORAL_TABLET | Freq: Every day | ORAL | 3 refills | Status: DC

## 2018-09-29 ENCOUNTER — Other Ambulatory Visit: Payer: Self-pay

## 2018-09-29 ENCOUNTER — Other Ambulatory Visit: Payer: Medicare Other | Admitting: *Deleted

## 2018-09-29 ENCOUNTER — Ambulatory Visit (HOSPITAL_COMMUNITY): Payer: Medicare Other | Attending: Cardiology

## 2018-09-29 DIAGNOSIS — I5033 Acute on chronic diastolic (congestive) heart failure: Secondary | ICD-10-CM | POA: Insufficient documentation

## 2018-09-29 DIAGNOSIS — I4819 Other persistent atrial fibrillation: Secondary | ICD-10-CM | POA: Insufficient documentation

## 2018-09-30 LAB — BASIC METABOLIC PANEL
BUN/Creatinine Ratio: 15 (ref 10–24)
BUN: 23 mg/dL (ref 8–27)
CO2: 24 mmol/L (ref 20–29)
Calcium: 9.2 mg/dL (ref 8.6–10.2)
Chloride: 101 mmol/L (ref 96–106)
Creatinine, Ser: 1.53 mg/dL — ABNORMAL HIGH (ref 0.76–1.27)
GFR calc Af Amer: 47 mL/min/{1.73_m2} — ABNORMAL LOW (ref >59)
GFR calc non Af Amer: 41 mL/min/{1.73_m2} — ABNORMAL LOW (ref >59)
Glucose: 176 mg/dL — ABNORMAL HIGH (ref 65–99)
Potassium: 5 mmol/L (ref 3.5–5.2)
Sodium: 141 mmol/L (ref 134–144)

## 2018-10-02 ENCOUNTER — Encounter: Payer: Self-pay | Admitting: *Deleted

## 2018-10-02 DIAGNOSIS — Z79899 Other long term (current) drug therapy: Secondary | ICD-10-CM

## 2018-10-03 ENCOUNTER — Encounter: Payer: Medicare Other | Admitting: Internal Medicine

## 2018-10-03 ENCOUNTER — Telehealth: Payer: Self-pay | Admitting: *Deleted

## 2018-10-03 NOTE — Telephone Encounter (Signed)
Patient came into the office for appointment with Dr. Caryl Comes, appointment was cancelled and he stated that "he had not been called". He was very upset. I apologized to him for the inconvenience. I wrote down for him his appointment for lab and visit scheduled with Benjaman Pott on Wed. 10/18/18 with appointment times.

## 2018-10-18 ENCOUNTER — Other Ambulatory Visit: Payer: Medicare Other

## 2018-10-18 ENCOUNTER — Encounter: Payer: Medicare Other | Admitting: Nurse Practitioner

## 2018-10-19 NOTE — Progress Notes (Signed)
Electrophysiology Office Note Date: 10/20/2018  ID:  Ruy, Dunkelberger [birth year], MRN [id number]  PCP: Burnard Bunting, MD Primary Cardiologist: No primary care provider on file. Electrophysiologist: Dr. Caryl Comes.  CC: Pacemaker follow-up  Lance Smith is a 85 y.o. male with history of CHB s/p PPM, CKD III, COPD, DM, Chronic diastolic CHF, recurrent DVT, Afib on Xarelto, HLD, HTN, and CAD. He is seen today for Dr. Caryl Comes. he presents today for routine electrophysiology followup.  Last visit lasix was increased and started on spironolactone.  Since last being seen in our clinic, the patient reports doing about the same.  He remains occasionally SOB with ADLs. He denies orthopnea or PND. He denies chest pain, palpitations, nausea, vomiting, dizziness, syncope, weight gain, or early satiety. He has gained about 3 lbs in the past 2-3 days.  He drinks 60 oz or more most days. He feels like his UOP on lasix has leveled off.   Echo 09/29/2018 LVEF 55-60%.  Device History: St. Jude Dual Chamber PPM implanted 05/2011 for CHB  Past Medical History:  Diagnosis Date  . Adenomatous colon polyp 01/2002  . Arthritis   . CKD (chronic kidney disease) stage 3, GFR 30-59 ml/min   . Complete heart block ([place])    a. 05/2011 s/p SJM Model [id number] Dual Chamber PPM  . COPD (chronic obstructive pulmonary disease) ([place])   . Diabetes mellitus ([place])   . Diastolic CHF ([place])    a. [id number] Echo: EF 55-60%  . DVT (deep venous thrombosis), unspecified laterality    a. previously on coumadin - d/c'd spring of 2013  . Hyperglycemia   . Hyperlipidemia   . Hypertension   . Inferior myocardial infarction ([place]) 08/22/2013  . Steroid dependence ([place])   . Tobacco abuse    Past Surgical History:  Procedure Laterality Date  . bilateral inguinal hernia    . CARDIOVERSION N/A 06/16/2018   Procedure: CARDIOVERSION;  Surgeon: Fay Records, MD;  Location: [place];  Service: Cardiovascular;  Laterality: N/A;  . MELANOMA  EXCISION    . PACEMAKER INSERTION  May 2013  . PERMANENT PACEMAKER INSERTION Left 05/20/2011   Procedure: PERMANENT PACEMAKER INSERTION;  Surgeon: Deboraha Sprang, MD;  Location: [facility] CATH LAB;  Service: Cardiovascular;  Laterality: Left;    Current Outpatient Medications  Medication Sig Dispense Refill  . amiodarone (PACERONE) 200 MG tablet Take 200 mg by mouth daily.    . furosemide (LASIX) 40 MG tablet Take 40 mg by mouth daily.    . metFORMIN (GLUCOPHAGE) 500 MG tablet Take 500 mg by mouth daily with breakfast.     . Multiple Vitamin (MULTIVITAMIN WITH MINERALS) TABS tablet Take 1 tablet by mouth daily. Centrum Silver    . neomycin-polymyxin-hydrocortisone (CORTISPORIN) 3.5-10000-1 OTIC suspension Place 4 drops into the right ear daily.    . nitroGLYCERIN (NITROSTAT) 0.4 MG SL tablet Place 0.4 mg under the tongue every 5 (five) minutes as needed. For chest pain    . predniSONE (DELTASONE) 10 MG tablet Take 10 mg by mouth daily.     Marland Kitchen PROAIR HFA 108 (90 Base) MCG/ACT inhaler Inhale 2 puffs into the lungs See admin instructions. Inhale 2 puffs by mouth twice daily & every 4 hours as needed for wheezing or shortness of breath.    . Rivaroxaban (XARELTO) 15 MG TABS tablet Take 1 tablet (15 mg total) by mouth daily. 90 tablet 3  . simvastatin (ZOCOR) 40 MG tablet Take 40 mg by mouth every morning.     Marland Kitchen  spironolactone (ALDACTONE) 25 MG tablet Take 0.5 tablets (12.5 mg total) by mouth daily. 90 tablet 1  . tamsulosin (FLOMAX) 0.4 MG CAPS capsule Take 0.4 mg by mouth daily.    Rhea Pink SINUS SEVERE 5-325-200 MG TABS Take 1 tablet by mouth daily as needed (sinus headache).     No current facility-administered medications for this visit.     Allergies:   Tetanus toxoids, Aspirin, and Penicillins   Social History: Social History   Socioeconomic History  . Marital status: Married    Spouse name: Not on file  . Number of children: Not on file  . Years of education: Not on file  . Highest  education level: Not on file  Occupational History  . Occupation: retired  Scientific laboratory technician  . Financial resource strain: Not on file  . Food insecurity    Worry: Not on file    Inability: Not on file  . Transportation needs    Medical: Not on file    Non-medical: Not on file  Tobacco Use  . Smoking status: Current Every Day Smoker    Packs/day: 2.00    Types: Cigarettes  . Smokeless tobacco: Never Used  Substance and Sexual Activity  . Alcohol use: No    Alcohol/week: 0.0 standard drinks  . Drug use: No  . Sexual activity: Not Currently  Lifestyle  . Physical activity    Days per week: Not on file    Minutes per session: Not on file  . Stress: Not on file  Relationships  . Social Herbalist on phone: Not on file    Gets together: Not on file    Attends religious service: Not on file    Active member of club or organization: Not on file    Attends meetings of clubs or organizations: Not on file    Relationship status: Not on file  . Intimate partner violence    Fear of current or ex partner: Not on file    Emotionally abused: Not on file    Physically abused: Not on file    Forced sexual activity: Not on file  Other Topics Concern  . Not on file  Social History Narrative  . Not on file    Family History: Family History  Problem Relation Age of Onset  . Leukemia Mother   . Colon cancer Neg Hx      Review of Systems: All other systems reviewed and are otherwise negative except as noted above.  Physical Exam: Vitals:   10/20/18 1019  BP: 126/72  Pulse: 69  SpO2: 97%  Weight: 174 lb 9.6 oz (79.2 kg)  Height: 5\' 9"  (1.753 m)     GEN- The patient is well appearing, alert and oriented x 3 today.   HEENT: normocephalic, atraumatic; sclera clear, conjunctiva pink; hearing intact; oropharynx clear; neck supple  Lungs- Clear to ausculation bilaterally, normal work of breathing.  No wheezes, rales, rhonchi Heart- Regular rate and rhythm, no murmurs, rubs  or gallops  GI- soft, non-tender, non-distended, bowel sounds present  Extremities- no clubbing, cyanosis, or edema  MS- no significant deformity or atrophy Skin- warm and dry, no rash or lesion; PPM pocket well healed Psych- euthymic mood, full affect Neuro- strength and sensation are intact  PPM Interrogation- reviewed in detail today,  See PACEART report  EKG:  EKG is not ordered today. The ekg ordered 08/21/2018 shows Aflutter 70 bpm. Aflutter by device interrogation.   Recent Labs: 06/13/2018: ALT 14;  TSH 2.310 08/21/2018: Hemoglobin 14.4; Platelets 319 09/01/2018: NT-Pro BNP 1,378 09/29/2018: BUN 23; Creatinine, Ser 1.53; Potassium 5.0; Sodium 141   Wt Readings from Last 3 Encounters:  10/20/18 174 lb 9.6 oz (79.2 kg)  09/22/18 173 lb (78.5 kg)  09/01/18 172 lb (78 kg)     Other studies Reviewed: Additional studies/ records that were reviewed today include: Echo 09/29/2018 shows LVEF 55/60%, Previous EP office notes, Previous remote checks, Most recent labwork.   Assessment and Plan:  1.  CHB s/p St. Jude PPM  Normal PPM function See Pace Art report No changes today  2. Chronic diastolic CHF He is not markedly volume overloaded on exam. Trace to 1+ ankle to mid calf edema, JVP ~7-8 cm.  I have instructed him to take an extra 40 mg of lasix this evening, but do not think his current volume calls for a chronic increase.  Continue spiro 12.5 mg daily. K borderline high last check. BMET pending today.   3. HTN Continue current regimen  4. H/o recurrent DVTs On Xarelto for Afib  5. Persistent Afib/Flutter On dose adjusted Xarelto at 15 mg daily.   6. H/o CAD per patient Denies ischemic symptoms.   7. Suspect sleep apnea Pt with daytime fatigue and other risk factors for OSA. We have recommended he get a sleep study several times, back to 2015.  He continues to refuse sleep study consideration.  Encouraged weight loss due to central obesity.   Current medicines are  reviewed at length with the patient today.   The patient does not have concerns regarding his medicines.  The following changes were made today:  Continue sliding scale diuretics.   Labs/ tests ordered today include:  No orders of the defined types were placed in this encounter. (BMET previously ordered for a separate encounter)   Disposition:   Follow up with Dr. Caryl Comes in 4 months, sooner with change in symptoms.   Lance Lefevre, PA-C  10/20/2018 11:22 AM  [facility] [address] [place] [address] [phone] (office) [phone] (fax)

## 2018-10-20 ENCOUNTER — Other Ambulatory Visit: Payer: Self-pay

## 2018-10-20 ENCOUNTER — Other Ambulatory Visit: Payer: Medicare Other | Admitting: *Deleted

## 2018-10-20 ENCOUNTER — Ambulatory Visit (INDEPENDENT_AMBULATORY_CARE_PROVIDER_SITE_OTHER): Payer: Medicare Other | Admitting: Student

## 2018-10-20 ENCOUNTER — Encounter: Payer: Self-pay | Admitting: Student

## 2018-10-20 VITALS — BP 126/72 | HR 69 | Ht 69.0 in | Wt 174.6 lb

## 2018-10-20 DIAGNOSIS — I5033 Acute on chronic diastolic (congestive) heart failure: Secondary | ICD-10-CM | POA: Diagnosis not present

## 2018-10-20 DIAGNOSIS — I442 Atrioventricular block, complete: Secondary | ICD-10-CM | POA: Diagnosis not present

## 2018-10-20 DIAGNOSIS — I4819 Other persistent atrial fibrillation: Secondary | ICD-10-CM | POA: Diagnosis not present

## 2018-10-20 DIAGNOSIS — I10 Essential (primary) hypertension: Secondary | ICD-10-CM

## 2018-10-20 DIAGNOSIS — Z79899 Other long term (current) drug therapy: Secondary | ICD-10-CM

## 2018-10-20 LAB — CUP PACEART INCLINIC DEVICE CHECK
Battery Remaining Longevity: 46 mo
Battery Voltage: 2.84 V
Brady Statistic RA Percent Paced: 0 %
Brady Statistic RV Percent Paced: 99.96 %
Date Time Interrogation Session: 20201002122728
Implantable Lead Implant Date: 20130502
Implantable Lead Implant Date: 20130502
Implantable Lead Location: 753859
Implantable Lead Location: 753860
Implantable Pulse Generator Implant Date: 20130502
Lead Channel Impedance Value: 450 Ohm
Lead Channel Impedance Value: 475 Ohm
Lead Channel Pacing Threshold Amplitude: 0.75 V
Lead Channel Pacing Threshold Amplitude: 0.75 V
Lead Channel Pacing Threshold Pulse Width: 0.5 ms
Lead Channel Pacing Threshold Pulse Width: 0.5 ms
Lead Channel Sensing Intrinsic Amplitude: 12 mV
Lead Channel Sensing Intrinsic Amplitude: 4.3 mV
Lead Channel Setting Pacing Amplitude: 2 V
Lead Channel Setting Pacing Amplitude: 2.5 V
Lead Channel Setting Pacing Pulse Width: 0.5 ms
Lead Channel Setting Sensing Sensitivity: 4 mV
Pulse Gen Model: 2110
Pulse Gen Serial Number: 7321242

## 2018-10-20 MED ORDER — RIVAROXABAN 15 MG PO TABS: 15.0000 mg | ORAL_TABLET | Freq: Every day | ORAL | 3 refills | Status: AC

## 2018-10-20 NOTE — Patient Instructions (Signed)
Medication Instructions:  none If you need a refill on your cardiac medications before your next appointment, please call your pharmacy.   Lab work:TODAY BMET If you have labs (blood work) drawn today and your tests are completely normal, you will receive your results only by: Marland Kitchen MyChart Message (if you have MyChart) OR . A paper copy in the mail If you have any lab test that is abnormal or we need to change your treatment, we will call you to review the results.  Testing/Procedures: none  Follow-Up: Dr Caryl Comes in 4 months At [facility], you and your health needs are our priority.  As part of our continuing mission to provide you with exceptional heart care, we have created designated Provider Care Teams.  These Care Teams include your primary Cardiologist (physician) and Advanced Practice Providers (APPs -  Physician Assistants and Nurse Practitioners) who all work together to provide you with the care you need, when you need it. .   Any Other Special Instructions Will Be Listed Below (If Applicable). Remote monitoring is used to monitor your Pacemaker  from home. This monitoring reduces the number of office visits required to check your device to one time per year. It allows [place] to keep an eye on the functioning of your device to ensure it is working properly. You are scheduled for a device check from home on 10/22. You may send your transmission at any time that day. If you have a wireless device, the transmission will be sent automatically. After your physician reviews your transmission, you will receive a postcard with your next transmission date.

## 2018-10-21 LAB — BASIC METABOLIC PANEL
BUN/Creatinine Ratio: 19 (ref 10–24)
BUN: 25 mg/dL (ref 8–27)
CO2: 22 mmol/L (ref 20–29)
Calcium: 8.9 mg/dL (ref 8.6–10.2)
Chloride: 104 mmol/L (ref 96–106)
Creatinine, Ser: 1.35 mg/dL — ABNORMAL HIGH (ref 0.76–1.27)
GFR calc Af Amer: 55 mL/min/{1.73_m2} — ABNORMAL LOW (ref >59)
GFR calc non Af Amer: 48 mL/min/{1.73_m2} — ABNORMAL LOW (ref >59)
Glucose: 190 mg/dL — ABNORMAL HIGH (ref 65–99)
Potassium: 4.4 mmol/L (ref 3.5–5.2)
Sodium: 140 mmol/L (ref 134–144)

## 2018-11-09 ENCOUNTER — Ambulatory Visit (INDEPENDENT_AMBULATORY_CARE_PROVIDER_SITE_OTHER): Payer: Medicare Other | Admitting: *Deleted

## 2018-11-09 DIAGNOSIS — I4891 Unspecified atrial fibrillation: Secondary | ICD-10-CM

## 2018-11-09 DIAGNOSIS — I442 Atrioventricular block, complete: Secondary | ICD-10-CM

## 2018-11-13 LAB — CUP PACEART REMOTE DEVICE CHECK
Battery Remaining Longevity: 45 mo
Battery Remaining Percentage: 40 %
Battery Voltage: 2.84 V
Brady Statistic AP VP Percent: 0 %
Brady Statistic AP VS Percent: 0 %
Brady Statistic AS VP Percent: 100 %
Brady Statistic AS VS Percent: 0 %
Brady Statistic RA Percent Paced: 1 %
Brady Statistic RV Percent Paced: 99 %
Date Time Interrogation Session: 20201026121726
Implantable Lead Implant Date: 20130502
Implantable Lead Implant Date: 20130502
Implantable Lead Location: 753859
Implantable Lead Location: 753860
Implantable Pulse Generator Implant Date: 20130502
Lead Channel Impedance Value: 400 Ohm
Lead Channel Impedance Value: 480 Ohm
Lead Channel Pacing Threshold Amplitude: 0.75 V
Lead Channel Pacing Threshold Amplitude: 0.75 V
Lead Channel Pacing Threshold Pulse Width: 0.5 ms
Lead Channel Pacing Threshold Pulse Width: 0.5 ms
Lead Channel Sensing Intrinsic Amplitude: 12 mV
Lead Channel Sensing Intrinsic Amplitude: 4.3 mV
Lead Channel Setting Pacing Amplitude: 2 V
Lead Channel Setting Pacing Amplitude: 2.5 V
Lead Channel Setting Pacing Pulse Width: 0.5 ms
Lead Channel Setting Sensing Sensitivity: 4 mV
Pulse Gen Model: 2110
Pulse Gen Serial Number: 7321242

## 2018-11-16 ENCOUNTER — Telehealth: Payer: Self-pay | Admitting: Internal Medicine

## 2018-11-16 NOTE — Telephone Encounter (Signed)
Discussed with patient and his wife personally.   More swelling in legs over past few days, similar to the last time he took extra lasix, which relieved it.   Re: knots. Wife says pt has "numerous" small knots on both legs.  They are slightly raised, they are not red, painful, or increasing in size.  Pt has one that is bruised but says it is where the "dog jumped up" on him.   She feels like she only noticed them this morning.   Pt has NOT missed any doses of Xarelto.  Encouraged pt to take extra lasix as directed and keep feet elevated. Encouraged PCP follow up but wife states they are not seeing anyone in person at this time.   I instructed him to go be checked out at an urgent care if he has significant leg pain or changes in the knots, both of which he denies at this time.   Otherwise, can we try and see him in the office with APP next week? Any APP OK, as most likely an issue from his chronic diastolic CHF, and not necessarily EP related.   Thank you   Beryle Beams" [place], PA-C  11/16/2018 11:22 AM

## 2018-11-16 NOTE — Telephone Encounter (Signed)
I spoke to the patient's wife Deneise Lever) and she is calling to inform us that the patient has developed bilateral swelling in his legs with knots developing.  He is SOB, but she contributes this to COPD.    He has also gained 4 lbs in 2-3 days.  He is taking Lasix 40 mg daily and I instructed him to take another 40 mg and closely monitor symptoms.  She said that he does not mention CP, but he never "says much anyway."  His last OV was 10/2 and a f/u was to be in 4 months, unless symptoms worsen.  Please advise, thank you

## 2018-11-16 NOTE — Telephone Encounter (Signed)
New message   Pt c/o swelling: STAT is pt has developed SOB within 24 hours  1) How much weight have you gained and in what time span?patient has gained weight per patient's wife 4lb in about 3 to 4 days   2) If swelling, where is the swelling located? Swelling in both legs   3) Are you currently taking a fluid pill? Yes   4) Are you currently SOB?yes   5) Do you have a log of your daily weights (if so, list)?no   6) Have you gained 3 pounds in a day or 5 pounds in a week? Yes   7) Have you traveled recently?no

## 2018-11-20 NOTE — Progress Notes (Signed)
CARDIOLOGY OFFICE NOTE  Date:  11/21/2018    Lance Smith Date of Birth: 1931-12-28 Medical Record [id number]  PCP:  Burnard Bunting, MD  Cardiologist:  Caryl Comes  Chief Complaint  Patient presents with  . Follow-up    Seen for Dr. Caryl Comes    History of Present Illness: Lance Smith is a 85 y.o. male who presents today for a follow up visit. Seen for Dr. Caryl Comes.   He has a history of CHB s/p PPM, CKD III, COPD, DM, chronic diastolic CHF, recurrent DVT, persistent Afib on Xarelto, HLD, HTN, and CAD.   He had a telehealth visit back in May with Dr. Caryl Comes. Towards the end of May noted to have AF and had cardioversion. He had short lived results - managed now with rate control and continued anticoagulation. He has had worsening edema - has required additional diuretics and the addition of Aldactone.   Last seen in October by Jonni Sanger.    The patient does not have symptoms concerning for COVID-19 infection (fever, chills, cough, or new shortness of breath).   Comes in today. Here alone. He is using less salt and his swelling is better. Noted that the patient has a handicap son (75 year old non verbal with Down's) was hospitalized for 14 days last month - his wife stayed in the hospital with the son for the entire time and the patient ate too much fast food with excess salt during this time. There is apparently a daughter that lives with them but he tells me "she does not help". There is a Education officer, museum for the son and they have had a nurse come out once. The son will be needing another hospitalization soon.   Lance Smith says his breathing is stable - he has chronic shortness of breath. No real chest pain. No bleeding/bruising. Swelling has improved. His weight is down a few pounds.   Past Medical History:  Diagnosis Date  . Adenomatous colon polyp 01/2002  . Arthritis   . CKD (chronic kidney disease) stage 3, GFR 30-59 ml/min   . Complete heart block ([place])    a. 05/2011 s/p SJM Model  [id number] Dual Chamber PPM  . COPD (chronic obstructive pulmonary disease) ([place])   . Diabetes mellitus ([place])   . Diastolic CHF ([place])    a. [id number] Echo: EF 55-60%  . DVT (deep venous thrombosis), unspecified laterality    a. previously on coumadin - d/c'd spring of 2013  . Hyperglycemia   . Hyperlipidemia   . Hypertension   . Inferior myocardial infarction ([place]) 08/22/2013  . Steroid dependence ([place])   . Tobacco abuse     Past Surgical History:  Procedure Laterality Date  . bilateral inguinal hernia    . CARDIOVERSION N/A 06/16/2018   Procedure: CARDIOVERSION;  Surgeon: Fay Records, MD;  Location: [place];  Service: Cardiovascular;  Laterality: N/A;  . MELANOMA EXCISION    . PACEMAKER INSERTION  May 2013  . PERMANENT PACEMAKER INSERTION Left 05/20/2011   Procedure: PERMANENT PACEMAKER INSERTION;  Surgeon: Deboraha Sprang, MD;  [facility] C[facility] LAB;  Service: Cardiovascular;  Laterality: Left;     Medications: Current Meds  Medication Sig  . amiodarone (PACERONE) 200 MG tablet Take 200 mg by mouth daily.  . furosemide (LASIX) 40 MG tablet Take 40 mg by mouth daily.  . metFORMIN (GLUCOPHAGE) 500 MG tablet Take 500 mg by mouth daily with breakfast.   . Multiple Vitamin (MULTIVITAMIN WITH MINERALS) TABS  tablet Take 1 tablet by mouth daily. Centrum Silver  . neomycin-polymyxin-hydrocortisone (CORTISPORIN) 3.5-10000-1 OTIC suspension Place 4 drops into the right ear daily.  . nitroGLYCERIN (NITROSTAT) 0.4 MG SL tablet Place 0.4 mg under the tongue every 5 (five) minutes as needed. For chest pain  . predniSONE (DELTASONE) 10 MG tablet Take 10 mg by mouth daily.   Marland Kitchen PROAIR HFA 108 (90 Base) MCG/ACT inhaler Inhale 2 puffs into the lungs See admin instructions. Inhale 2 puffs by mouth twice daily & every 4 hours as needed for wheezing or shortness of breath.  . Rivaroxaban (XARELTO) 15 MG TABS tablet Take 1 tablet (15 mg total) by mouth daily.  . simvastatin (ZOCOR) 40 MG tablet Take 40  mg by mouth every morning.   Marland Kitchen spironolactone (ALDACTONE) 25 MG tablet Take 0.5 tablets (12.5 mg total) by mouth daily.  . tamsulosin (FLOMAX) 0.4 MG CAPS capsule Take 0.4 mg by mouth daily.  Rhea Pink SINUS SEVERE 5-325-200 MG TABS Take 1 tablet by mouth daily as needed (sinus headache).     Allergies: Allergies  Allergen Reactions  . Tetanus Toxoids Shortness Of Breath and Swelling  . Aspirin Nausea Only and Other (See Comments)    headache  . Penicillins Swelling    Did it involve swelling of the face/tongue/throat, SOB, or low BP? Yes Did it involve sudden or severe rash/hives, skin peeling, or any reaction on the inside of your mouth or nose? Unknown Did you need to seek medical attention at a hospital or doctor's office? Yes When did it last happen?30 years ago If all above answers are "NO", may proceed with cephalosporin use.     Social History: The patient  reports that he has been smoking cigarettes. He has been smoking about 2.00 packs per day. He has never used smokeless tobacco. He reports that he does not drink alcohol or use drugs.   Family History: The patient's family history includes Leukemia in his mother.   Review of Systems: Please see the history of present illness.   All other systems are reviewed and negative.   Physical Exam: VS:  BP 110/60   Pulse 70   Ht 5\' 9"  (1.753 m)   Wt 168 lb (76.2 kg)   SpO2 97%   BMI 24.81 kg/m  .  BMI Body mass index is 24.81 kg/m.  Wt Readings from Last 3 Encounters:  11/21/18 168 lb (76.2 kg)  10/20/18 174 lb 9.6 oz (79.2 kg)  09/22/18 173 lb (78.5 kg)    General: Pleasant. Alert. Elderly. He is in no acute distress. Little hard of hearing.  HEENT: Normal.  Neck: Supple, no JVD, carotid bruits, or masses noted.  Cardiac: Irregular irregular rhythm. Rate is ok. No edema.  Respiratory:  Lungs are clear to auscultation bilaterally with normal work of breathing.  GI: Soft and nontender.  MS: No deformity or  atrophy. Gait and ROM intact.  Skin: Warm and dry. Color is normal.  Neuro:  Strength and sensation are intact and no gross focal deficits noted.  Psych: Alert, appropriate and with normal affect.   LABORATORY DATA:  EKG:  EKG is ordered today. This demonstrates V pacing with underlying AF.  Lab Results  Component Value Date   WBC 9.2 08/21/2018   HGB 14.4 08/21/2018   HCT 42.5 08/21/2018   PLT 319 08/21/2018   GLUCOSE 190 (H) 10/20/2018   ALT 14 06/13/2018   AST 16 06/13/2018   NA 140 10/20/2018   K 4.4  10/20/2018   CL 104 10/20/2018   CREATININE 1.35 (H) 10/20/2018   BUN 25 10/20/2018   CO2 22 10/20/2018   TSH 2.310 06/13/2018   INR 1.00 05/20/2011       BNP (last 3 results) No results for input(s): BNP in the last 8760 hours.  ProBNP (last 3 results) Recent Labs    08/21/18 1531 09/01/18 1026  PROBNP 1,907* 1,378*     Other Studies Reviewed Today:  ECHO IMPRESSIONS 09/2018   1. The left ventricle has normal systolic function, with an ejection fraction of 55-60%. The cavity size was moderately dilated. There is severe asymmetric left ventricular hypertrophy of the mid to distal septal walls wall. Left ventricular diastolic  Doppler parameters are consistent with pseudonormalization.  2. Left atrial size was moderately dilated.  3. Trivial pericardial effusion is present.  4. The mitral valve is grossly normal. There is severe mitral annular calcification present. No evidence of mitral valve stenosis.  5. The tricuspid valve is grossly normal.  6. The aortic valve is tricuspid. Moderate thickening of the aortic valve. Moderate calcification of the aortic valve. Aortic valve regurgitation is trivial by color flow Doppler.  7. The aorta is normal unless otherwise noted.  8. The aortic root, ascending aorta and aortic arch are normal in size and structure.  Assessment/Plan:  1. Chronic diastolic HF - weight is down - swelling has improved - I suspect the  social situation with his son/wife played a big role.   I am happy to do his general cardiology going forward.   2. CHB - with underlying PPM - to see Dr. Caryl Comes in January  3. Persistent AF - managed with rate control and he remains on anticoagulation - he also remains on amiodarone - ? For rate control but may want to entertain stopping and using beta blocker - will defer to Dr. Caryl Comes.   4. HTN - BP is ok on current regimen.   5. History of recurrent DVTs - remains on Xarelto  6. High risk medicine - on lower dose of Xarelto - remains on Amiodarone   7. CAD - per patient - no chest pain.   8. Suspected OSA - he does not wish to have a sleep study - I suspect his home situation plays a role here. I would not push this issue.   9. COVID-19 Education: The signs and symptoms of COVID-19 were discussed with the patient and how to seek care for testing (follow up with PCP or arrange E-visit).  The importance of social distancing, staying at home, hand hygiene and wearing a mask when out in public were discussed today.  Current medicines are reviewed with the patient today.  The patient does not have concerns regarding medicines other than what has been noted above.  The following changes have been made:  See above.  Labs/ tests ordered today include:    Orders Placed This Encounter  Procedures  . Basic metabolic panel  . CBC  . Hepatic function panel  . TSH  . EKG 12-Lead     Disposition:   FU with Dr. Caryl Comes per the recall in January - I will see back in March of 2021.    Patient is agreeable to this plan and will call if any problems develop in the interim.   SignedTruitt Merle, NP  11/21/2018 12:17 PM  [facility] [address] [place], [place]  [address] Phone: [phone] Fax: [phone]

## 2018-11-21 ENCOUNTER — Other Ambulatory Visit: Payer: Self-pay

## 2018-11-21 ENCOUNTER — Ambulatory Visit: Payer: Medicare Other | Admitting: Nurse Practitioner

## 2018-11-21 ENCOUNTER — Encounter: Payer: Self-pay | Admitting: Nurse Practitioner

## 2018-11-21 VITALS — BP 110/60 | HR 70 | Ht 69.0 in | Wt 168.0 lb

## 2018-11-21 DIAGNOSIS — I4819 Other persistent atrial fibrillation: Secondary | ICD-10-CM

## 2018-11-21 DIAGNOSIS — I2510 Atherosclerotic heart disease of native coronary artery without angina pectoris: Secondary | ICD-10-CM

## 2018-11-21 DIAGNOSIS — Z79899 Other long term (current) drug therapy: Secondary | ICD-10-CM

## 2018-11-21 DIAGNOSIS — I5032 Chronic diastolic (congestive) heart failure: Secondary | ICD-10-CM | POA: Diagnosis not present

## 2018-11-21 DIAGNOSIS — I442 Atrioventricular block, complete: Secondary | ICD-10-CM

## 2018-11-21 DIAGNOSIS — I10 Essential (primary) hypertension: Secondary | ICD-10-CM | POA: Diagnosis not present

## 2018-11-21 DIAGNOSIS — Z7189 Other specified counseling: Secondary | ICD-10-CM

## 2018-11-21 LAB — BASIC METABOLIC PANEL
BUN/Creatinine Ratio: 20 (ref 10–24)
BUN: 31 mg/dL — ABNORMAL HIGH (ref 8–27)
CO2: 24 mmol/L (ref 20–29)
Calcium: 9.2 mg/dL (ref 8.6–10.2)
Chloride: 97 mmol/L (ref 96–106)
Creatinine, Ser: 1.58 mg/dL — ABNORMAL HIGH (ref 0.76–1.27)
GFR calc Af Amer: 45 mL/min/{1.73_m2} — ABNORMAL LOW (ref >59)
GFR calc non Af Amer: 39 mL/min/{1.73_m2} — ABNORMAL LOW (ref >59)
Glucose: 147 mg/dL — ABNORMAL HIGH (ref 65–99)
Potassium: 4 mmol/L (ref 3.5–5.2)
Sodium: 138 mmol/L (ref 134–144)

## 2018-11-21 LAB — TSH: TSH: 2.1 u[IU]/mL (ref 0.450–4.500)

## 2018-11-21 LAB — CBC
Hematocrit: 41.2 % (ref 37.5–51.0)
Hemoglobin: 14.6 g/dL (ref 13.0–17.7)
MCH: 35.9 pg — ABNORMAL HIGH (ref 26.6–33.0)
MCHC: 35.4 g/dL (ref 31.5–35.7)
MCV: 101 fL — ABNORMAL HIGH (ref 79–97)
Platelets: 249 10*3/uL (ref 150–450)
RBC: 4.07 x10E6/uL — ABNORMAL LOW (ref 4.14–5.80)
RDW: 12.3 % (ref 11.6–15.4)
WBC: 14.2 10*3/uL — ABNORMAL HIGH (ref 3.4–10.8)

## 2018-11-21 LAB — HEPATIC FUNCTION PANEL
ALT: 16 IU/L (ref 0–44)
AST: 19 IU/L (ref 0–40)
Albumin: 4 g/dL (ref 3.6–4.6)
Alkaline Phosphatase: 116 IU/L (ref 39–117)
Bilirubin Total: 0.4 mg/dL (ref 0.0–1.2)
Bilirubin, Direct: 0.13 mg/dL (ref 0.00–0.40)
Total Protein: 6.6 g/dL (ref 6.0–8.5)

## 2018-11-21 NOTE — Patient Instructions (Signed)
After Visit Summary:  We will be checking the following labs today - BMET, CBC, HPF and TSH   Medication Instructions:    Continue with your current medicines.    If you need a refill on your cardiac medications before your next appointment, please call your pharmacy.     Testing/Procedures To Be Arranged:  N/A  Follow-Up:   See Dr. Caryl Comes in January per recall  See me in March 2021    At [facility], you and your health needs are our priority.  As part of our continuing mission to provide you with exceptional heart care, we have created designated Provider Care Teams.  These Care Teams include your primary Cardiologist (physician) and Advanced Practice Providers (APPs -  Physician Assistants and Nurse Practitioners) who all work together to provide you with the care you need, when you need it.  Special Instructions:  . Stay safe, stay home, wash your hands for at least 20 seconds and wear a mask when out in public.  . It was good to talk with you today. . Try to keep staying away from the salt.     Call the [place] office at [phone] if you have any questions, problems or concerns.

## 2018-11-21 NOTE — Progress Notes (Signed)
Remote pacemaker transmission.   

## 2018-11-22 ENCOUNTER — Ambulatory Visit: Payer: Medicare Other | Admitting: Cardiology

## 2019-02-09 ENCOUNTER — Telehealth: Payer: Self-pay

## 2019-02-09 ENCOUNTER — Encounter: Payer: Medicare Other | Admitting: Internal Medicine

## 2019-02-09 NOTE — Telephone Encounter (Signed)
Spoke with patient to remind of missed remote transmission 

## 2019-02-20 ENCOUNTER — Telehealth: Payer: Self-pay | Admitting: Physician Assistant

## 2019-02-20 MED ORDER — FUROSEMIDE 40 MG PO TABS: 40.0000 mg | ORAL_TABLET | Freq: Every day | ORAL | 2 refills | Status: DC

## 2019-02-20 NOTE — Telephone Encounter (Signed)
*  STAT* If patient is at the pharmacy, call can be transferred to refill team.   1. Which medications need to be refilled? (please list name of each medication and dose if known)  furosemide (LASIX) 40 MG tablet  2. Which pharmacy/location (including street and city if local pharmacy) is medication to be sent to?  [facility] [place] [address]  3. Do they need a 30 day or 90 day supply? 90  Patient was recently told to increase the dosage from 1/2 a tablet to a full tablet. Due to the increased dosage he has run out of his medication. He will need a new rx sent to the pharmacy with an updated dose

## 2019-02-20 NOTE — Telephone Encounter (Signed)
Pt's medication was sent to pt's pharmacy as requested. Confirmation received.  °

## 2019-03-02 ENCOUNTER — Other Ambulatory Visit: Payer: Self-pay | Admitting: Student

## 2019-03-02 MED ORDER — SPIRONOLACTONE 25 MG PO TABS: 12.5000 mg | ORAL_TABLET | Freq: Every day | ORAL | 2 refills | Status: DC

## 2019-03-02 NOTE — Telephone Encounter (Signed)
Pt's medication was sent to pt's pharmacy as requested. Confirmation received.  °

## 2019-03-15 ENCOUNTER — Ambulatory Visit (INDEPENDENT_AMBULATORY_CARE_PROVIDER_SITE_OTHER): Payer: Medicare Other | Admitting: *Deleted

## 2019-03-15 ENCOUNTER — Telehealth: Payer: Self-pay

## 2019-03-15 DIAGNOSIS — I4891 Unspecified atrial fibrillation: Secondary | ICD-10-CM | POA: Diagnosis not present

## 2019-03-15 LAB — CUP PACEART REMOTE DEVICE CHECK
Battery Remaining Longevity: 32 mo
Battery Remaining Percentage: 29 %
Battery Voltage: 2.81 V
Brady Statistic AP VP Percent: 49 %
Brady Statistic AP VS Percent: 1 %
Brady Statistic AS VP Percent: 51 %
Brady Statistic AS VS Percent: 1 %
Brady Statistic RA Percent Paced: 9.3 %
Brady Statistic RV Percent Paced: 99 %
Date Time Interrogation Session: 20210225132931
Implantable Lead Implant Date: 20130502
Implantable Lead Implant Date: 20130502
Implantable Lead Location: 753859
Implantable Lead Location: 753860
Implantable Pulse Generator Implant Date: 20130502
Lead Channel Impedance Value: 400 Ohm
Lead Channel Impedance Value: 480 Ohm
Lead Channel Pacing Threshold Amplitude: 0.75 V
Lead Channel Pacing Threshold Amplitude: 0.75 V
Lead Channel Pacing Threshold Pulse Width: 0.5 ms
Lead Channel Pacing Threshold Pulse Width: 0.5 ms
Lead Channel Sensing Intrinsic Amplitude: 12 mV
Lead Channel Sensing Intrinsic Amplitude: 2.9 mV
Lead Channel Setting Pacing Amplitude: 2 V
Lead Channel Setting Pacing Amplitude: 2.5 V
Lead Channel Setting Pacing Pulse Width: 0.5 ms
Lead Channel Setting Sensing Sensitivity: 4 mV
Pulse Gen Model: 2110
Pulse Gen Serial Number: 7321242

## 2019-03-15 NOTE — Telephone Encounter (Signed)
Pt wife states they were trying to send a manual transmission but it did not work. The monitor is stuck on the tower and all the lights are going off at the same time. I gave them the number to [facility] support to get additional help.

## 2019-03-16 NOTE — Progress Notes (Signed)
PPM Remote  

## 2019-03-23 NOTE — Progress Notes (Deleted)
CARDIOLOGY OFFICE NOTE  Date:  03/23/2019    Lance Smith Date of Birth: 10-Jul-1931 Medical Record [id number]  PCP:  Burnard Bunting, MD  Cardiologist:  Ree Shay   No chief complaint on file.   History of Present Illness: Lance Smith is a 86 y.o. male who presents today for a 4 month check.  Seen for Dr. Caryl Comes.   He has a history of CHB s/p PPM, CKD III, COPD, DM, chronic diastolic CHF, recurrent DVT, persistent Afib on Xarelto, HLD, HTN, and CAD.   He had a telehealth visit back in May with Dr. Caryl Comes. Towards the end of May noted to have AF and had cardioversion. He had short lived results - managed now with rate control and continued anticoagulation. He has had worsening edema - has required additional diuretics and the addition of Aldactone.   I saw him back in November - he was doing ok - using less salt and swelling was better. Very challenging social situation - Noted that the patient has a handicap son (21 year old non verbal with Down's) was hospitalized for 14 days back in the fall - his wife stayed in the hospital with the son for the entire time and thus, the patient ate too much fast food with excess salt during this time - probable the trigger for his demise. There is apparently a daughter that lives with them but he told me "she does not help". There is a Education officer, museum for the son and they have had a nurse come out once. The son was going to be needing another hospitalization soon.   The patient {does/does not:200015} have symptoms concerning for COVID-19 infection (fever, chills, cough, or new shortness of breath).   Comes in today. Here with   Past Medical History:  Diagnosis Date  . Adenomatous colon polyp 01/2002  . Arthritis   . CKD (chronic kidney disease) stage 3, GFR 30-59 ml/min   . Complete heart block ([place])    a. 05/2011 s/p SJM Model [id number] Dual Chamber PPM  . COPD (chronic obstructive pulmonary disease) ([place])   . Diabetes mellitus ([place])    . Diastolic CHF ([place])    a. [id number] Echo: EF 55-60%  . DVT (deep venous thrombosis), unspecified laterality    a. previously on coumadin - d/c'd spring of 2013  . Hyperglycemia   . Hyperlipidemia   . Hypertension   . Inferior myocardial infarction ([place]) 08/22/2013  . Steroid dependence ([place])   . Tobacco abuse     Past Surgical History:  Procedure Laterality Date  . bilateral inguinal hernia    . CARDIOVERSION N/A 06/16/2018   Procedure: CARDIOVERSION;  Surgeon: Fay Records, MD;  Location: [facility];  Service: Cardiovascular;  Laterality: N/A;  . MELANOMA EXCISION    . PACEMAKER INSERTION  May 2013  . PERMANENT PACEMAKER INSERTION Left 05/20/2011   Procedure: PERMANENT PACEMAKER INSERTION;  Surgeon: Deboraha Sprang, MD;  Location: [facility] CATH LAB;  Service: Cardiovascular;  Laterality: Left;     Medications: No outpatient medications have been marked as taking for the 03/27/19 encounter (Appointment) with Burtis Junes, NP.     Allergies: Allergies  Allergen Reactions  . Tetanus Toxoids Shortness Of Breath and Swelling  . Aspirin Nausea Only and Other (See Comments)    headache  . Penicillins Swelling    Did it involve swelling of the face/tongue/throat, SOB, or low BP? Yes Did it involve sudden or severe rash/hives, skin  peeling, or any reaction on the inside of your mouth or nose? Unknown Did you need to seek medical attention at a hospital or doctor's office? Yes When did it last happen?30 years ago If all above answers are "NO", may proceed with cephalosporin use.     Social History: The patient  reports that he has been smoking cigarettes. He has been smoking about 2.00 packs per day. He has never used smokeless tobacco. He reports that he does not drink alcohol or use drugs.   Family History: The patient's ***family history includes Leukemia in his mother.   Review of Systems: Please see the history of present illness.   All other systems are reviewed and  negative.   Physical Exam: VS:  There were no vitals taken for this visit. Marland Kitchen  BMI There is no height or weight on file to calculate BMI.  Wt Readings from Last 3 Encounters:  11/21/18 168 lb (76.2 kg)  10/20/18 174 lb 9.6 oz (79.2 kg)  09/22/18 173 lb (78.5 kg)    General: Pleasant. Well developed, well nourished and in no acute distress.   HEENT: Normal.  Neck: Supple, no JVD, carotid bruits, or masses noted.  Cardiac: ***Regular rate and rhythm. No murmurs, rubs, or gallops. No edema.  Respiratory:  Lungs are clear to auscultation bilaterally with normal work of breathing.  GI: Soft and nontender.  MS: No deformity or atrophy. Gait and ROM intact.  Skin: Warm and dry. Color is normal.  Neuro:  Strength and sensation are intact and no gross focal deficits noted.  Psych: Alert, appropriate and with normal affect.   LABORATORY DATA:  EKG:  EKG {ACTION; IS/IS [id number] ordered today. This demonstrates ***.  Lab Results  Component Value Date   WBC 14.2 (H) 11/21/2018   HGB 14.6 11/21/2018   HCT 41.2 11/21/2018   PLT 249 11/21/2018   GLUCOSE 147 (H) 11/21/2018   ALT 16 11/21/2018   AST 19 11/21/2018   NA 138 11/21/2018   K 4.0 11/21/2018   CL 97 11/21/2018   CREATININE 1.58 (H) 11/21/2018   BUN 31 (H) 11/21/2018   CO2 24 11/21/2018   TSH 2.100 11/21/2018   INR 1.00 05/20/2011       BNP (last 3 results) No results for input(s): BNP in the last 8760 hours.  ProBNP (last 3 results) Recent Labs    08/21/18 1531 09/01/18 1026  PROBNP 1,907* 1,378*     Other Studies Reviewed Today:  ECHO IMPRESSIONS 09/2018  1. The left ventricle has normal systolic function, with an ejection fraction of 55-60%. The cavity size was moderately dilated. There is severe asymmetric left ventricular hypertrophy of the mid to distal septal walls wall. Left ventricular diastolic  Doppler parameters are consistent with pseudonormalization. 2. Left atrial size was moderately  dilated. 3. Trivial pericardial effusion is present. 4. The mitral valve is grossly normal. There is severe mitral annular calcification present. No evidence of mitral valve stenosis. 5. The tricuspid valve is grossly normal. 6. The aortic valve is tricuspid. Moderate thickening of the aortic valve. Moderate calcification of the aortic valve. Aortic valve regurgitation is trivial by color flow Doppler. 7. The aorta is normal unless otherwise noted. 8. The aortic root, ascending aorta and aortic arch are normal in size and structure.  Assessment/Plan:  1. Chronic diastolic HF  2. CHB - with underlying PPM  3. Persistent AF - also on amiodarone???-    4. HTN  5. History of recurrent DVT's -  on anticoagulation   6. High risk medicine  7. CAD  8. Suspected OSA - has declined having sleep study -  9. COVID-19 Education: The signs and symptoms of COVID-19 were discussed with the patient and how to seek care for testing (follow up with PCP or arrange E-visit).  The importance of social distancing, staying at home, hand hygiene and wearing a mask when out in public were discussed today.  Current medicines are reviewed with the patient today.  The patient does not have concerns regarding medicines other than what has been noted above.  The following changes have been made:  See above.  Labs/ tests ordered today include:   No orders of the defined types were placed in this encounter.    Disposition:   FU with *** in {gen number [id number] {Days to years:10300}.   Patient is agreeable to this plan and will call if any problems develop in the interim.   SignedTruitt Merle, NP  03/23/2019 10:00 AM  [facility] [address] [place], [place]  [address] Phone: [phone] Fax: [phone]

## 2019-03-27 ENCOUNTER — Ambulatory Visit: Payer: Medicare Other | Admitting: Nurse Practitioner

## 2019-04-08 ENCOUNTER — Other Ambulatory Visit: Payer: Self-pay | Admitting: Internal Medicine

## 2019-04-12 ENCOUNTER — Other Ambulatory Visit: Payer: Self-pay

## 2019-04-12 ENCOUNTER — Telehealth (INDEPENDENT_AMBULATORY_CARE_PROVIDER_SITE_OTHER): Payer: Medicare Other | Admitting: Internal Medicine

## 2019-04-12 VITALS — BP 127/60 | HR 59 | Temp 98.6°F | Resp 24 | Ht 69.0 in | Wt 167.0 lb

## 2019-04-12 DIAGNOSIS — I5033 Acute on chronic diastolic (congestive) heart failure: Secondary | ICD-10-CM

## 2019-04-12 DIAGNOSIS — Z950 Presence of cardiac pacemaker: Secondary | ICD-10-CM

## 2019-04-12 DIAGNOSIS — I4811 Longstanding persistent atrial fibrillation: Secondary | ICD-10-CM | POA: Diagnosis not present

## 2019-04-12 DIAGNOSIS — Z79899 Other long term (current) drug therapy: Secondary | ICD-10-CM

## 2019-04-12 DIAGNOSIS — I442 Atrioventricular block, complete: Secondary | ICD-10-CM

## 2019-04-12 NOTE — Progress Notes (Signed)
Electrophysiology TeleHealth Note   Due to national recommendations of social distancing due to COVID 19, an audio/video telehealth visit is felt to be most appropriate for this patient at this time.  See MyChart message from today for the patient's consent to telehealth for [facility].   Date:  04/12/2019   ID:  Lance Smith, DOB [birth year], MRN [id number]  Location: patient's home  Provider location: [address] [place]  Evaluation Performed: Follow-up visit  PCP:  Burnard Bunting, MD  Cardiologist:     Electrophysiologist:  SK   Chief Complaint:  Shortness of breath and edema  History of Present Illness:    Lance Smith is a 86 y.o. male who presents via audio/video conferencing for a telehealth visit today.  Since last being seen in our clinic for pacemaker implanted May 2013 for complete heart block.  Intercurrently developed atrial fibrillation and was started 5/20 on amio and Rivaroxaban  He has struggled with edema and dyspnea over the last 5-6 months-- not any better in the last month coincidental with reversion to sinus ( based on device interrogation??) or still in afib      DATE TEST      5/13    Echo   EF 55-65 %    6/15 Myoview   EF 51 % fixed, medium-sized moderate basal inferior and inferoseptal perfusion defect and a fixed small, mild mid inferolateral perfusion defect.   9/20 Echo 55-60 Asymmetric LV hypertrophy mid-distal ( 1.1 cm???)      He also has history of recurrent lower extremity DVT; he says this is #3; he is   on chronic anticoagulation with Rivaroxaban   Date Cr K TSH LFT Hgb  5/19 1.31     14.8  11/20 1.58 4.0 2.1 16 14.6         The patient denies symptoms of fevers, chills, cough, or new SOB worrisome for COVID 19.    Past Medical History:  Diagnosis Date  . Adenomatous colon polyp 01/2002  . Arthritis   . CKD (chronic kidney disease) stage 3, GFR 30-59 ml/min   . Complete heart block ([place])    a. 05/2011 s/p  SJM Model [id number] Dual Chamber PPM  . COPD (chronic obstructive pulmonary disease) ([place])   . Diabetes mellitus ([place])   . Diastolic CHF ([place])    a. [id number] Echo: EF 55-60%  . DVT (deep venous thrombosis), unspecified laterality    a. previously on coumadin - d/c'd spring of 2013  . Hyperglycemia   . Hyperlipidemia   . Hypertension   . Inferior myocardial infarction ([place]) 08/22/2013  . Steroid dependence ([place])   . Tobacco abuse     Past Surgical History:  Procedure Laterality Date  . bilateral inguinal hernia    . CARDIOVERSION N/A 06/16/2018   Procedure: CARDIOVERSION;  Surgeon: Fay Records, MD;  Location: [facility];  Service: Cardiovascular;  Laterality: N/A;  . MELANOMA EXCISION    . PACEMAKER INSERTION  May 2013  . PERMANENT PACEMAKER INSERTION Left 05/20/2011   Procedure: PERMANENT PACEMAKER INSERTION;  Surgeon: Deboraha Sprang, MD;  Location: [facility] CATH LAB;  Service: Cardiovascular;  Laterality: Left;    Current Outpatient Medications  Medication Sig Dispense Refill  . furosemide (LASIX) 40 MG tablet Take 1 tablet (40 mg total) by mouth daily. 90 tablet 2  . metFORMIN (GLUCOPHAGE) 500 MG tablet Take 500 mg by mouth daily with breakfast.     . Multiple Vitamin (  MULTIVITAMIN WITH MINERALS) TABS tablet Take 1 tablet by mouth daily. Centrum Silver    . neomycin-polymyxin-hydrocortisone (CORTISPORIN) 3.5-10000-1 OTIC suspension Place 4 drops into the right ear daily.    . nitroGLYCERIN (NITROSTAT) 0.4 MG SL tablet Place 0.4 mg under the tongue every 5 (five) minutes as needed. For chest pain    . PACERONE 200 MG tablet TAKE 2 TABLETS BY MOUTH TWICE DAILY FOR 2 WEEKS AND 1 TWICE DAILY FOR 2 WEEKS AND 1 ONCE DAILY 90 tablet 2  . predniSONE (DELTASONE) 10 MG tablet Take 10 mg by mouth daily.     Marland Kitchen PROAIR HFA 108 (90 Base) MCG/ACT inhaler Inhale 2 puffs into the lungs See admin instructions. Inhale 2 puffs by mouth twice daily & every 4 hours as needed for wheezing or shortness of breath.     . Rivaroxaban (XARELTO) 15 MG TABS tablet Take 1 tablet (15 mg total) by mouth daily. 90 tablet 3  . simvastatin (ZOCOR) 40 MG tablet Take 40 mg by mouth every morning.     Marland Kitchen spironolactone (ALDACTONE) 25 MG tablet Take 0.5 tablets (12.5 mg total) by mouth daily. 90 tablet 2  . tamsulosin (FLOMAX) 0.4 MG CAPS capsule Take 0.4 mg by mouth daily.    Rhea Pink SINUS SEVERE 5-325-200 MG TABS Take 1 tablet by mouth daily as needed (sinus headache).     No current facility-administered medications for this visit.    Allergies:   Tetanus toxoids, Aspirin, and Penicillins   Social History:  The patient  reports that he has been smoking cigarettes. He has been smoking about 2.00 packs per day. He has never used smokeless tobacco. He reports that he does not drink alcohol or use drugs.   Family History:  The patient's   family history includes Leukemia in his mother.   ROS:  Please see the history of present illness.   All other systems are personally reviewed and negative.    Exam:    Vital Signs:  BP 127/60   Pulse (!) 59   Temp 98.6 F (37 C)   Resp (!) 24   Ht 5\' 9"  (1.753 m)   Wt 167 lb (75.8 kg)   BMI 24.66 kg/m     Labs/Other Tests and Data Reviewed:    Recent Labs: 09/01/2018: NT-Pro BNP 1,378 11/21/2018: ALT 16; BUN 31; Creatinine, Ser 1.58; Hemoglobin 14.6; Platelets 249; Potassium 4.0; Sodium 138; TSH 2.100   Wt Readings from Last 3 Encounters:  04/12/19 167 lb (75.8 kg)  11/21/18 168 lb (76.2 kg)  10/20/18 174 lb 9.6 oz (79.2 kg)     Other studies personally reviewed: Additional studies/ records that were reviewed today include: As above   Last device remote is reviewed from [facility] PDF dated 2/21 which reveals normal device function,   arrhythmias - none atrial fibrillation    ASSESSMENT & PLAN:       Complete heart block   CAD  Prior MI   Pacemaker-[employer]. Jude   DVT-recurrent  Renal  Insufficiency grade 3   Daytime somnolence/obstructive nocturnal  breathing   DOE   Atrial fibrillation/flutter-persistent   HOH  [place] overload  Pt with volume overload in what may be persistent atrial fibrillation despite amio loading   He is so hard of hearing I cant communicate reliably over the phone, so he is agreeable to coming into the office  Will get interrogation and if still in afib will plan DCCV  Will reduce amio to 200 mg daily  Needs amio labs  And will need augmented diuresis regardless of what is rhythm is;  Have to be cognizant of renal insufficiency    COVID 19 screen The patient denies symptoms of COVID 19 at this time.  The importance of social distancing was discussed today.  Follow-up:  OV with APP asap Next remote: ASAP  Confirm still afib  Current medicines are reviewed at length with the patient today.   The patient does not have concerns regarding his medicines.  The following changes were made today:  none  Labs/ tests ordered today include:TSH CMET No orders of the defined types were placed in this encounter.   Future tests ( post COVID )     Patient Risk:  after full review of this patients clinical status, I feel that they are at moderate risk at this time.  Today, I have spent 16 minutes with the patient with telehealth technology discussing the above.  Signed, Virl Axe, MD  04/12/2019 2:23 PM     [place] [address] [phone] (office) [phone] (fax)

## 2019-04-12 NOTE — Patient Instructions (Addendum)
Medication Instructions:  Your physician recommends that you continue on your current medications as directed. Please refer to the Current Medication list given to you today.  Labwork: TSH and CMET 04/13/2019  Testing/Procedures: None ordered.  Follow-Up: Your physician wants you to follow-up in: as soon as possible.  Dr Olin Pia scheduler will call you 04/13/2019 to schedule an appointment.  Your next remote monitoring will be completed as soon as possible as well.  A device RN will take care of this.  Remote monitoring is used to monitor your Pacemaker of ICD from home. This monitoring reduces the number of office visits required to check your device to one time per year. It allows [place] to keep an eye on the functioning of your device to ensure it is working properly.   Any Other Special Instructions Will Be Listed Below (If Applicable).  Instructions reviewed by phone with pt's wife.  If you need a refill on your cardiac medications before your next appointment, please call your pharmacy.

## 2019-04-12 NOTE — Addendum Note (Signed)
Addended by: Thora Lance on: 04/12/2019 06:06 PM   Modules accepted: Orders

## 2019-04-13 ENCOUNTER — Other Ambulatory Visit: Payer: Self-pay

## 2019-04-13 ENCOUNTER — Other Ambulatory Visit: Payer: Medicare Other | Admitting: *Deleted

## 2019-04-13 ENCOUNTER — Telehealth: Payer: Self-pay

## 2019-04-13 DIAGNOSIS — I442 Atrioventricular block, complete: Secondary | ICD-10-CM

## 2019-04-13 DIAGNOSIS — Z79899 Other long term (current) drug therapy: Secondary | ICD-10-CM

## 2019-04-13 DIAGNOSIS — I4811 Longstanding persistent atrial fibrillation: Secondary | ICD-10-CM

## 2019-04-13 DIAGNOSIS — I5033 Acute on chronic diastolic (congestive) heart failure: Secondary | ICD-10-CM

## 2019-04-13 NOTE — Telephone Encounter (Signed)
-----   Message from Thora Lance, RN sent at 04/12/2019  5:49 PM EDT ----- Regarding: remote Can you please schedule a remote check ASAP to confirm pt is still in AFIB and let Dr Caryl Comes know.  I am off tomorrow.  Thank you,  Rosann Auerbach

## 2019-04-13 NOTE — Telephone Encounter (Signed)
The pt wife agreed to send a manual transmission with his home monitor after breakfast.

## 2019-04-13 NOTE — Telephone Encounter (Signed)
Remote transmission received.  Pt does not appear to be in AF, pt has not had any recent AF episodes.   Pt had telemed visit with Dr. Caryl Comes yesterday, sending manual transmission today as follow-up.

## 2019-04-13 NOTE — Telephone Encounter (Signed)
I had the pt wife unplug the monitor for 20 seconds to reset the monitor. The transmission received. I told her the nurse will review the transmission and someone will give them a call back. The pt wife thanked me for my help and verbalized understanding.

## 2019-04-14 LAB — COMPREHENSIVE METABOLIC PANEL
ALT: 16 IU/L (ref 0–44)
AST: 20 IU/L (ref 0–40)
Albumin/Globulin Ratio: 1.5 (ref 1.2–2.2)
Albumin: 3.8 g/dL (ref 3.6–4.6)
Alkaline Phosphatase: 108 IU/L (ref 39–117)
BUN/Creatinine Ratio: 18 (ref 10–24)
BUN: 28 mg/dL — ABNORMAL HIGH (ref 8–27)
Bilirubin Total: 0.3 mg/dL (ref 0.0–1.2)
CO2: 19 mmol/L — ABNORMAL LOW (ref 20–29)
Calcium: 8.7 mg/dL (ref 8.6–10.2)
Chloride: 103 mmol/L (ref 96–106)
Creatinine, Ser: 1.55 mg/dL — ABNORMAL HIGH (ref 0.76–1.27)
GFR calc Af Amer: 46 mL/min/{1.73_m2} — ABNORMAL LOW (ref >59)
GFR calc non Af Amer: 40 mL/min/{1.73_m2} — ABNORMAL LOW (ref >59)
Globulin, Total: 2.5 g/dL (ref 1.5–4.5)
Glucose: 148 mg/dL — ABNORMAL HIGH (ref 65–99)
Potassium: 4.6 mmol/L (ref 3.5–5.2)
Sodium: 142 mmol/L (ref 134–144)
Total Protein: 6.3 g/dL (ref 6.0–8.5)

## 2019-04-14 LAB — TSH: TSH: 2.41 u[IU]/mL (ref 0.450–4.500)

## 2019-04-16 ENCOUNTER — Telehealth: Payer: Self-pay | Admitting: Internal Medicine

## 2019-04-16 NOTE — Telephone Encounter (Signed)
Reviewed chart because patient's wife called in because she received a call from our office in regards to who she thought was her husband.  Did not find where anyone called husband today.

## 2019-04-19 NOTE — Progress Notes (Signed)
Electrophysiology Office Note Date: 04/20/2019  ID:  Farhan, Lehn 1931-12-02, MRN [id number]  PCP: Burnard Bunting, MD Primary Cardiologist: No primary care provider on file. Electrophysiologist: Dr. Caryl Comes  CC: Pacemaker follow-up  Lance Smith is a 86 y.o. male seen today for Dr. Caryl Comes . he presents today for routine electrophysiology followup. Since last being seen in our clinic, the patient reports doing OK overall. Has had recent increase in peripheral edema. Some component of chronic venous stasis as it worsen through the day. He denies SOB at this time with ADLs. He drinks 4-5 10 oz bottles of water daily, a large bottle (unclear if 1-2L) of cola lasts 2-3 days, 2 cups of coffee every morning, and 3-4 oranges daily (~86% water by weight). He has tried compression hose before, but doesn't like how it pushes the swelling up his legs and "mushrooms" above the hose. He limits salt intake. He doesn't add any table salt. He eats deli meat daily. He denies chest pain, palpitations, dyspnea, PND,  nausea, vomiting, dizziness, syncope, weight gain, or early satiety.  Device History: St. Jude Dual Chamber PPM implanted 2013 for CHB  Past Medical History:  Diagnosis Date  . Adenomatous colon polyp 01/2002  . Arthritis   . CKD (chronic kidney disease) stage 3, GFR 30-59 ml/min   . Complete heart block ([place])    a. 05/2011 s/p SJM Model [id number] Dual Chamber PPM  . COPD (chronic obstructive pulmonary disease) ([place])   . Diabetes mellitus ([place])   . Diastolic CHF ([place])    a. [id number] Echo: EF 55-60%  . DVT (deep venous thrombosis), unspecified laterality    a. previously on coumadin - d/c'd spring of 2013  . Hyperglycemia   . Hyperlipidemia   . Hypertension   . Inferior myocardial infarction ([place]) 08/22/2013  . Steroid dependence ([place])   . Tobacco abuse    Past Surgical History:  Procedure Laterality Date  . bilateral inguinal hernia    . CARDIOVERSION N/A 06/16/2018   Procedure:  CARDIOVERSION;  Surgeon: Fay Records, MD;  Location: [facility];  Service: Cardiovascular;  Laterality: N/A;  . MELANOMA EXCISION    . PACEMAKER INSERTION  May 2013  . PERMANENT PACEMAKER INSERTION Left 05/20/2011   Procedure: PERMANENT PACEMAKER INSERTION;  Surgeon: Deboraha Sprang, MD;  Location: [facility] CATH LAB;  Service: Cardiovascular;  Laterality: Left;    Current Outpatient Medications  Medication Sig Dispense Refill  . furosemide (LASIX) 40 MG tablet Take 1 tablet (40 mg total) by mouth daily. 90 tablet 2  . metFORMIN (GLUCOPHAGE) 500 MG tablet Take 500 mg by mouth daily with breakfast.     . Multiple Vitamin (MULTIVITAMIN WITH MINERALS) TABS tablet Take 1 tablet by mouth daily. Centrum Silver    . neomycin-polymyxin-hydrocortisone (CORTISPORIN) 3.5-10000-1 OTIC suspension Place 4 drops into the right ear daily.    . nitroGLYCERIN (NITROSTAT) 0.4 MG SL tablet Place 0.4 mg under the tongue every 5 (five) minutes as needed. For chest pain    . PACERONE 200 MG tablet TAKE 2 TABLETS BY MOUTH TWICE DAILY FOR 2 WEEKS AND 1 TWICE DAILY FOR 2 WEEKS AND 1 ONCE DAILY 90 tablet 2  . predniSONE (DELTASONE) 10 MG tablet Take 10 mg by mouth daily.     Marland Kitchen PROAIR HFA 108 (90 Base) MCG/ACT inhaler Inhale 2 puffs into the lungs See admin instructions. Inhale 2 puffs by mouth twice daily & every 4 hours as needed for wheezing or shortness of  breath.    . Rivaroxaban (XARELTO) 15 MG TABS tablet Take 1 tablet (15 mg total) by mouth daily. 90 tablet 3  . simvastatin (ZOCOR) 40 MG tablet Take 40 mg by mouth every morning.     Marland Kitchen spironolactone (ALDACTONE) 25 MG tablet Take 0.5 tablets (12.5 mg total) by mouth daily. 90 tablet 2  . tamsulosin (FLOMAX) 0.4 MG CAPS capsule Take 0.4 mg by mouth daily.    Rhea Pink SINUS SEVERE 5-325-200 MG TABS Take 1 tablet by mouth daily as needed (sinus headache).     No current facility-administered medications for this visit.    Allergies:   Tetanus toxoids, Aspirin, and  Penicillins   Social History: Social History   Socioeconomic History  . Marital status: Married    Spouse name: Not on file  . Number of children: Not on file  . Years of education: Not on file  . Highest education level: Not on file  Occupational History  . Occupation: retired  Tobacco Use  . Smoking status: Current Every Day Smoker    Packs/day: 2.00    Types: Cigarettes  . Smokeless tobacco: Never Used  Substance and Sexual Activity  . Alcohol use: No    Alcohol/week: 0.0 standard drinks  . Drug use: No  . Sexual activity: Not Currently  Other Topics Concern  . Not on file  Social History Narrative  . Not on file   Social Determinants of Health   Financial Resource Strain:   . Difficulty of Paying Living Expenses:   Food Insecurity:   . Worried About Charity fundraiser in the Last Year:   . Arboriculturist in the Last Year:   Transportation Needs:   . Film/video editor (Medical):   Marland Kitchen Lack of Transportation (Non-Medical):   Physical Activity:   . Days of Exercise per Week:   . Minutes of Exercise per Session:   Stress:   . Feeling of Stress :   Social Connections:   . Frequency of Communication with Friends and Family:   . Frequency of Social Gatherings with Friends and Family:   . Attends Religious Services:   . Active Member of Clubs or Organizations:   . Attends Archivist Meetings:   Marland Kitchen Marital Status:   Intimate Partner Violence:   . Fear of Current or Ex-Partner:   . Emotionally Abused:   Marland Kitchen Physically Abused:   . Sexually Abused:     Family History: Family History  Problem Relation Age of Onset  . Leukemia Mother   . Colon cancer Neg Hx      Review of Systems: All other systems reviewed and are otherwise negative except as noted above.  Physical Exam: Vitals:   04/20/19 0909  BP: 126/60  Pulse: 69  SpO2: 97%  Weight: 177 lb 3.2 oz (80.4 kg)  Height: 5\' 9"  (1.753 m)     GEN- The patient is elderly appearing and HOH.  Alert and oriented x 3 today.   HEENT: normocephalic, atraumatic; sclera clear, conjunctiva pink; hearing intact; oropharynx clear; neck supple  Lungs- Clear to ausculation bilaterally, normal work of breathing.  No wheezes, rales, rhonchi Heart- Regular rate and rhythm, no murmurs, rubs or gallops  GI- soft, non-tender, non-distended, bowel sounds present  Extremities- no clubbing or cyanosis. MS- no significant deformity or atrophy Skin- warm and dry, no rash or lesion; PPM pocket well healed Psych- euthymic mood, full affect Neuro- strength and sensation are intact  PPM Interrogation-  reviewed in detail today,  See PACEART report  EKG:  EKG is not ordered today.  Recent Labs: 09/01/2018: NT-Pro BNP 1,378 11/21/2018: Hemoglobin 14.6; Platelets 249 04/13/2019: ALT 16; BUN 28; Creatinine, Ser 1.55; Potassium 4.6; Sodium 142; TSH 2.410   Wt Readings from Last 3 Encounters:  04/20/19 177 lb 3.2 oz (80.4 kg)  04/12/19 167 lb (75.8 kg)  11/21/18 168 lb (76.2 kg)     Other studies Reviewed: Additional studies/ records that were reviewed today include: Previous EP office notes, Previous remote checks, Most recent labwork.   Assessment and Plan:  1. CHB s/p St. Jude PPM  Normal PPM function See Pace Art report No changes today  2. Atrial Fib/flutter persistent NSR today and by device. No AF since february Continue Xarelto for CHA2DS2VASC of at least 8   Continue amiodarone 200 mg daily. Surveillance labs today.  3. Chronic diastolic CHF Volume status stable on exam today with only trace to 1+ ankle edema. Trace edema at most along tibia 1/2 up to calf. As, above he has a copious amount of fluid intake daily. 40-50 oz of water, 10-12 oz of coffee, 10-20 oz of cola, and 3-4 oranges (~86% water by weight). I advised him to continue his current diuretic of lasix 40 mg daily and he should have <64 oz (2 Liter) of fluid intake daily.  Continue spironolactone 12.5 mg daily for now. Would  get his volume intake under control before increasing as to not over-correct.  Recommended compression hose  Current medicines are reviewed at length with the patient today.   The patient does not have concerns regarding his medicines.  The following changes were made today:  none  Labs/ tests ordered today include:  Orders Placed This Encounter  Procedures  . CUP PACEART INCLINIC DEVICE CHECK   Disposition:   Follow up with Dr. Caryl Comes in 3 Months   Signed, Annamaria Helling  04/20/2019 10:04 AM  [facility] [address] [place] [address] [phone] (office) [phone] (fax)

## 2019-04-20 ENCOUNTER — Encounter: Payer: Self-pay | Admitting: Student

## 2019-04-20 ENCOUNTER — Other Ambulatory Visit: Payer: Self-pay

## 2019-04-20 ENCOUNTER — Ambulatory Visit: Payer: Medicare Other | Admitting: Student

## 2019-04-20 VITALS — BP 126/60 | HR 69 | Ht 69.0 in | Wt 177.2 lb

## 2019-04-20 DIAGNOSIS — Z950 Presence of cardiac pacemaker: Secondary | ICD-10-CM

## 2019-04-20 DIAGNOSIS — I10 Essential (primary) hypertension: Secondary | ICD-10-CM | POA: Diagnosis not present

## 2019-04-20 DIAGNOSIS — I4891 Unspecified atrial fibrillation: Secondary | ICD-10-CM | POA: Diagnosis not present

## 2019-04-20 DIAGNOSIS — I5032 Chronic diastolic (congestive) heart failure: Secondary | ICD-10-CM

## 2019-04-20 LAB — CUP PACEART INCLINIC DEVICE CHECK
Battery Remaining Longevity: 33 mo
Battery Voltage: 2.81 V
Brady Statistic RA Percent Paced: 16 %
Brady Statistic RV Percent Paced: 99.94 %
Date Time Interrogation Session: 20210402094348
Implantable Lead Implant Date: 20130502
Implantable Lead Implant Date: 20130502
Implantable Lead Location: 753859
Implantable Lead Location: 753860
Implantable Pulse Generator Implant Date: 20130502
Lead Channel Impedance Value: 450 Ohm
Lead Channel Impedance Value: 475 Ohm
Lead Channel Pacing Threshold Amplitude: 0.75 V
Lead Channel Pacing Threshold Amplitude: 0.75 V
Lead Channel Pacing Threshold Amplitude: 1 V
Lead Channel Pacing Threshold Amplitude: 1 V
Lead Channel Pacing Threshold Pulse Width: 0.5 ms
Lead Channel Pacing Threshold Pulse Width: 0.5 ms
Lead Channel Pacing Threshold Pulse Width: 0.5 ms
Lead Channel Pacing Threshold Pulse Width: 0.5 ms
Lead Channel Sensing Intrinsic Amplitude: 12 mV
Lead Channel Sensing Intrinsic Amplitude: 2.2 mV
Lead Channel Setting Pacing Amplitude: 2 V
Lead Channel Setting Pacing Amplitude: 2.5 V
Lead Channel Setting Pacing Pulse Width: 0.5 ms
Lead Channel Setting Sensing Sensitivity: 4 mV
Pulse Gen Model: 2110
Pulse Gen Serial Number: 7321242

## 2019-04-20 IMAGING — CT CT CHEST W/O CM
1 series · 14 of 34 positions shown, 18 images · non-contrast
Comparison: August 28, 2013

CLINICAL DATA: Cough.  History of underlying COPD

EXAM:
CT CHEST WITHOUT CONTRAST
TECHNIQUE: Multidetector CT imaging of the chest was performed following the
standard protocol without IV contrast.

[Series 2: chest w/(date) · axial · 0.82mm/px · z∈[-373,-69]mm · 14 of 180 slices shown, 18 images]
[im 14/180  mediastinal]
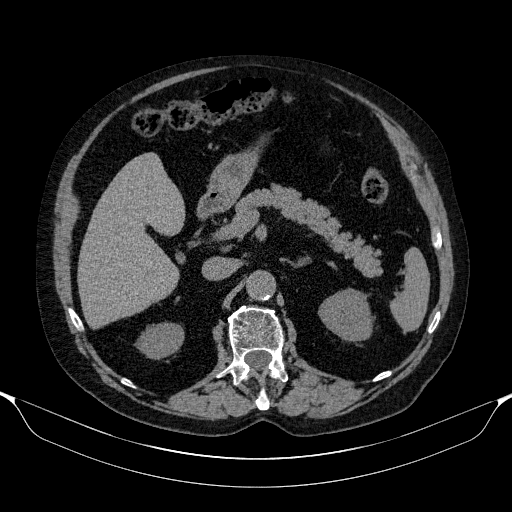
[im 14/180  lung]
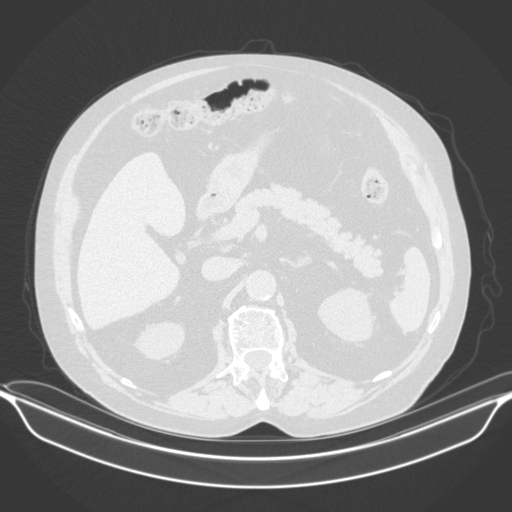
[im 27/180  lung]
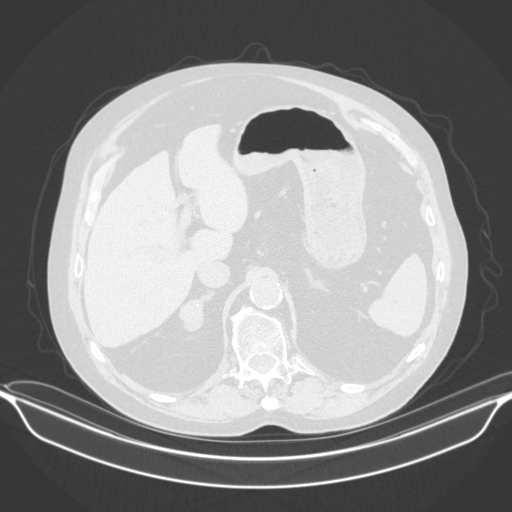
[im 36/180  lung]
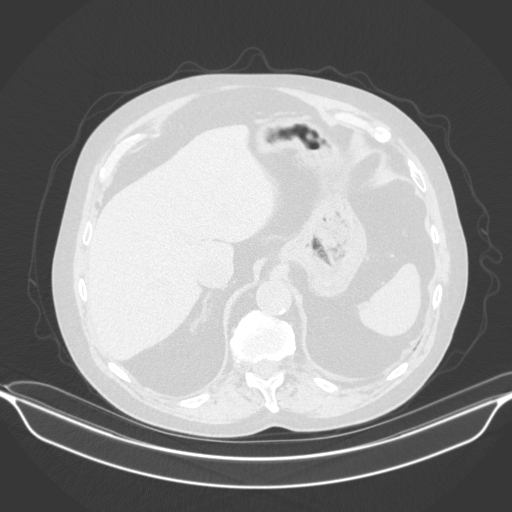
[im 54/180  lung]
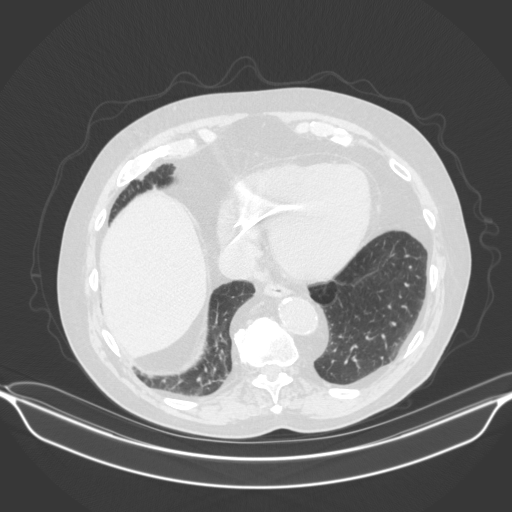
[im 67/180  mediastinal]
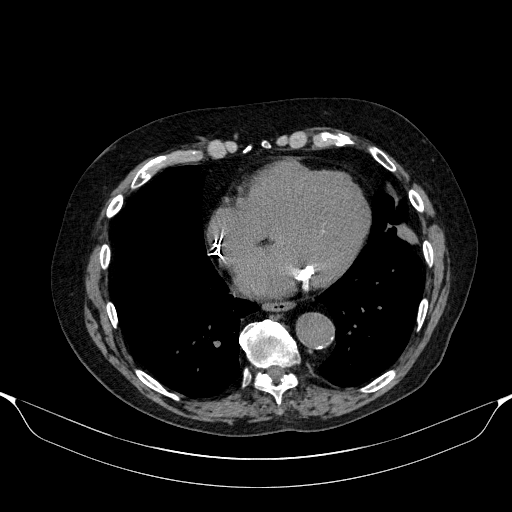
[im 67/180  lung]
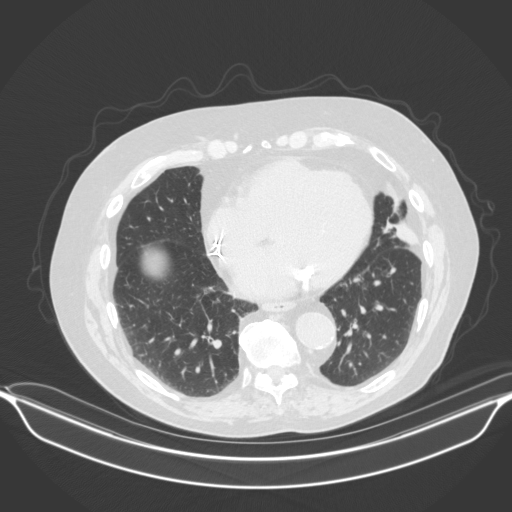
[im 73/180  lung]
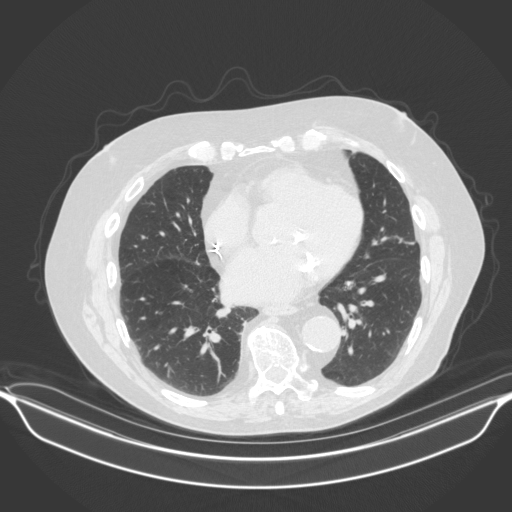
[im 86/180  lung]
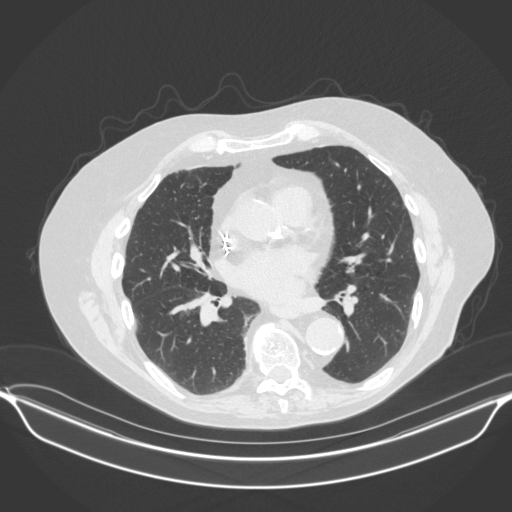
[im 95/180  lung]
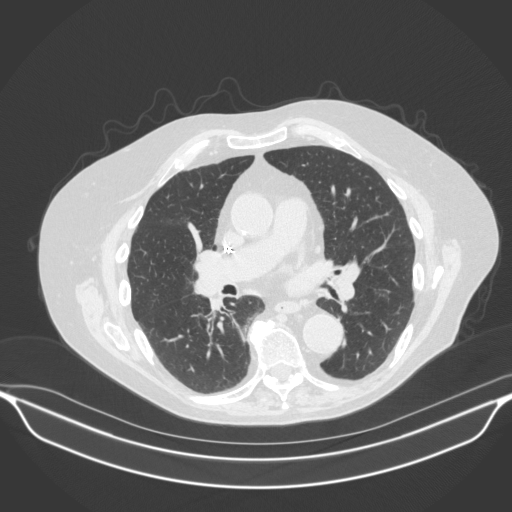
[im 107/180  mediastinal]
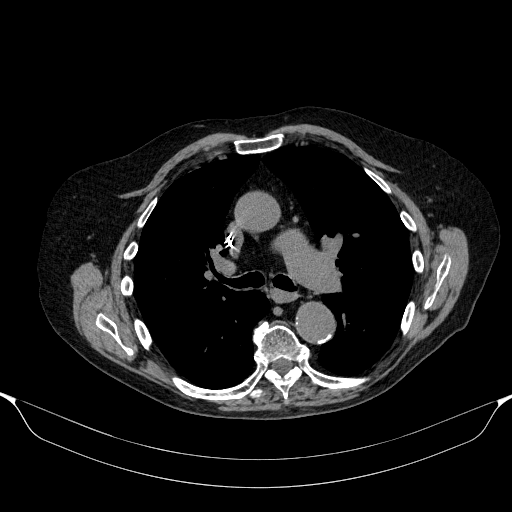
[im 107/180  lung]
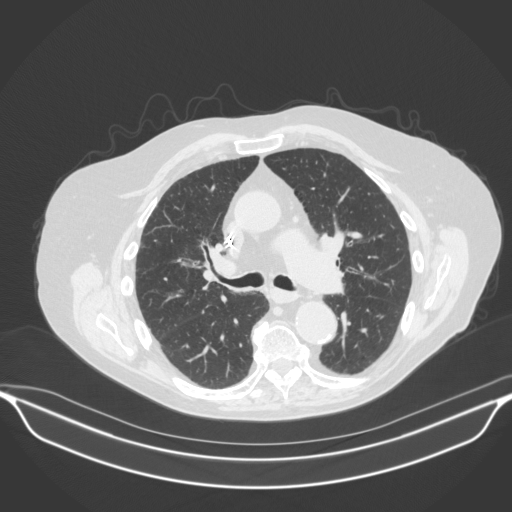
[im 113/180  lung]
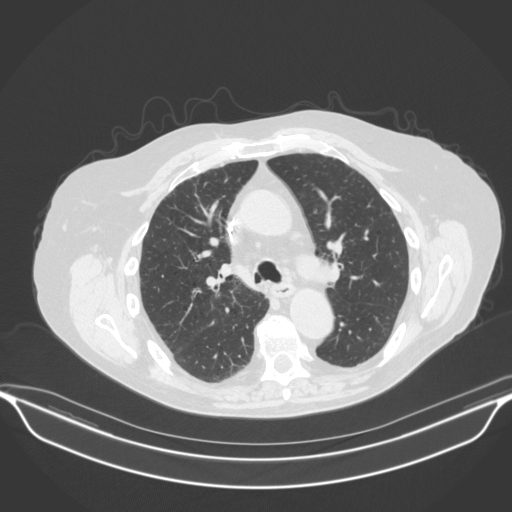
[im 133/180  lung]
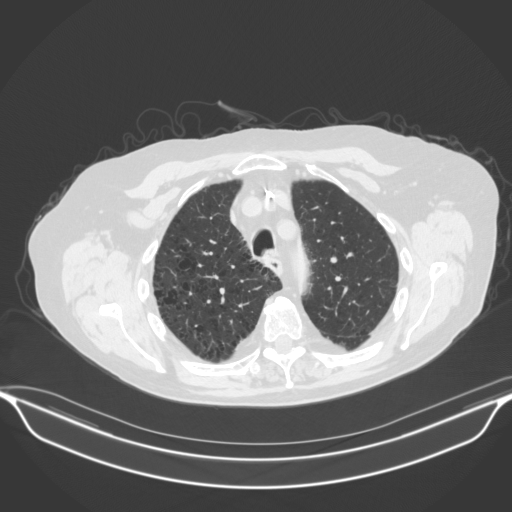
[im 144/180  lung]
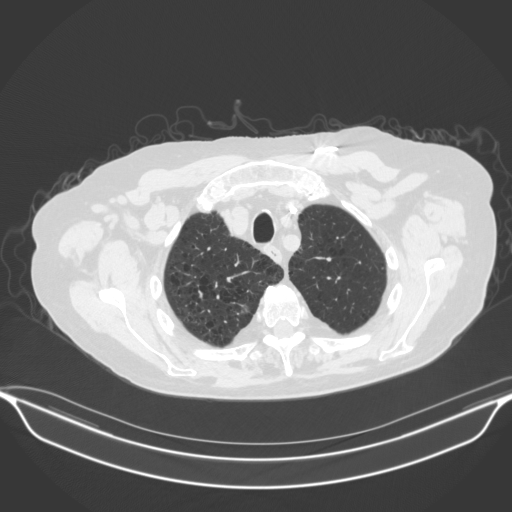
[im 153/180  mediastinal]
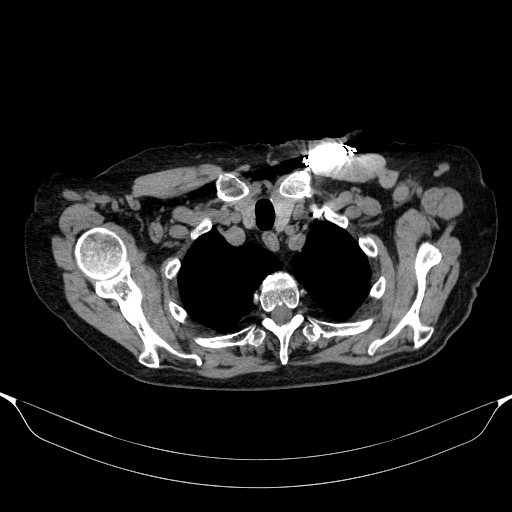
[im 153/180  lung]
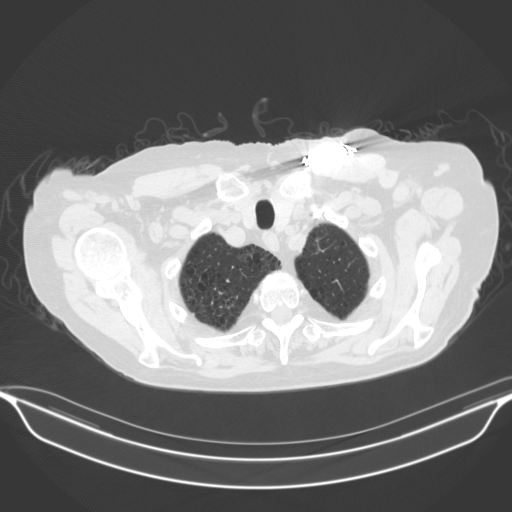
[im 166/180  lung]
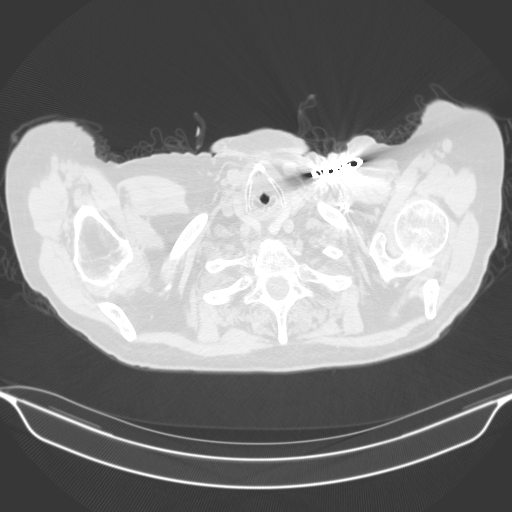

[14 of 34 positions shown; findings below may reference images not displayed]

FINDINGS: Cardiovascular: There is no demonstrable thoracic aortic aneurysm.
Visualized great vessels show patchy areas of calcification without
hemodynamically significant obstruction. There is aortic
atherosclerosis. There are multiple foci of coronary artery
calcification. Pacemaker leads are attached to the right atrium and
right ventricle. No pericardial effusion or pericardial thickening
evident. There is extensive mitral annulus calcification. There are
foci of coronary artery calcification.

Mediastinum/Nodes: No thyroid lesions are appreciable. There are
scattered subcentimeter mediastinal lymph nodes. There is no
thoracic adenopathy. No esophageal lesions are evident.

Lungs/Pleura: There is underlying centrilobular emphysematous
change. There is scarring in the apices bilaterally. In comparison
with the previous CT, there is focal airspace consolidation in a
portion of the inferior lingula, concerning for focal pneumonia. No
similar changes are evident elsewhere. There is atelectatic change
in the posterior lung bases. A somewhat nodular appearing area on
the right posteriorly is currently involved with atelectasis and not
seen as a discrete lesion. No well-defined pulmonary nodular
opacities are currently evident. There is no pleural effusion
evident.

Upper Abdomen: In the visualized upper abdomen, there is aortic
atherosclerosis. There is a cyst arising from the lateral aspect of
the mid right kidney measuring 2.2 x 2.0 cm. There is a mass arising
from the right adrenal measuring 2.4 x 1.8 cm, not felt to be
appreciably changed. Foci of calcification noted in this area. There
are calcified splenic granulomas.

Musculoskeletal: There is degenerative change in the thoracic spine.
No blastic or lytic bone lesions. Pacemaker noted on the left
anteriorly. No other chest wall lesions evident.
IMPRESSION: 1. Focal infiltrate inferior lingula felt to represent pneumonia.
Followup PA and lateral chest radiographs recommended in 3-4 weeks
following trial of antibiotic therapy to ensure resolution and
exclude underlying malignancy.

2.  Underlying emphysematous change with areas of scarring.

3. Aortic atherosclerosis. Foci of coronary artery and great vessel
calcification. No thoracic aortic aneurysm evident. Pacemaker leads
attached to right atrium and right ventricle.

4.  Benign-appearing right adrenal mass.

5.  Calcified granulomas in the spleen.

Aortic Atherosclerosis (IJX46-JLO.O) and Emphysema (IJX46-5PC.S).

## 2019-04-20 NOTE — Patient Instructions (Addendum)
Medication Instructions:  none *If you need a refill on your cardiac medications before your next appointment, please call your pharmacy*   Lab Work: none If you have labs (blood work) drawn today and your tests are completely normal, you will receive your results only by: Marland Kitchen MyChart Message (if you have MyChart) OR . A paper copy in the mail If you have any lab test that is abnormal or we need to change your treatment, we will call you to review the results.   Testing/Procedures: none   Follow-Up: At [facility], you and your health needs are our priority.  As part of our continuing mission to provide you with exceptional heart care, we have created designated Provider Care Teams.  These Care Teams include your primary Cardiologist (physician) and Advanced Practice Providers (APPs -  Physician Assistants and Nurse Practitioners) who all work together to provide you with the care you need, when you need it.  We recommend signing up for the patient portal called "MyChart".  Sign up information is provided on this After Visit Summary.  MyChart is used to connect with patients for Virtual Visits (Telemedicine).  Patients are able to view lab/test results, encounter notes, upcoming appointments, etc.  Non-urgent messages can be sent to your provider as well.   To learn more about what you can do with MyChart, go to [email or web address].    Your next appointment:   3 months  The format for your next appointment:   Either In Person or Virtual  Provider:   Dr Caryl Comes   Other Instructions Remote monitoring is used to monitor your Pacemaker from home. This monitoring reduces the number of office visits required to check your device to one time per year. It allows [place] to keep an eye on the functioning of your device to ensure it is working properly. You are scheduled for a device check from home on 06/14/19. You may send your transmission at any time that day. If you have a wireless device,  the transmission will be sent automatically. After your physician reviews your transmission, you will receive a postcard with your next transmission date.  Do not drink more than 64 ounces in 1 DAY

## 2019-04-27 ENCOUNTER — Telehealth: Payer: Self-pay

## 2019-04-27 NOTE — Telephone Encounter (Signed)
Call to patient to review labs.    Pt verbalized understanding and has no further questions at this time.    Advised pt to call for any further questions or concerns.  No further orders.   

## 2019-04-27 NOTE — Telephone Encounter (Signed)
-----   Message from Deboraha Sprang, MD sent at 04/25/2019  9:04 PM EDT -----  Please inform patient that drug surveillance labs are normal

## 2019-05-17 ENCOUNTER — Ambulatory Visit (HOSPITAL_COMMUNITY)
Admission: EM | Admit: 2019-05-17 | Discharge: 2019-05-17 | Disposition: A | Discharge status: Home / Self Care | Payer: Medicare Other | Attending: Family Medicine | Admitting: Family Medicine

## 2019-05-17 ENCOUNTER — Other Ambulatory Visit: Payer: Self-pay

## 2019-05-17 ENCOUNTER — Encounter (HOSPITAL_COMMUNITY): Payer: Self-pay

## 2019-05-17 DIAGNOSIS — S41111A Laceration without foreign body of right upper arm, initial encounter: Secondary | ICD-10-CM | POA: Diagnosis not present

## 2019-05-17 NOTE — ED Provider Notes (Signed)
[facility]    CSN: [id number] Arrival date & time: 05/17/19  1336      History   Chief Complaint Chief Complaint  Patient presents with  . skin tear    HPI Lance Smith is a 86 y.o. male.   Initial [facility] patient visit.  This 86 year old man was trying to move a refrigerator and he fell resulting in large superficial flap lacerations on his right forearm.  He is allergic to tetanus toxoid.     Past Medical History:  Diagnosis Date  . Adenomatous colon polyp 01/2002  . Arthritis   . CKD (chronic kidney disease) stage 3, GFR 30-59 ml/min   . Complete heart block ([place])    a. 05/2011 s/p SJM Model [id number] Dual Chamber PPM  . COPD (chronic obstructive pulmonary disease) ([place])   . Diabetes mellitus ([place])   . Diastolic CHF ([place])    a. [id number] Echo: EF 55-60%  . DVT (deep venous thrombosis), unspecified laterality    a. previously on coumadin - d/c'd spring of 2013  . Hyperglycemia   . Hyperlipidemia   . Hypertension   . Inferior myocardial infarction ([place]) 08/22/2013  . Steroid dependence ([place])   . Tobacco abuse     Patient Active Problem List   Diagnosis Date Noted  . CAD (coronary artery disease),Prior IMI  08/22/2013  . Inferior myocardial infarction ([place]) 08/22/2013  . Hx of adenomatous colonic polyps 07/16/2013  . Atrial fibrillation ([place]) 06/02/2012  . Diastolic heart failure ([place]) 06/29/2011  . Hyperglycemia 05/24/2011  . Urinary retention 05/21/2011  . Steroid dependence ([place]) 05/21/2011  . Pacemaker-St.Jude 05/21/2011  . Complete heart block ([place]) 05/20/2011  . Tobacco abuse 05/20/2011    Past Surgical History:  Procedure Laterality Date  . bilateral inguinal hernia    . CARDIOVERSION N/A 06/16/2018   Procedure: CARDIOVERSION;  Surgeon: Fay Records, MD;  Location: [facility];  Service: Cardiovascular;  Laterality: N/A;  . MELANOMA EXCISION    . PACEMAKER INSERTION  May 2013  . PERMANENT PACEMAKER INSERTION Left 05/20/2011     Procedure: PERMANENT PACEMAKER INSERTION;  Surgeon: Deboraha Sprang, MD;  Location: [facility] CATH LAB;  Service: Cardiovascular;  Laterality: Left;       Home Medications    Prior to Admission medications   Medication Sig Start Date End Date Taking? Authorizing Provider  furosemide (LASIX) 40 MG tablet Take 1 tablet (40 mg total) by mouth daily. 02/20/19   Baldwin Jamaica, PA-C  metFORMIN (GLUCOPHAGE) 500 MG tablet Take 500 mg by mouth daily with breakfast.  07/13/15   [provider]  Multiple Vitamin (MULTIVITAMIN WITH MINERALS) TABS tablet Take 1 tablet by mouth daily. Centrum Silver    [provider]  neomycin-polymyxin-hydrocortisone (CORTISPORIN) 3.5-10000-1 OTIC suspension Place 4 drops into the right ear daily. 01/27/18   [provider]  nitroGLYCERIN (NITROSTAT) 0.4 MG SL tablet Place 0.4 mg under the tongue every 5 (five) minutes as needed. For chest pain    [provider]  PACERONE 200 MG tablet TAKE 2 TABLETS BY MOUTH TWICE DAILY FOR 2 WEEKS AND 1 TWICE DAILY FOR 2 WEEKS AND 1 ONCE DAILY 04/09/19   Baldwin Jamaica, PA-C  PROAIR HFA 108 [phone] Base) MCG/ACT inhaler Inhale 2 puffs into the lungs See admin instructions. Inhale 2 puffs by mouth twice daily & every 4 hours as needed for wheezing or shortness of breath. 05/05/18   [provider]  Rivaroxaban (XARELTO) 15 MG  TABS tablet Take 1 tablet (15 mg total) by mouth daily. 10/20/18   Shirley Friar, PA-C  simvastatin (ZOCOR) 40 MG tablet Take 40 mg by mouth every morning.  07/15/10   [provider]  spironolactone (ALDACTONE) 25 MG tablet Take 0.5 tablets (12.5 mg total) by mouth daily. 03/02/19   Shirley Friar, PA-C  tamsulosin (FLOMAX) 0.4 MG CAPS capsule Take 0.4 mg by mouth daily. 02/27/18   [provider]  TYLENOL SINUS SEVERE 5-325-200 MG TABS Take 1 tablet by mouth daily as needed (sinus headache).    [provider]    Family  History Family History  Problem Relation Age of Onset  . Leukemia Mother   . Colon cancer Neg Hx     Social History Social History   Tobacco Use  . Smoking status: Current Every Day Smoker    Packs/day: 2.00    Types: Cigarettes  . Smokeless tobacco: Never Used  Substance Use Topics  . Alcohol use: No    Alcohol/week: 0.0 standard drinks  . Drug use: No     Allergies   Tetanus toxoids, Aspirin, and Penicillins   Review of Systems Review of Systems  Skin: Positive for wound.  All other systems reviewed and are negative.    Physical Exam Triage Vital Signs ED Triage Vitals  Enc Vitals Group     BP 05/17/19 1350 120/63     Pulse Rate 05/17/19 1350 73     Resp 05/17/19 1350 16     Temp 05/17/19 1350 98.7 F (37.1 C)     Temp Source 05/17/19 1350 Oral     SpO2 05/17/19 1350 96 %     Weight --      Height --      Head Circumference --      Peak Flow --      Pain Score 05/17/19 1349 5     Pain Loc --      Pain Edu? --      Excl. in [place]? --    No data found.  Updated Vital Signs BP 120/63 (BP Location: Left Arm)   Pulse 73   Temp 98.7 F (37.1 C) (Oral)   Resp 16   SpO2 96%    Physical Exam Vitals and nursing note reviewed.  Constitutional:      General: He is not in acute distress.    Appearance: Normal appearance. He is obese. He is not ill-appearing.  HENT:     Head: Normocephalic.  Eyes:     Conjunctiva/sclera: Conjunctivae normal.  Cardiovascular:     Rate and Rhythm: Normal rate.  Pulmonary:     Effort: Pulmonary effort is normal.  Musculoskeletal:        General: Normal range of motion.     Cervical back: Normal range of motion and neck supple.  Skin:    General: Skin is warm.     Comments: 3 large flap lacerations over right elbow, right upper forearm, and right mid forearm.  Wound was washed with saline and then the flap margins were unfolded and extended to cover 90% of the wound area  Neurological:     General: No focal deficit  present.     Mental Status: He is alert.  Psychiatric:        Mood and Affect: Mood normal.      UC Treatments / Results  Labs (all labs ordered are listed, but only abnormal results are displayed) Labs Reviewed - No data  to display  EKG   Radiology No results found.  Procedures Laceration Repair  Date/Time: 05/17/2019 2:54 PM Performed by: Robyn Haber, MD Authorized by: Robyn Haber, MD   Consent:    Consent obtained:  Verbal   Consent given by:  Patient   Risks discussed:  Pain and poor wound healing   Alternatives discussed:  Delayed treatment Anesthesia (see MAR for exact dosages):    Anesthesia method:  None Laceration details:    Location:  Shoulder/arm   Shoulder/arm location:  R lower arm   Length (cm):  12   Depth (mm):  3 Repair type:    Repair type:  Intermediate Pre-procedure details:    Preparation:  Patient was prepped and draped in usual sterile fashion Treatment:    Area cleansed with:  Saline   Amount of cleaning:  Standard   Visualized foreign bodies/material removed: no   Skin repair:    Repair method:  Tissue adhesive and Steri-Strips   Number of Steri-Strips:  15 Approximation:    Approximation:  Close Post-procedure details:    Dressing:  Bulky dressing   Patient tolerance of procedure:  Tolerated well, no immediate complications   (including critical care time)  Medications Ordered in UC Medications - No data to display  Initial Impression / Assessment and Plan / UC Course  I have reviewed the triage vital signs and the nursing notes.  Pertinent labs & imaging results that were available during my care of the patient were reviewed by me and considered in my medical decision making (see chart for details).    Final Clinical Impressions(s) / UC Diagnoses   Final diagnoses:  Laceration of multiple sites of right arm with complication, initial encounter     Discharge Instructions     Avoid petroleum product  ointments including neosporin for a week.    You may gently wash the wounds with soap and water.    ED Prescriptions    None     I have reviewed the PDMP during this encounter.   Robyn Haber, MD 05/17/19 1456

## 2019-05-17 NOTE — ED Triage Notes (Signed)
Patient reports he was moving a freezer, and it caused three skin tears.

## 2019-05-17 NOTE — Discharge Instructions (Addendum)
Avoid petroleum product ointments including neosporin for a week.    You may gently wash the wounds with soap and water.

## 2019-06-25 ENCOUNTER — Other Ambulatory Visit (HOSPITAL_COMMUNITY): Payer: Self-pay | Admitting: Physician Assistant

## 2019-06-25 ENCOUNTER — Ambulatory Visit (HOSPITAL_COMMUNITY)
Admission: RE | Admit: 2019-06-25 | Discharge: 2019-06-25 | Disposition: A | Discharge status: Home / Self Care | Payer: Medicare Other | Source: Ambulatory Visit | Attending: Physician Assistant | Admitting: Physician Assistant

## 2019-06-25 ENCOUNTER — Other Ambulatory Visit: Payer: Self-pay

## 2019-06-25 DIAGNOSIS — M79604 Pain in right leg: Secondary | ICD-10-CM | POA: Diagnosis present

## 2019-06-25 DIAGNOSIS — M7989 Other specified soft tissue disorders: Secondary | ICD-10-CM | POA: Insufficient documentation

## 2019-06-25 NOTE — Progress Notes (Signed)
Venous duplex       has been completed. Preliminary results can be found under CV proc through chart review. June Leap, BS, RDMS, RVT    Attempted call report to main line, with long wait and no answer. No other direct number was given. Will let patient leave.

## 2019-06-26 ENCOUNTER — Telehealth: Payer: Self-pay

## 2019-06-26 ENCOUNTER — Ambulatory Visit (INDEPENDENT_AMBULATORY_CARE_PROVIDER_SITE_OTHER): Payer: Medicare Other | Admitting: *Deleted

## 2019-06-26 DIAGNOSIS — I442 Atrioventricular block, complete: Secondary | ICD-10-CM | POA: Diagnosis not present

## 2019-06-26 DIAGNOSIS — I4811 Longstanding persistent atrial fibrillation: Secondary | ICD-10-CM

## 2019-06-26 LAB — CUP PACEART REMOTE DEVICE CHECK
Battery Remaining Longevity: 26 mo
Battery Remaining Percentage: 25 %
Battery Voltage: 2.8 V
Brady Statistic AP VP Percent: 51 %
Brady Statistic AP VS Percent: 1 %
Brady Statistic AS VP Percent: 49 %
Brady Statistic AS VS Percent: 1 %
Brady Statistic RA Percent Paced: 50 %
Brady Statistic RV Percent Paced: 99 %
Date Time Interrogation Session: 20210608143429
Implantable Lead Implant Date: 20130502
Implantable Lead Implant Date: 20130502
Implantable Lead Location: 753859
Implantable Lead Location: 753860
Implantable Pulse Generator Implant Date: 20130502
Lead Channel Impedance Value: 360 Ohm
Lead Channel Impedance Value: 450 Ohm
Lead Channel Pacing Threshold Amplitude: 0.75 V
Lead Channel Pacing Threshold Amplitude: 1 V
Lead Channel Pacing Threshold Pulse Width: 0.5 ms
Lead Channel Pacing Threshold Pulse Width: 0.5 ms
Lead Channel Sensing Intrinsic Amplitude: 12 mV
Lead Channel Sensing Intrinsic Amplitude: 3.6 mV
Lead Channel Setting Pacing Amplitude: 2 V
Lead Channel Setting Pacing Amplitude: 2.5 V
Lead Channel Setting Pacing Pulse Width: 0.5 ms
Lead Channel Setting Sensing Sensitivity: 4 mV
Pulse Gen Model: 2110
Pulse Gen Serial Number: 7321242

## 2019-06-26 NOTE — Telephone Encounter (Signed)
Pt wife left a message she was trying to send a transmission but unsuccessful.   I tried calling them back but no answer/ no voicemail.

## 2019-06-27 NOTE — Progress Notes (Signed)
Remote pacemaker transmission.   

## 2019-07-25 ENCOUNTER — Ambulatory Visit: Payer: Medicare Other | Admitting: Internal Medicine

## 2019-07-25 ENCOUNTER — Encounter: Payer: Self-pay | Admitting: Internal Medicine

## 2019-07-25 ENCOUNTER — Other Ambulatory Visit: Payer: Self-pay

## 2019-07-25 VITALS — BP 104/62 | HR 62 | Ht 69.0 in | Wt 178.4 lb

## 2019-07-25 DIAGNOSIS — Z950 Presence of cardiac pacemaker: Secondary | ICD-10-CM | POA: Diagnosis not present

## 2019-07-25 DIAGNOSIS — I5032 Chronic diastolic (congestive) heart failure: Secondary | ICD-10-CM | POA: Diagnosis not present

## 2019-07-25 DIAGNOSIS — R079 Chest pain, unspecified: Secondary | ICD-10-CM

## 2019-07-25 DIAGNOSIS — I442 Atrioventricular block, complete: Secondary | ICD-10-CM | POA: Diagnosis not present

## 2019-07-25 DIAGNOSIS — I4819 Other persistent atrial fibrillation: Secondary | ICD-10-CM

## 2019-07-25 NOTE — Progress Notes (Signed)
Patient Care Team: Burnard Bunting, MD as PCP - General (Internal Medicine) Ladene Artist, MD as Consulting Physician (Gastroenterology) Deboraha Sprang, MD as Consulting Physician (Cardiology)   HPI  Lance Smith is a 86 y.o. male Seen following pacemaker implantation May 2013 for complete heart block. Echocardiogram at that time demonstrated an ejection fraction 55-60%  Persistent atrial fibrillation and history of recurrent lower extremity DVT.  Anticoagulated with rivaroxaban.  No significant bleeding except for superficial/mild traumatic  Atrial fibrillation underwent cardioversion 5/20 supported by amiodarone.  Has not been able to maintain sinus rhythm and it was commented that he was going to be left on rate control; however, he has spontaneously converted to sinus rhythm 2/21   Chronic shortness of breath.  Not particularly different.  Continues to smoke about a pack a day.  Intermittent chest discomfort.  Feels like gas.  Unassociated with exertion.  Duration 5-30 minutes.  He is steroid-dependent secondary to longstanding therapy for arthritis     DATE TEST EF   5/13    Echo   55-65 %   6/15 Myoview   51 % fixed, medium-sized moderate basal inferior and inferoseptal perfusion defect and a fixed small, mild mid inferolateral perfusion defect.  9/20 Echo  55-60%         Date Cr Hgb  6/15 1.36 14.1  5/19  1.31 14.8  3/21 1.55 14.6   He was seen 4/21 x 18.  Complaints of increasing edema.  Fluid intake was exuberant and restriction was recommended.    He is caring for his wife who is struggling with memory and seizures following brain surgery about 5 or 6 years ago and his son who is handicapped.  He is her main caregiver.     Past Medical History:  Diagnosis Date   Adenomatous colon polyp 01/2002   Arthritis    CKD (chronic kidney disease) stage 3, GFR 30-59 ml/min    Complete heart block ([place])    a. 05/2011 s/p SJM Model [id number] Dual Chamber PPM     COPD (chronic obstructive pulmonary disease) (HCC)    Diabetes mellitus (HCC)    Diastolic CHF ([place])    a. 04/9673 Echo: EF 55-60%   DVT (deep venous thrombosis), unspecified laterality    a. previously on coumadin - d/c'd spring of 2013   Hyperglycemia    Hyperlipidemia    Hypertension    Inferior myocardial infarction ([place]) 08/22/2013   Steroid dependence ([place])    Tobacco abuse     Past Surgical History:  Procedure Laterality Date   bilateral inguinal hernia     CARDIOVERSION N/A 06/16/2018   Procedure: CARDIOVERSION;  Surgeon: Fay Records, MD;  Location: [facility] ENDOSCOPY;  Service: Cardiovascular;  Laterality: N/A;   MELANOMA EXCISION     PACEMAKER INSERTION  May 2013   PERMANENT PACEMAKER INSERTION Left 05/20/2011   Procedure: PERMANENT PACEMAKER INSERTION;  Surgeon: Deboraha Sprang, MD;  Location: [facility] CATH LAB;  Service: Cardiovascular;  Laterality: Left;    Current Outpatient Medications  Medication Sig Dispense Refill   furosemide (LASIX) 40 MG tablet Take 1 tablet (40 mg total) by mouth daily. 90 tablet 2   metFORMIN (GLUCOPHAGE) 500 MG tablet Take 500 mg by mouth daily with breakfast.      Multiple Vitamin (MULTIVITAMIN WITH MINERALS) TABS tablet Take 1 tablet by mouth daily. Centrum Silver     neomycin-polymyxin-hydrocortisone (CORTISPORIN) 3.5-10000-1 OTIC suspension Place 4 drops into the right ear  daily.     nitroGLYCERIN (NITROSTAT) 0.4 MG SL tablet Place 0.4 mg under the tongue every 5 (five) minutes as needed. For chest pain     PACERONE 200 MG tablet TAKE 2 TABLETS BY MOUTH TWICE DAILY FOR 2 WEEKS AND 1 TWICE DAILY FOR 2 WEEKS AND 1 ONCE DAILY 90 tablet 2   PROAIR HFA 108 (90 Base) MCG/ACT inhaler Inhale 2 puffs into the lungs See admin instructions. Inhale 2 puffs by mouth twice daily & every 4 hours as needed for wheezing or shortness of breath.     Rivaroxaban (XARELTO) 15 MG TABS tablet Take 1 tablet (15 mg total) by mouth daily. 90 tablet 3    simvastatin (ZOCOR) 40 MG tablet Take 40 mg by mouth every morning.      spironolactone (ALDACTONE) 25 MG tablet Take 0.5 tablets (12.5 mg total) by mouth daily. 90 tablet 2   tamsulosin (FLOMAX) 0.4 MG CAPS capsule Take 0.4 mg by mouth daily.     TYLENOL SINUS SEVERE 5-325-200 MG TABS Take 1 tablet by mouth daily as needed (sinus headache).     No current facility-administered medications for this visit.    Allergies  Allergen Reactions   Tetanus Toxoids Shortness Of Breath and Swelling   Aspirin Nausea Only and Other (See Comments)    headache   Penicillins Swelling    Did it involve swelling of the face/tongue/throat, SOB, or low BP? Yes Did it involve sudden or severe rash/hives, skin peeling, or any reaction on the inside of your mouth or nose? Unknown Did you need to seek medical attention at a hospital or doctor's office? Yes When did it last happen?30 years ago If all above answers are NO, may proceed with cephalosporin use.     Review of Systems negative except from HPI and PMH  Physical Exam BP 104/62    Pulse 62    Ht 5\' 9"  (1.753 m)    Wt 178 lb 6.4 oz (80.9 kg)    SpO2 96%    BMI 26.35 kg/m  Well developed and well nourished in no acute distress HENT normal Neck supple   Clear decreased bilaterally Device pocket well healed; without hematoma or erythema.  There is no tethering  Regular rate and rhythm, no  murmur Abd-soft with active BS No Clubbing cyanosis bilateral right greater than left edema some erythema over the right shin Skin-warm and dry A & Oriented  Grossly normal sensory and motor function  ECG sinus rhythm with P synchronous pacing interspersed with AV pacing  Assessment and  Plan  Complete heart block  CAD  Prior MI  Pacemaker-St. Jude  DVT-recurrent  Daytime somnolence/obstructive nocturnal breathing  DOE  High Risk Medication Surveillance  Atrial fibrillation/flutter-persistent  Atrial fibrillation has spontaneously  reverted.  We will decrease his amiodarone from 200--100 mg a day.  Surveillance laboratories were normal in March.  Mild edema.  We will have him change his furosemide from 40 daily--80 every other day.  I am somewhat worried about the erythema and warmth over the right distal shin where he had this bruise.  However, he says it is considerably better.  We will hold off on antibiotics.  His episodic chest pain could represent ischemia not withstanding his atypical nature.  We will get a Myoview.  Encouraged him to stop smoking.

## 2019-07-25 NOTE — Patient Instructions (Signed)
Medication Instructions:  Your physician has recommended you make the following change in your medication:   Amiodarone - 100mg  (200mg  1/2 tablet daily by mouth  Furosemide 80mg  (2 tablets) every other day    Labwork: None ordered.  Testing/Procedures: Your physician has requested that you have a lexiscan myoview. For further information please visit [email or web address]. Please follow instruction sheet, as given.   Follow-Up: Your physician wants you to follow-up in: 3 months with Dr Caryl Comes. You will receive a reminder letter in the mail two months in advance. If you don't receive a letter, please call our office to schedule the follow-up appointment.  Remote monitoring is used to monitor your Pacemaker of ICD from home. This monitoring reduces the number of office visits required to check your device to one time per year. It allows [place] to keep an eye on the functioning of your device to ensure it is working properly.   Any Other Special Instructions Will Be Listed Below (If Applicable).  If you need a refill on your cardiac medications before your next appointment, please call your pharmacy.

## 2019-07-26 ENCOUNTER — Telehealth (HOSPITAL_COMMUNITY): Payer: Self-pay | Admitting: *Deleted

## 2019-07-26 NOTE — Telephone Encounter (Signed)
Patient given detailed instructions per Myocardial Perfusion Study Information Sheet for the test on 08/01/19. Patient notified to arrive 15 minutes early and that it is imperative to arrive on time for appointment to keep from having the test rescheduled.  If you need to cancel or reschedule your appointment, please call the office within 24 hours of your appointment. . Patient verbalized understanding. Lance Smith \   

## 2019-08-01 ENCOUNTER — Other Ambulatory Visit: Payer: Self-pay

## 2019-08-01 ENCOUNTER — Ambulatory Visit (HOSPITAL_COMMUNITY): Payer: Medicare Other | Attending: Cardiovascular Disease

## 2019-08-01 DIAGNOSIS — I5032 Chronic diastolic (congestive) heart failure: Secondary | ICD-10-CM | POA: Diagnosis not present

## 2019-08-01 DIAGNOSIS — R079 Chest pain, unspecified: Secondary | ICD-10-CM | POA: Diagnosis not present

## 2019-08-01 LAB — MYOCARDIAL PERFUSION IMAGING
LV dias vol: 113 mL (ref 62–150)
LV sys vol: 63 mL
Peak HR: 75 {beats}/min
Rest HR: 63 {beats}/min
SDS: 2
SRS: 2
SSS: 4
TID: 1.21

## 2019-08-01 MED ORDER — TECHNETIUM TC 99M TETROFOSMIN IV KIT
30.5000 | PACK | Freq: Once | INTRAVENOUS | Status: AC | PRN
  Administered 2019-08-01: 30.5 via INTRAVENOUS
  Filled 2019-08-01: qty 31

## 2019-08-01 MED ORDER — REGADENOSON 0.4 MG/5ML IV SOLN
0.4000 mg | Freq: Once | INTRAVENOUS | Status: AC
Start: 2019-08-01 — End: 2019-08-01
  Administered 2019-08-01: 0.4 mg via INTRAVENOUS

## 2019-08-01 MED ORDER — TECHNETIUM TC 99M TETROFOSMIN IV KIT
9.8000 | PACK | Freq: Once | INTRAVENOUS | Status: AC | PRN
  Administered 2019-08-01: 9.8 via INTRAVENOUS
  Filled 2019-08-01: qty 10

## 2019-08-08 ENCOUNTER — Telehealth: Payer: Self-pay | Admitting: Internal Medicine

## 2019-08-08 DIAGNOSIS — I480 Paroxysmal atrial fibrillation: Secondary | ICD-10-CM

## 2019-08-08 DIAGNOSIS — I2510 Atherosclerotic heart disease of native coronary artery without angina pectoris: Secondary | ICD-10-CM

## 2019-08-08 DIAGNOSIS — I252 Old myocardial infarction: Secondary | ICD-10-CM

## 2019-08-08 DIAGNOSIS — I5032 Chronic diastolic (congestive) heart failure: Secondary | ICD-10-CM

## 2019-08-08 NOTE — Telephone Encounter (Signed)
Spoke with pt's wife, DPR and advised test results show no ischemia however pt does have some reduced heart muscle function and will need an echo to confirm this. Pt's wife verbalizes understanding and agrees with current plan. Echo order placed.

## 2019-08-08 NOTE — Telephone Encounter (Signed)
Wife of the patient called. She got a bill for the patient's test 08/01/19 but has not heard anything about the test results. She would like someone to go over those results with her before she pays the bill

## 2019-08-23 LAB — CUP PACEART INCLINIC DEVICE CHECK
Brady Statistic RA Percent Paced: 49 %
Brady Statistic RV Percent Paced: 99 %
Date Time Interrogation Session: 20210707153401
Implantable Lead Implant Date: 20130502
Implantable Lead Implant Date: 20130502
Implantable Lead Location: 753859
Implantable Lead Location: 753860
Implantable Pulse Generator Implant Date: 20130502
Lead Channel Pacing Threshold Amplitude: 0.75 V
Lead Channel Pacing Threshold Amplitude: 1 V
Lead Channel Pacing Threshold Pulse Width: 0.5 ms
Lead Channel Pacing Threshold Pulse Width: 0.5 ms
Lead Channel Sensing Intrinsic Amplitude: 2 mV
Pulse Gen Model: 2110
Pulse Gen Serial Number: 7321242

## 2019-08-27 ENCOUNTER — Ambulatory Visit (HOSPITAL_COMMUNITY): Payer: Medicare Other | Attending: Cardiology

## 2019-08-27 ENCOUNTER — Other Ambulatory Visit: Payer: Self-pay

## 2019-08-27 DIAGNOSIS — I2510 Atherosclerotic heart disease of native coronary artery without angina pectoris: Secondary | ICD-10-CM | POA: Diagnosis present

## 2019-08-27 DIAGNOSIS — I5032 Chronic diastolic (congestive) heart failure: Secondary | ICD-10-CM | POA: Insufficient documentation

## 2019-08-27 DIAGNOSIS — I480 Paroxysmal atrial fibrillation: Secondary | ICD-10-CM | POA: Diagnosis not present

## 2019-08-27 DIAGNOSIS — I252 Old myocardial infarction: Secondary | ICD-10-CM | POA: Insufficient documentation

## 2019-08-27 LAB — ECHOCARDIOGRAM COMPLETE
Area-P 1/2: 2.54 cm2
S' Lateral: 4.3 cm

## 2019-08-28 ENCOUNTER — Telehealth: Payer: Self-pay | Admitting: Internal Medicine

## 2019-08-28 NOTE — Telephone Encounter (Signed)
Lance Smith is calling requesting Lance Smith's Echo results.

## 2019-08-29 ENCOUNTER — Other Ambulatory Visit: Payer: Self-pay | Admitting: Cardiology

## 2019-08-30 MED ORDER — FUROSEMIDE 40 MG PO TABS: 80.0000 mg | ORAL_TABLET | ORAL | 2 refills | Status: DC

## 2019-08-30 NOTE — Telephone Encounter (Signed)
Spoke with pt's wife, Deneise Lever, [place] who reports pt  has a large amount of pitting edema in his left leg from the knee down.  Swelling does not resolve at night after sleeping.  Pt has been keeping leg elevated.  He does occasionally eat bacon and drinks 16oz of soda daily.  Pt's wife reports he drinks very little water.  Pt's wife reports no new SOB, no redness and leg does not feel hot to the touch.  She does report he is complaining of some pain today from the knee to toes without localization but she believes it is because of the swelling.  She also reports pt had some CP on Monday that resolved with nitro.  Pt has no active CP at this time.  Reviewed pt's Furosemide and Spironolactone.  The pt has not been taking Furosemide as instructed at his last OV with Dr Caryl Comes in July.  Pt was to begin taking 80mg  (2 tablets) every other day and he continues to take 40mg  1 tablet daily.  Pt's wife advised to continue to monitor pt's sodium intake, keep legs elevated as much as possible during the day when sitting.  Pt has had his Furosemide 40mg  this am.  Pt's wife advised to give pt another Furosemide 40mg  tablet now and then begin to give him Furosemide 80mg  (2 tablets) every other day. Advised if pt develops any redness, red streaks or leg feels hot to the touch, new SOB or active CP he will need to go to ED for immediate evaluation.  Pt's wife is to call the office if swelling worsens or does not improve.  Pt's wife verbalizes understanding and agrees with current plan.  Pt's wife advised pt's echo shows normal heart muscle function.  Pt's wife thanked Therapist, sports for call.

## 2019-08-30 NOTE — Telephone Encounter (Signed)
° °  Pt's wife calling back to follow up echo results. She also added that pt is having swelling on his left foot. She hope someone can call them today since she's been waiting for a callback for 3 days  Pt c/o swelling: STAT is pt has developed SOB within 24 hours  1) How much weight have you gained and in what time span? Not sure  2) If swelling, where is the swelling located? Left foot all the way up to his knee  3) Are you currently taking a fluid pill? Yes  4) Are you currently SOB? Pt has COPD  5) Do you have a log of your daily weights (if so, list)? Not sure  6) Have you gained 3 pounds in a day or 5 pounds in a week? Not sure  7) Have you traveled recently? No

## 2019-09-25 ENCOUNTER — Ambulatory Visit (INDEPENDENT_AMBULATORY_CARE_PROVIDER_SITE_OTHER): Payer: Medicare Other | Admitting: *Deleted

## 2019-09-25 DIAGNOSIS — I442 Atrioventricular block, complete: Secondary | ICD-10-CM | POA: Diagnosis not present

## 2019-09-26 LAB — CUP PACEART REMOTE DEVICE CHECK
Battery Remaining Longevity: 24 mo
Battery Remaining Percentage: 22 %
Battery Voltage: 2.78 V
Brady Statistic AP VP Percent: 47 %
Brady Statistic AP VS Percent: 1 %
Brady Statistic AS VP Percent: 53 %
Brady Statistic AS VS Percent: 1 %
Brady Statistic RA Percent Paced: 45 %
Brady Statistic RV Percent Paced: 99 %
Date Time Interrogation Session: 20210908120539
Implantable Lead Implant Date: 20130502
Implantable Lead Implant Date: 20130502
Implantable Lead Location: 753859
Implantable Lead Location: 753860
Implantable Pulse Generator Implant Date: 20130502
Lead Channel Impedance Value: 400 Ohm
Lead Channel Impedance Value: 450 Ohm
Lead Channel Pacing Threshold Amplitude: 0.75 V
Lead Channel Pacing Threshold Amplitude: 1 V
Lead Channel Pacing Threshold Pulse Width: 0.5 ms
Lead Channel Pacing Threshold Pulse Width: 0.5 ms
Lead Channel Sensing Intrinsic Amplitude: 12 mV
Lead Channel Sensing Intrinsic Amplitude: 4.6 mV
Lead Channel Setting Pacing Amplitude: 2 V
Lead Channel Setting Pacing Amplitude: 2.5 V
Lead Channel Setting Pacing Pulse Width: 0.5 ms
Lead Channel Setting Sensing Sensitivity: 4 mV
Pulse Gen Model: 2110
Pulse Gen Serial Number: 7321242

## 2019-09-27 NOTE — Progress Notes (Signed)
Remote pacemaker transmission.   

## 2019-10-22 ENCOUNTER — Ambulatory Visit (INDEPENDENT_AMBULATORY_CARE_PROVIDER_SITE_OTHER): Payer: Medicare Other | Admitting: Internal Medicine

## 2019-10-22 ENCOUNTER — Encounter: Payer: Self-pay | Admitting: Internal Medicine

## 2019-10-22 ENCOUNTER — Other Ambulatory Visit: Payer: Self-pay

## 2019-10-22 VITALS — BP 120/60 | HR 60 | Ht 69.0 in | Wt 181.0 lb

## 2019-10-22 DIAGNOSIS — I4891 Unspecified atrial fibrillation: Secondary | ICD-10-CM | POA: Diagnosis not present

## 2019-10-22 DIAGNOSIS — I442 Atrioventricular block, complete: Secondary | ICD-10-CM

## 2019-10-22 LAB — CUP PACEART INCLINIC DEVICE CHECK
Battery Remaining Longevity: 21 mo
Battery Voltage: 2.77 V
Brady Statistic RA Percent Paced: 46 %
Brady Statistic RV Percent Paced: 99.93 %
Date Time Interrogation Session: 20211004103900
Implantable Lead Implant Date: 20130502
Implantable Lead Implant Date: 20130502
Implantable Lead Location: 753859
Implantable Lead Location: 753860
Implantable Pulse Generator Implant Date: 20130502
Lead Channel Impedance Value: 400 Ohm
Lead Channel Impedance Value: 462.5 Ohm
Lead Channel Pacing Threshold Amplitude: 0.75 V
Lead Channel Pacing Threshold Amplitude: 0.75 V
Lead Channel Pacing Threshold Amplitude: 1 V
Lead Channel Pacing Threshold Amplitude: 1 V
Lead Channel Pacing Threshold Pulse Width: 0.5 ms
Lead Channel Pacing Threshold Pulse Width: 0.5 ms
Lead Channel Pacing Threshold Pulse Width: 0.5 ms
Lead Channel Pacing Threshold Pulse Width: 0.5 ms
Lead Channel Sensing Intrinsic Amplitude: 12 mV
Lead Channel Sensing Intrinsic Amplitude: 2.4 mV
Lead Channel Setting Pacing Amplitude: 2 V
Lead Channel Setting Pacing Amplitude: 2.5 V
Lead Channel Setting Pacing Pulse Width: 0.5 ms
Lead Channel Setting Sensing Sensitivity: 4 mV
Pulse Gen Model: 2110
Pulse Gen Serial Number: 7321242

## 2019-10-22 MED ORDER — TORSEMIDE 20 MG PO TABS: 40.0000 mg | ORAL_TABLET | ORAL | 3 refills | Status: DC

## 2019-10-22 NOTE — Patient Instructions (Addendum)
Medication Instructions:  Your physician has recommended you make the following change in your medication:   ** Stop your Furosemide  ** Begin Torsemide 20mg  - ( 40mg , 2 tablets) by mouth daily x 3 days then Torsemide 20mg   (40mg , 2 tablets) by mouth every other day.  *If you need a refill on your cardiac medications before your next appointment, please call your pharmacy*   Lab Work: None ordered.  If you have labs (blood work) drawn today and your tests are completely normal, you will receive your results only by: Marland Kitchen MyChart Message (if you have MyChart) OR . A paper copy in the mail If you have any lab test that is abnormal or we need to change your treatment, we will call you to review the results.   Testing/Procedures: None ordered.    Follow-Up: At [facility], you and your health needs are our priority.  As part of our continuing mission to provide you with exceptional heart care, we have created designated Provider Care Teams.  These Care Teams include your primary Cardiologist (physician) and Advanced Practice Providers (APPs -  Physician Assistants and Nurse Practitioners) who all work together to provide you with the care you need, when you need it.  We recommend signing up for the patient portal called "MyChart".  Sign up information is provided on this After Visit Summary.  MyChart is used to connect with patients for Virtual Visits (Telemedicine).  Patients are able to view lab/test results, encounter notes, upcoming appointments, etc.  Non-urgent messages can be sent to your provider as well.   To learn more about what you can do with MyChart, go to [email or web address].    Your next appointment:   6 month(s)  The format for your next appointment:   In Person  Provider:   Virl Axe, MD

## 2019-10-22 NOTE — Progress Notes (Signed)
Patient Care Team: Burnard Bunting, MD as PCP - General (Internal Medicine) Ladene Artist, MD as Consulting Physician (Gastroenterology) Deboraha Sprang, MD as Consulting Physician (Cardiology)   HPI  Lance Smith is a 86 y.o. male Seen following pacemaker implantation May 2013 for complete heart block with atrial fibrillation recurrent DVT anticoagulated with rivaroxaban.   Atrial fibrillation underwent cardioversion 5/20 supported by amiodarone.  Has not been able to maintain sinus rhythm and it was commented that he was going to be left on rate control; however, he has spontaneously converted to sinus rhythm 2/21  Continues to complain of edema,  Did not Respond to increased furosemide.  No chest pain, still smoking  Diet reviewed, no added salt, but plenty of ambient; vigorous fluid intake, but some what decreased  DATE TEST EF   5/13    Echo   55-65 %   6/15 Myoview   51 % fixed, medium-sized moderate basal inferior and inferoseptal perfusion defect and a fixed small, mild mid inferolateral perfusion defect.  9/20 Echo  55-60%   7/21 Myoview  44% No ischemia  8/21 Echo  60-65%         Date Cr K TSH LFTs Hgb  6/15 1.36    14.1  5/19  1.31    14.8  3/21 1.55 4.6 2.4 16 14.6  9/21 1.4 4.8 2.47 26        He is caring for his wife who is struggling with memory and seizures following brain surgery about 5 or 6 years ago and his son who is handicapped.  He is her main caregiver.     Past Medical History:  Diagnosis Date  . Adenomatous colon polyp 01/2002  . Arthritis   . CKD (chronic kidney disease) stage 3, GFR 30-59 ml/min (HCC)   . Complete heart block ([place])    a. 05/2011 s/p SJM Model [id number] Dual Chamber PPM  . COPD (chronic obstructive pulmonary disease) ([place])   . Diabetes mellitus ([place])   . Diastolic CHF ([place])    a. 06/5782 Echo: EF 55-60%  . DVT (deep venous thrombosis), unspecified laterality    a. previously on coumadin - d/c'd spring of 2013  .  Hyperglycemia   . Hyperlipidemia   . Hypertension   . Inferior myocardial infarction ([place]) 08/22/2013  . Steroid dependence ([place])   . Tobacco abuse     Past Surgical History:  Procedure Laterality Date  . bilateral inguinal hernia    . CARDIOVERSION N/A 06/16/2018   Procedure: CARDIOVERSION;  Surgeon: Fay Records, MD;  Location: [place];  Service: Cardiovascular;  Laterality: N/A;  . MELANOMA EXCISION    . PACEMAKER INSERTION  May 2013  . PERMANENT PACEMAKER INSERTION Left 05/20/2011   Procedure: PERMANENT PACEMAKER INSERTION;  Surgeon: Deboraha Sprang, MD;  Location: [facility] CATH LAB;  Service: Cardiovascular;  Laterality: Left;    Current Outpatient Medications  Medication Sig Dispense Refill  . furosemide (LASIX) 40 MG tablet Take 40 mg by mouth.    . metFORMIN (GLUCOPHAGE) 500 MG tablet Take 500 mg by mouth daily with breakfast.     . Multiple Vitamin (MULTIVITAMIN WITH MINERALS) TABS tablet Take 1 tablet by mouth daily. Centrum Silver    . neomycin-polymyxin-hydrocortisone (CORTISPORIN) 3.5-10000-1 OTIC suspension Place 4 drops into the right ear daily.    . nitroGLYCERIN (NITROSTAT) 0.4 MG SL tablet Place 0.4 mg under the tongue every 5 (five) minutes as needed. For chest pain    .  PACERONE 200 MG tablet TAKE 2 TABLETS BY MOUTH TWICE DAILY FOR 2 WEEKS AND 1 TWICE DAILY FOR 2 WEEKS AND 1 ONCE DAILY 90 tablet 2  . PROAIR HFA 108 (90 Base) MCG/ACT inhaler Inhale 2 puffs into the lungs See admin instructions. Inhale 2 puffs by mouth twice daily & every 4 hours as needed for wheezing or shortness of breath.    . Rivaroxaban (XARELTO) 15 MG TABS tablet Take 1 tablet (15 mg total) by mouth daily. 90 tablet 3  . simvastatin (ZOCOR) 40 MG tablet Take 40 mg by mouth every morning.     Marland Kitchen spironolactone (ALDACTONE) 25 MG tablet Take 0.5 tablets (12.5 mg total) by mouth daily. 90 tablet 2  . tamsulosin (FLOMAX) 0.4 MG CAPS capsule Take 0.4 mg by mouth daily.    Rhea Pink SINUS SEVERE 5-325-200  MG TABS Take 1 tablet by mouth daily as needed (sinus headache).     No current facility-administered medications for this visit.    Allergies  Allergen Reactions  . Tetanus Toxoids Shortness Of Breath and Swelling  . Aspirin Nausea Only and Other (See Comments)    headache  . Penicillins Swelling    Did it involve swelling of the face/tongue/throat, SOB, or low BP? Yes Did it involve sudden or severe rash/hives, skin peeling, or any reaction on the inside of your mouth or nose? Unknown Did you need to seek medical attention at a hospital or doctor's office? Yes When did it last happen?30 years ago If all above answers are "NO", may proceed with cephalosporin use.     Review of Systems negative except from HPI and PMH  Physical Exam BP 120/60   Pulse 60   Ht 5\' 9"  (1.753 m)   Wt 181 lb (82.1 kg)   SpO2 93%   BMI 26.73 kg/m  Well developed and well nourished in no acute distress HENT normal Neck supple with JVP-flat Clear Device pocket well healed; without hematoma or erythema.  There is no tethering  Regular rate and rhythm, no  murmur Abd-soft with active BS No Clubbing cyanosis 2+edema Skin-warm and dry A & Oriented  Grossly normal sensory and motor function     Assessment and  Plan  Complete heart block  CAD  Prior MI  Pacemaker-St. Jude The patient's device was interrogated.  The information was reviewed. No changes were made in the programming.     DVT-recurrent  Daytime somnolence/obstructive nocturnal breathing  DOE  CHF chronic diastolic  High Risk Medication Surveillance-amiodarone/spironolactone  Atrial fibrillation/flutter-persistent  No intercurrent atrial fibrillation or flutter  Holding sinus on amio; amiodarone surveillance laboratories normal 9/21  Volume overloaded.  Some asymmetry probably related to recurrent DVT.  We will try again with torsemide and see if we can make a difference.  Without symptoms of ischemia

## 2019-12-25 ENCOUNTER — Ambulatory Visit (INDEPENDENT_AMBULATORY_CARE_PROVIDER_SITE_OTHER): Payer: Medicare Other

## 2019-12-25 DIAGNOSIS — I442 Atrioventricular block, complete: Secondary | ICD-10-CM | POA: Diagnosis not present

## 2019-12-26 LAB — CUP PACEART REMOTE DEVICE CHECK
Battery Remaining Longevity: 22 mo
Battery Remaining Percentage: 19 %
Battery Voltage: 2.77 V
Brady Statistic AP VP Percent: 33 %
Brady Statistic AP VS Percent: 1 %
Brady Statistic AS VP Percent: 66 %
Brady Statistic AS VS Percent: 1 %
Brady Statistic RA Percent Paced: 31 %
Brady Statistic RV Percent Paced: 99 %
Date Time Interrogation Session: 20211208094550
Implantable Lead Implant Date: 20130502
Implantable Lead Implant Date: 20130502
Implantable Lead Location: 753859
Implantable Lead Location: 753860
Implantable Pulse Generator Implant Date: 20130502
Lead Channel Impedance Value: 400 Ohm
Lead Channel Impedance Value: 450 Ohm
Lead Channel Pacing Threshold Amplitude: 0.75 V
Lead Channel Pacing Threshold Amplitude: 1 V
Lead Channel Pacing Threshold Pulse Width: 0.5 ms
Lead Channel Pacing Threshold Pulse Width: 0.5 ms
Lead Channel Sensing Intrinsic Amplitude: 12 mV
Lead Channel Sensing Intrinsic Amplitude: 4.2 mV
Lead Channel Setting Pacing Amplitude: 2 V
Lead Channel Setting Pacing Amplitude: 2.5 V
Lead Channel Setting Pacing Pulse Width: 0.5 ms
Lead Channel Setting Sensing Sensitivity: 4 mV
Pulse Gen Model: 2110
Pulse Gen Serial Number: 7321242

## 2020-01-07 NOTE — Progress Notes (Signed)
Remote pacemaker transmission.   

## 2020-03-10 ENCOUNTER — Other Ambulatory Visit: Payer: Self-pay | Admitting: Student

## 2020-03-25 ENCOUNTER — Ambulatory Visit (INDEPENDENT_AMBULATORY_CARE_PROVIDER_SITE_OTHER): Payer: Medicare Other

## 2020-03-25 DIAGNOSIS — I442 Atrioventricular block, complete: Secondary | ICD-10-CM

## 2020-03-25 LAB — CUP PACEART REMOTE DEVICE CHECK
Battery Remaining Longevity: 16 mo
Battery Remaining Percentage: 15 %
Battery Voltage: 2.74 V
Brady Statistic AP VP Percent: 27 %
Brady Statistic AP VS Percent: 1 %
Brady Statistic AS VP Percent: 72 %
Brady Statistic AS VS Percent: 1 %
Brady Statistic RA Percent Paced: 26 %
Brady Statistic RV Percent Paced: 99 %
Date Time Interrogation Session: 20220308101640
Implantable Lead Implant Date: 20130502
Implantable Lead Implant Date: 20130502
Implantable Lead Location: 753859
Implantable Lead Location: 753860
Implantable Pulse Generator Implant Date: 20130502
Lead Channel Impedance Value: 400 Ohm
Lead Channel Impedance Value: 450 Ohm
Lead Channel Pacing Threshold Amplitude: 0.75 V
Lead Channel Pacing Threshold Amplitude: 1 V
Lead Channel Pacing Threshold Pulse Width: 0.5 ms
Lead Channel Pacing Threshold Pulse Width: 0.5 ms
Lead Channel Sensing Intrinsic Amplitude: 12 mV
Lead Channel Sensing Intrinsic Amplitude: 4.9 mV
Lead Channel Setting Pacing Amplitude: 2 V
Lead Channel Setting Pacing Amplitude: 2.5 V
Lead Channel Setting Pacing Pulse Width: 0.5 ms
Lead Channel Setting Sensing Sensitivity: 4 mV
Pulse Gen Model: 2110
Pulse Gen Serial Number: 7321242

## 2020-04-02 NOTE — Progress Notes (Signed)
Remote pacemaker transmission.   

## 2020-06-04 ENCOUNTER — Encounter (INDEPENDENT_AMBULATORY_CARE_PROVIDER_SITE_OTHER): Payer: Self-pay

## 2020-06-04 ENCOUNTER — Other Ambulatory Visit: Payer: Self-pay

## 2020-06-04 ENCOUNTER — Ambulatory Visit (INDEPENDENT_AMBULATORY_CARE_PROVIDER_SITE_OTHER)
Admission: RE | Admit: 2020-06-04 | Discharge: 2020-06-04 | Disposition: A | Discharge status: Home / Self Care | Payer: Medicare Other | Source: Ambulatory Visit | Attending: Internal Medicine | Admitting: Internal Medicine

## 2020-06-04 ENCOUNTER — Ambulatory Visit: Payer: Medicare Other | Admitting: Internal Medicine

## 2020-06-04 ENCOUNTER — Encounter: Payer: Self-pay | Admitting: Internal Medicine

## 2020-06-04 VITALS — BP 118/60 | HR 69 | Ht 69.0 in | Wt 166.8 lb

## 2020-06-04 DIAGNOSIS — I480 Paroxysmal atrial fibrillation: Secondary | ICD-10-CM

## 2020-06-04 DIAGNOSIS — I442 Atrioventricular block, complete: Secondary | ICD-10-CM

## 2020-06-04 DIAGNOSIS — Z79899 Other long term (current) drug therapy: Secondary | ICD-10-CM

## 2020-06-04 DIAGNOSIS — M549 Dorsalgia, unspecified: Secondary | ICD-10-CM | POA: Diagnosis not present

## 2020-06-04 DIAGNOSIS — E032 Hypothyroidism due to medicaments and other exogenous substances: Secondary | ICD-10-CM

## 2020-06-04 DIAGNOSIS — R079 Chest pain, unspecified: Secondary | ICD-10-CM | POA: Diagnosis not present

## 2020-06-04 DIAGNOSIS — R778 Other specified abnormalities of plasma proteins: Secondary | ICD-10-CM

## 2020-06-04 DIAGNOSIS — R7989 Other specified abnormal findings of blood chemistry: Secondary | ICD-10-CM

## 2020-06-04 LAB — BASIC METABOLIC PANEL
BUN/Creatinine Ratio: 25 — ABNORMAL HIGH (ref 10–24)
BUN: 28 mg/dL — ABNORMAL HIGH (ref 8–27)
CO2: 25 mmol/L (ref 20–29)
Calcium: 9.2 mg/dL (ref 8.6–10.2)
Chloride: 101 mmol/L (ref 96–106)
Creatinine, Ser: 1.1 mg/dL (ref 0.76–1.27)
Glucose: 153 mg/dL — ABNORMAL HIGH (ref 65–99)
Potassium: 4.5 mmol/L (ref 3.5–5.2)
Sodium: 137 mmol/L (ref 134–144)
eGFR: 65 mL/min/{1.73_m2} (ref >59)

## 2020-06-04 LAB — HEPATIC FUNCTION PANEL
ALT: 19 IU/L (ref 0–44)
AST: 17 IU/L (ref 0–40)
Albumin: 3.9 g/dL (ref 3.6–4.6)
Alkaline Phosphatase: 90 IU/L (ref 44–121)
Bilirubin Total: 0.4 mg/dL (ref 0.0–1.2)
Bilirubin, Direct: 0.13 mg/dL (ref 0.00–0.40)
Total Protein: 6.5 g/dL (ref 6.0–8.5)

## 2020-06-04 LAB — TROPONIN I (HIGH SENSITIVITY): Troponin I (High Sensitivity): 43 ng/L — ABNORMAL HIGH (ref <18)

## 2020-06-04 LAB — TSH: TSH: 0.881 u[IU]/mL (ref 0.450–4.500)

## 2020-06-04 MED ORDER — IOHEXOL 350 MG/ML SOLN
100.0000 mL | Freq: Once | INTRAVENOUS | Status: AC | PRN
  Administered 2020-06-04: 100 mL via INTRAVENOUS

## 2020-06-04 MED ORDER — CLOPIDOGREL BISULFATE 75 MG PO TABS: 75.0000 mg | ORAL_TABLET | Freq: Every day | ORAL | 3 refills | Status: AC

## 2020-06-04 NOTE — Patient Instructions (Addendum)
Medication Instructions:  Your physician has recommended you make the following change in your medication:   ** Begin taking Plavix  *If you need a refill on your cardiac medications before your next appointment, please call your pharmacy*   Lab Work: BMET, TSH, Liver panel. Troponin If you have labs (blood work) drawn today and your tests are completely normal, you will receive your results only by: Marland Kitchen MyChart Message (if you have MyChart) OR . A paper copy in the mail If you have any lab test that is abnormal or we need to change your treatment, we will call you to review the results.   Testing/Procedures: CT of the Aorta    Follow-Up: At [facility], you and your health needs are our priority.  As part of our continuing mission to provide you with exceptional heart care, we have created designated Provider Care Teams.  These Care Teams include your primary Cardiologist (physician) and Advanced Practice Providers (APPs -  Physician Assistants and Nurse Practitioners) who all work together to provide you with the care you need, when you need it.  We recommend signing up for the patient portal called "MyChart".  Sign up information is provided on this After Visit Summary.  MyChart is used to connect with patients for Virtual Visits (Telemedicine).  Patients are able to view lab/test results, encounter notes, upcoming appointments, etc.  Non-urgent messages can be sent to your provider as well.   To learn more about what you can do with MyChart, go to [email or web address].    Your next appointment:   Return Friday 06/06/2020 for repeat labs

## 2020-06-04 NOTE — Progress Notes (Signed)
Patient Care Team: Burnard Bunting, MD as PCP - General (Internal Medicine) Ladene Artist, MD as Consulting Physician (Gastroenterology) Deboraha Sprang, MD as Consulting Physician (Cardiology)   HPI  [facility] is a 87 y.o. male Seen following pacemaker implantation May 2013 for complete heart block with atrial fibrillation recurrent DVT anticoagulated with rivaroxaban.   Atrial fibrillation underwent cardioversion 5/20 supported by amiodarone.  Has not been able to maintain sinus rhythm and it was commented that he was going to be left on rate control; however, he has spontaneously converted to sinus rhythm 2/21  The patient denies nocturnal dyspnea, orthopne.  There have been no palpitations, lightheadedness or syncope.  Complains of pain on Saturday that started in his mid chest and went through to his back.  It was crescendo over the span of about 6 hours.  He thought it was indigestion.  Although he denied a brackish taste.  His wife thought he might be having a heart attack.  It was associate with shortness of breath but no diaphoresis.  He is noted no change overall in his exertional dyspnea.  His sugars have been unstable.  I called his PCPs office; no blood work since 9/21.      DATE TEST EF   5/13    Echo   55-65 %   6/15 Myoview   51 % fixed, medium-sized moderate basal inferior and inferoseptal perfusion defect and a fixed small, mild mid inferolateral perfusion defect.  9/20 Echo  55-60%   7/21 Myoview  44% No ischemia  8/21 Echo  60-65%         Date Cr K TSH LFTs Hgb  6/15 1.36    14.1  5/19  1.31    14.8  3/21 1.55 4.6 2.4 16 14.6  9/21 1.4 4.8 2.47 26               He is caring for his wife who is struggling with memory and seizures following brain surgery about 5 or 6 years ago and his son who is handicapped.  He is her main caregiver.     Past Medical History:  Diagnosis Date  . Adenomatous colon polyp 01/2002  . Arthritis   . CKD  (chronic kidney disease) stage 3, GFR 30-59 ml/min (HCC)   . Complete heart block ([place])    a. 05/2011 s/p SJM Model [id number] Dual Chamber PPM  . COPD (chronic obstructive pulmonary disease) ([place])   . Diabetes mellitus ([place])   . Diastolic CHF ([place])    a. 05/4006 Echo: EF 55-60%  . DVT (deep venous thrombosis), unspecified laterality    a. previously on coumadin - d/c'd spring of 2013  . Hyperglycemia   . Hyperlipidemia   . Hypertension   . Inferior myocardial infarction ([place]) 08/22/2013  . Steroid dependence ([place])   . Tobacco abuse     Past Surgical History:  Procedure Laterality Date  . bilateral inguinal hernia    . CARDIOVERSION N/A 06/16/2018   Procedure: CARDIOVERSION;  Surgeon: Fay Records, MD;  Location: [facility];  Service: Cardiovascular;  Laterality: N/A;  . MELANOMA EXCISION    . PACEMAKER INSERTION  May 2013  . PERMANENT PACEMAKER INSERTION Left 05/20/2011   Procedure: PERMANENT PACEMAKER INSERTION;  Surgeon: Deboraha Sprang, MD;  Location: [facility] CATH LAB;  Service: Cardiovascular;  Laterality: Left;    Current Outpatient Medications  Medication Sig Dispense Refill  . metFORMIN (GLUCOPHAGE) 500 MG tablet Take 500  mg by mouth daily with breakfast.     . Multiple Vitamin (MULTIVITAMIN WITH MINERALS) TABS tablet Take 1 tablet by mouth daily. Centrum Silver    . neomycin-polymyxin-hydrocortisone (CORTISPORIN) 3.5-10000-1 OTIC suspension Place 4 drops into the right ear daily.    . nitroGLYCERIN (NITROSTAT) 0.4 MG SL tablet Place 0.4 mg under the tongue every 5 (five) minutes as needed. For chest pain    . PACERONE 200 MG tablet TAKE 2 TABLETS BY MOUTH TWICE DAILY FOR 2 WEEKS AND 1 TWICE DAILY FOR 2 WEEKS AND 1 ONCE DAILY 90 tablet 2  . PROAIR HFA 108 (90 Base) MCG/ACT inhaler Inhale 2 puffs into the lungs See admin instructions. Inhale 2 puffs by mouth twice daily & every 4 hours as needed for wheezing or shortness of breath.    . Rivaroxaban (XARELTO) 15 MG TABS tablet Take 1  tablet (15 mg total) by mouth daily. 90 tablet 3  . simvastatin (ZOCOR) 40 MG tablet Take 40 mg by mouth every morning.     . tamsulosin (FLOMAX) 0.4 MG CAPS capsule Take 0.4 mg by mouth daily.    Marland Kitchen torsemide (DEMADEX) 20 MG tablet Take 2 tablets (40 mg total) by mouth every other day. Take 40mg  -  2  tablets daily by mouth x 3 days then 40mg  - 2 tablets by mouth every other day. 90 tablet 3  . TYLENOL SINUS SEVERE 5-325-200 MG TABS Take 1 tablet by mouth daily as needed (sinus headache).     No current facility-administered medications for this visit.    Allergies  Allergen Reactions  . Tetanus Toxoids Shortness Of Breath and Swelling  . Aspirin Nausea Only and Other (See Comments)    headache  . Penicillins Swelling    Did it involve swelling of the face/tongue/throat, SOB, or low BP? Yes Did it involve sudden or severe rash/hives, skin peeling, or any reaction on the inside of your mouth or nose? Unknown Did you need to seek medical attention at a hospital or doctor's office? Yes When did it last happen?30 years ago If all above answers are "NO", may proceed with cephalosporin use.     Review of Systems negative except from HPI and PMH  Physical Exam BP 118/60   Pulse 69   Ht 5\' 9"  (1.753 m)   Wt 166 lb 12.8 oz (75.7 kg)   SpO2 99%   BMI 24.63 kg/m  Well developed and well nourished in no acute distress HENT normal Neck supple with JVP-flat Clear Device pocket well healed; without hematoma or erythema.  There is no tethering  Regular rate and rhythm,  murmur Abd-soft with active BS No Clubbing cyanosis 2+ edema Skin-warm and dry A & Oriented  Grossly normal sensory and motor function  ECG sinus with P synchronous pacing  Assessment and  Plan  Complete heart block  CAD  Prior MI  Pacemaker-St. Jude     DVT-recurrent  Daytime somnolence/obstructive nocturnal breathing  CHF chronic diastolic  High Risk Medication  Surveillance-amiodarone/spironolactone  Atrial fibrillation/flutter-persistent  Chest pain that he get his device interrogated his device.  No intercurrent atrial fibrillation or flutter of note;  Continue amio  daily; Needs surveillance labs but have to confirm that he is still taking it  On Anticoagulant for thromboembolic risk reduction.  No bleeding issues.  Continue rivaroxaban at 15 dosed appropriately off of his GFR.    Pt heart failure status is stable. Continue torsemide at at current  Dose of 40 twice daily.  Electrolytes have not been evaluated since a fall.  His unstable blood sugar may be concerned that his renal function is changing.  The chest pain is of concern for both possible aortic dissection as well as myocardial infarction..  We will check a troponin.  The issue of a possible aortic dissection is perhaps a little lower given the fact that the pain was crescendo.  We will need to look at his aorta.  He fits emergent criteria for contrast use>> scan completed and no evidence of dissection  His troponin is elevated.  Not strikingly but looking at data sets, certainly consistent with a prior event.  We will check a second sample in about 48 hours and a significant decrease would support an interval myocardial infarction.  Pending this, we will put him on plavix 150 then 75 qd as ASA intolerant  Total of 75 minutes spent over the last 4 + hours

## 2020-06-06 ENCOUNTER — Other Ambulatory Visit: Payer: Self-pay

## 2020-06-06 ENCOUNTER — Other Ambulatory Visit: Payer: Medicare Other | Admitting: *Deleted

## 2020-06-06 DIAGNOSIS — R079 Chest pain, unspecified: Secondary | ICD-10-CM

## 2020-06-06 DIAGNOSIS — R778 Other specified abnormalities of plasma proteins: Secondary | ICD-10-CM

## 2020-06-06 LAB — TROPONIN I (HIGH SENSITIVITY): Troponin I (High Sensitivity): 45 ng/L — ABNORMAL HIGH (ref <18)

## 2020-06-09 ENCOUNTER — Telehealth: Payer: Self-pay | Admitting: Internal Medicine

## 2020-06-09 NOTE — Telephone Encounter (Signed)
Follow Up:      Pt is calling o see if his lab results are ready from last week please?

## 2020-06-09 NOTE — Telephone Encounter (Signed)
Spoke with pt and pt's wife, DPR and advised Marlou Porch has reviewed pt's last Troponin and has no further recommendations at this time but once Dr Caryl Comes reviews if he has any further instructions will contact pt.  Pt states he has not yet started Plavix but will pick up medications and start today.  Pt's wife states pt continues to complain of chest and back pain.  Advised pt's wife to also contact pt's PCP re: chest and back pain.  Will await further review and recommendation from Dr Caryl Comes.

## 2020-06-19 ENCOUNTER — Emergency Department (HOSPITAL_COMMUNITY)
Admission: EM | Admit: 2020-06-19 | Discharge: 2020-06-19 | Disposition: A | Discharge status: Home / Self Care | Payer: Medicare Other | Attending: Emergency Medicine | Admitting: Emergency Medicine

## 2020-06-19 ENCOUNTER — Encounter (HOSPITAL_COMMUNITY): Payer: Self-pay

## 2020-06-19 ENCOUNTER — Emergency Department (HOSPITAL_COMMUNITY): Payer: Medicare Other

## 2020-06-19 ENCOUNTER — Other Ambulatory Visit: Payer: Self-pay

## 2020-06-19 DIAGNOSIS — E1122 Type 2 diabetes mellitus with diabetic chronic kidney disease: Secondary | ICD-10-CM | POA: Insufficient documentation

## 2020-06-19 DIAGNOSIS — F17210 Nicotine dependence, cigarettes, uncomplicated: Secondary | ICD-10-CM | POA: Diagnosis not present

## 2020-06-19 DIAGNOSIS — Z7902 Long term (current) use of antithrombotics/antiplatelets: Secondary | ICD-10-CM | POA: Diagnosis not present

## 2020-06-19 DIAGNOSIS — J449 Chronic obstructive pulmonary disease, unspecified: Secondary | ICD-10-CM | POA: Diagnosis not present

## 2020-06-19 DIAGNOSIS — I130 Hypertensive heart and chronic kidney disease with heart failure and stage 1 through stage 4 chronic kidney disease, or unspecified chronic kidney disease: Secondary | ICD-10-CM | POA: Diagnosis not present

## 2020-06-19 DIAGNOSIS — I2510 Atherosclerotic heart disease of native coronary artery without angina pectoris: Secondary | ICD-10-CM | POA: Insufficient documentation

## 2020-06-19 DIAGNOSIS — Z950 Presence of cardiac pacemaker: Secondary | ICD-10-CM | POA: Diagnosis not present

## 2020-06-19 DIAGNOSIS — M549 Dorsalgia, unspecified: Secondary | ICD-10-CM | POA: Insufficient documentation

## 2020-06-19 DIAGNOSIS — Z7984 Long term (current) use of oral hypoglycemic drugs: Secondary | ICD-10-CM | POA: Insufficient documentation

## 2020-06-19 DIAGNOSIS — R079 Chest pain, unspecified: Secondary | ICD-10-CM | POA: Diagnosis present

## 2020-06-19 DIAGNOSIS — Z7901 Long term (current) use of anticoagulants: Secondary | ICD-10-CM | POA: Diagnosis not present

## 2020-06-19 DIAGNOSIS — I5030 Unspecified diastolic (congestive) heart failure: Secondary | ICD-10-CM | POA: Diagnosis not present

## 2020-06-19 DIAGNOSIS — N183 Chronic kidney disease, stage 3 unspecified: Secondary | ICD-10-CM | POA: Diagnosis not present

## 2020-06-19 LAB — BASIC METABOLIC PANEL
Anion gap: 8 (ref 5–15)
BUN: 32 mg/dL — ABNORMAL HIGH (ref 8–23)
CO2: 26 mmol/L (ref 22–32)
Calcium: 8.7 mg/dL — ABNORMAL LOW (ref 8.9–10.3)
Chloride: 101 mmol/L (ref 98–111)
Creatinine, Ser: 1.34 mg/dL — ABNORMAL HIGH (ref 0.61–1.24)
GFR, Estimated: 51 mL/min — ABNORMAL LOW (ref >60)
Glucose, Bld: 179 mg/dL — ABNORMAL HIGH (ref 70–99)
Potassium: 4 mmol/L (ref 3.5–5.1)
Sodium: 135 mmol/L (ref 135–145)

## 2020-06-19 LAB — CBC WITH DIFFERENTIAL/PLATELET
Abs Immature Granulocytes: 0.09 10*3/uL — ABNORMAL HIGH (ref 0.00–0.07)
Basophils Absolute: 0 10*3/uL (ref 0.0–0.1)
Basophils Relative: 0 %
Eosinophils Absolute: 0 10*3/uL (ref 0.0–0.5)
Eosinophils Relative: 0 %
HCT: 41.7 % (ref 39.0–52.0)
Hemoglobin: 13.7 g/dL (ref 13.0–17.0)
Immature Granulocytes: 1 %
Lymphocytes Relative: 9 %
Lymphs Abs: 1.1 10*3/uL (ref 0.7–4.0)
MCH: 35.1 pg — ABNORMAL HIGH (ref 26.0–34.0)
MCHC: 32.9 g/dL (ref 30.0–36.0)
MCV: 106.9 fL — ABNORMAL HIGH (ref 80.0–100.0)
Monocytes Absolute: 0.7 10*3/uL (ref 0.1–1.0)
Monocytes Relative: 6 %
Neutro Abs: 9.9 10*3/uL — ABNORMAL HIGH (ref 1.7–7.7)
Neutrophils Relative %: 84 %
Platelets: 253 10*3/uL (ref 150–400)
RBC: 3.9 MIL/uL — ABNORMAL LOW (ref 4.22–5.81)
RDW: 13.2 % (ref 11.5–15.5)
WBC: 11.9 10*3/uL — ABNORMAL HIGH (ref 4.0–10.5)
nRBC: 0 % (ref 0.0–0.2)

## 2020-06-19 LAB — BRAIN NATRIURETIC PEPTIDE: B Natriuretic Peptide: 294.6 pg/mL — ABNORMAL HIGH (ref 0.0–100.0)

## 2020-06-19 LAB — TROPONIN I (HIGH SENSITIVITY)
Troponin I (High Sensitivity): 37 ng/L — ABNORMAL HIGH (ref <18)
Troponin I (High Sensitivity): 37 ng/L — ABNORMAL HIGH (ref <18)

## 2020-06-19 MED ORDER — FENTANYL CITRATE (PF) 100 MCG/2ML IJ SOLN
50.0000 ug | Freq: Once | INTRAMUSCULAR | Status: AC
  Administered 2020-06-19: 50 ug via INTRAVENOUS
  Filled 2020-06-19: qty 2

## 2020-06-19 NOTE — ED Triage Notes (Signed)
Pt from home BIB EMS for c/o intermittent CP x 1 mo. Pt has St.Jude pacemaker. Last interrogated in April with normal results. Pt compliant with meds. Pt reports fluctuations in BGL and some increased swelling in BLE and ABD.

## 2020-06-19 NOTE — ED Notes (Signed)
St. Jude Interrogation report called (fax to follow): EDP notified: Dual chamber pacemaker, normal function, 11 months left on battery, no recent arrhythmias, last was atrial tach on 5/25, occ PACs.

## 2020-06-19 NOTE — ED Provider Notes (Signed)
  Physical Exam  BP 118/85   Pulse (!) 59   Temp 98 F (36.7 C)   Resp 17   SpO2 96%   Physical Exam  ED Course/Procedures     Procedures  MDM  Patient presents with chest pain.  Received in signout.  Similar pain has been having for a while now.  Seen cardiology for it.  Has had CTA that was reassuring.  BNP is mildly elevated with troponin slightly above normal but has had for different measurements now at the same level including cardiology.  I think this is his baseline.  X-ray reassuring.  Still slight dull pain.  States he is worried because his sugar goes up and down at times.  Will need to follow-up with his PCP for this.  Follow-up with cardiology for the chest pain.  Doubt cardiac ischemia.  Discharge home       Davonna Belling, MD 06/19/20 1759

## 2020-06-19 NOTE — ED Provider Notes (Signed)
[facility] Provider Note   CSN: [id number] Arrival date & time: 06/19/20  1324     History Chief Complaint  Patient presents with  . Chest Pain    Lance Smith is a 87 y.o. male.  History of CKD, COPD, complete heart block status post pacemaker placement, DVT on Xarelto presenting to ER with concern for chest pain.  Has been having chest pain for the past 6 weeks.  Relatively constant.  No alleviating or aggravating factors identified.  Not associated with exertion.  Does have some associated back pain.  He was evaluated on 5/18 in the cardiology clinic.  CT angio chest abdomen pelvis was obtained and was negative for aortic dissection, aneurysm or other explanation for chest pain.  Troponin was mildly elevated but no delta troponin.  Cardiologist had recommended starting back on Plavix however patient has not started taking this medication.  Patient reports that he is concerned about side effects and does not want to start an additional medicine that would thin his blood in addition to his existing Xarelto.  Symptoms are relatively unchanged since he was last evaluated.  Currently pain is 4 out of 10 in severity.  Dull and achy.  HPI     Past Medical History:  Diagnosis Date  . Adenomatous colon polyp 01/2002  . Arthritis   . CKD (chronic kidney disease) stage 3, GFR 30-59 ml/min (HCC)   . Complete heart block ([facility])    a. 05/2011 s/p SJM Model [id number] Dual Chamber PPM  . COPD (chronic obstructive pulmonary disease) ([facility])   . Diabetes mellitus ([facility])   . Diastolic CHF ([facility])    a. 04/4008 Echo: EF 55-60%  . DVT (deep venous thrombosis), unspecified laterality    a. previously on coumadin - d/c'd spring of 2013  . Hyperglycemia   . Hyperlipidemia   . Hypertension   . Inferior myocardial infarction ([facility]) 08/22/2013  . Steroid dependence ([facility])   . Tobacco abuse     Patient Active Problem List   Diagnosis Date Noted  . CAD (coronary artery  disease),Prior IMI  08/22/2013  . Inferior myocardial infarction ([place]) 08/22/2013  . Hx of adenomatous colonic polyps 07/16/2013  . Atrial fibrillation ([place]) 06/02/2012  . Diastolic heart failure ([place]) 06/29/2011  . Hyperglycemia 05/24/2011  . Urinary retention 05/21/2011  . Steroid dependence ([facility]) 05/21/2011  . Pacemaker-St.Jude 05/21/2011  . Complete heart block ([facility]) 05/20/2011  . Tobacco abuse 05/20/2011    Past Surgical History:  Procedure Laterality Date  . bilateral inguinal hernia    . CARDIOVERSION N/A 06/16/2018   Procedure: CARDIOVERSION;  Surgeon: Lance Records, MD;  Location: [facility];  Service: Cardiovascular;  Laterality: N/A;  . MELANOMA EXCISION    . PACEMAKER INSERTION  May 2013  . PERMANENT PACEMAKER INSERTION Left 05/20/2011   Procedure: PERMANENT PACEMAKER INSERTION;  Surgeon: Lance Sprang, MD;  Location[facility] CATH LAB;  Service: Cardiovascular;  Laterality: Left;       Family History  Problem Relation Age of Onset  . Leukemia Mother   . Colon cancer Neg Hx     Social History   Tobacco Use  . Smoking status: Current Every Day Smoker    Packs/day: 2.00    Types: Cigarettes  . Smokeless tobacco: Never Used  Vaping Use  . Vaping Use: Never used  Substance Use Topics  . Alcohol use: No    Alcohol/week: 0.0 standard drinks  . Drug use: No  Home Medications Prior to Admission medications   Medication Sig Start Date End Date Taking? Authorizing Provider  clopidogrel (PLAVIX) 75 MG tablet Take 1 tablet (75 mg total) by mouth daily. 06/04/20   Lance Sprang, MD  metFORMIN (GLUCOPHAGE) 500 MG tablet Take 500 mg by mouth daily with breakfast.  07/13/15   [provider]  Multiple Vitamin (MULTIVITAMIN WITH MINERALS) TABS tablet Take 1 tablet by mouth daily. [facility]    [provider]  neomycin-polymyxin-hydrocortisone (CORTISPORIN) 3.5-10000-1 OTIC suspension Place 4 drops into the right ear daily. 01/27/18   [provider]  nitroGLYCERIN (NITROSTAT) 0.4 MG SL tablet Place 0.4 mg under the tongue every 5 (five) minutes as needed. For chest pain    [provider]  PACERONE 200 MG tablet TAKE 2 TABLETS BY MOUTH TWICE DAILY FOR 2 WEEKS AND 1 TWICE DAILY FOR 2 WEEKS AND 1 ONCE DAILY 04/09/19   Baldwin Jamaica, PA-C  PROAIR HFA 108 [phone] Base) MCG/ACT inhaler Inhale 2 puffs into the lungs See admin instructions. Inhale 2 puffs by mouth twice daily & every 4 hours as needed for wheezing or shortness of breath. 05/05/18   [provider]  Rivaroxaban (XARELTO) 15 MG TABS tablet Take 1 tablet (15 mg total) by mouth daily. 10/20/18   Lance Friar, PA-C  simvastatin (ZOCOR) 40 MG tablet Take 40 mg by mouth every morning.  07/15/10   [provider]  tamsulosin (FLOMAX) 0.4 MG CAPS capsule Take 0.4 mg by mouth daily. 02/27/18   [provider]  torsemide (DEMADEX) 20 MG tablet Take 2 tablets (40 mg total) by mouth every other day. Take 40mg  -  2  tablets daily by mouth x 3 days then 40mg  - 2 tablets by mouth every other day. 10/22/19   Lance Sprang, MD  TYLENOL SINUS SEVERE 5-325-200 MG TABS Take 1 tablet by mouth daily as needed (sinus headache).    [provider]    Allergies    Tetanus toxoids, Aspirin, and Penicillins  Review of Systems   Review of Systems  Constitutional: Negative for chills and fever.  HENT: Negative for ear pain and sore throat.   Eyes: Negative for pain and visual disturbance.  Respiratory: Negative for cough and shortness of breath.   Cardiovascular: Positive for chest pain. Negative for palpitations.  Gastrointestinal: Negative for abdominal pain and vomiting.  Genitourinary: Negative for dysuria and hematuria.  Musculoskeletal: Positive for back pain. Negative for arthralgias.  Skin: Negative for color change and rash.  Neurological: Negative for seizures and syncope.  All other systems reviewed and are  negative.   Physical Exam Updated Vital Signs BP 121/71   Pulse (!) 59   Temp 98 F (36.7 C)   Resp 20   SpO2 96%   Physical Exam Vitals and nursing note reviewed.  Constitutional:      Appearance: He is well-developed.  HENT:     Head: Normocephalic and atraumatic.  Eyes:     Conjunctiva/sclera: Conjunctivae normal.  Cardiovascular:     Rate and Rhythm: Normal rate and regular rhythm.     Heart sounds: No murmur heard.   Pulmonary:     Effort: Pulmonary effort is normal. No respiratory distress.     Breath sounds: Normal breath sounds.  Chest:     Chest wall: No mass or deformity.  Abdominal:     Palpations: Abdomen is soft.     Tenderness: There is no abdominal tenderness.  Musculoskeletal:  Cervical back: Neck supple.  Skin:    General: Skin is warm and dry.     Capillary Refill: Capillary refill takes less than 2 seconds.  Neurological:     General: No focal deficit present.     Mental Status: He is alert.     ED Results / Procedures / Treatments   Labs (all labs ordered are listed, but only abnormal results are displayed) Labs Reviewed  CBC WITH DIFFERENTIAL/PLATELET - Abnormal; Notable for the following components:      Result Value   WBC 11.9 (*)    RBC 3.90 (*)    MCV 106.9 (*)    MCH 35.1 (*)    Neutro Abs 9.9 (*)    Abs Immature Granulocytes 0.09 (*)    All other components within normal limits  BASIC METABOLIC PANEL - Abnormal; Notable for the following components:   Glucose, Bld 179 (*)    BUN 32 (*)    Creatinine, Ser 1.34 (*)    Calcium 8.7 (*)    GFR, Estimated 51 (*)    All other components within normal limits  BRAIN NATRIURETIC PEPTIDE - Abnormal; Notable for the following components:   B Natriuretic Peptide 294.6 (*)    All other components within normal limits  TROPONIN I (HIGH SENSITIVITY) - Abnormal; Notable for the following components:   Troponin I (High Sensitivity) 37 (*)    All other components within normal limits   TROPONIN I (HIGH SENSITIVITY)    EKG EKG Interpretation  Date/Time:  Thursday June 19 2020 13:29:18 EDT Ventricular Rate:  74 PR Interval:  64 QRS Duration: 188 QT Interval:  491 QTC Calculation: 545 R Axis:   -87 Text Interpretation: Sinus rhythm Atrial premature complex Short PR interval Probable left atrial enlargement Nonspecific IVCD with LAD Left ventricular hypertrophy Confirmed by Madalyn Rob [phone]) on 06/19/2020 1:51:51 PM   Radiology DG Chest 2 View  Result Date: 06/19/2020 CLINICAL DATA:  Chest pain and hyperglycemia today. EXAM: CHEST - 2 VIEW COMPARISON:  PA and lateral chest 09/27/2018.  CT chest 06/04/2020. FINDINGS: The lungs are emphysematous but clear. Heart size is normal. Aortic atherosclerosis. Pacing device in place. No pneumothorax or pleural fluid. No acute or focal bony abnormality. IMPRESSION: No acute disease. Aortic Atherosclerosis (ICD10-I70.0) and Emphysema (ICD10-J43.9). Electronically Signed   By: Inge Rise M.D.   On: 06/19/2020 14:03    Procedures Procedures   Medications Ordered in ED Medications  fentaNYL (SUBLIMAZE) injection 50 mcg (50 mcg Intravenous Given 06/19/20 1433)    ED Course  I have reviewed the triage vital signs and the nursing notes.  Pertinent labs & imaging results that were available during my care of the patient were reviewed by me and considered in my medical decision making (see chart for details).    MDM Rules/Calculators/A&P                         87 year old male presenting to ER with concern for chest pain, back pain.  Ongoing for the last few weeks.  Outpatient work-up included slightly elevated troponin but no delta troponin, negative CT angiogram of his chest.  In ER, patient appears well in no distress.  While awaiting labs and reassessment, patient signed out to [place].  Disposition pending labs, reassessment.  Final Clinical Impression(s) / ED Diagnoses Final diagnoses:  Chest pain, unspecified  type    Rx / DC Orders ED Discharge Orders    None  Lucrezia Starch, MD 06/19/20 1534

## 2020-06-19 NOTE — ED Notes (Signed)
Patient transported to X-ray 

## 2020-06-19 NOTE — ED Notes (Signed)
EDP into update pt. Pt alert, NAD, calm, interactive.

## 2020-06-24 ENCOUNTER — Ambulatory Visit (INDEPENDENT_AMBULATORY_CARE_PROVIDER_SITE_OTHER): Payer: Medicare Other

## 2020-06-24 DIAGNOSIS — I442 Atrioventricular block, complete: Secondary | ICD-10-CM | POA: Diagnosis not present

## 2020-06-26 LAB — CUP PACEART REMOTE DEVICE CHECK
Battery Remaining Longevity: 9 mo
Battery Remaining Percentage: 8 %
Battery Voltage: 2.69 V
Brady Statistic AP VP Percent: 34 %
Brady Statistic AP VS Percent: 1 %
Brady Statistic AS VP Percent: 65 %
Brady Statistic AS VS Percent: 1 %
Brady Statistic RA Percent Paced: 33 %
Brady Statistic RV Percent Paced: 99 %
Date Time Interrogation Session: 20220609095308
Implantable Lead Implant Date: 20130502
Implantable Lead Implant Date: 20130502
Implantable Lead Location: 753859
Implantable Lead Location: 753860
Implantable Pulse Generator Implant Date: 20130502
Lead Channel Impedance Value: 400 Ohm
Lead Channel Impedance Value: 450 Ohm
Lead Channel Pacing Threshold Amplitude: 0.75 V
Lead Channel Pacing Threshold Amplitude: 0.75 V
Lead Channel Pacing Threshold Pulse Width: 0.5 ms
Lead Channel Pacing Threshold Pulse Width: 0.5 ms
Lead Channel Sensing Intrinsic Amplitude: 12 mV
Lead Channel Sensing Intrinsic Amplitude: 4.9 mV
Lead Channel Setting Pacing Amplitude: 2 V
Lead Channel Setting Pacing Amplitude: 2.5 V
Lead Channel Setting Pacing Pulse Width: 0.5 ms
Lead Channel Setting Sensing Sensitivity: 4 mV
Pulse Gen Model: 2110
Pulse Gen Serial Number: 7321242

## 2020-06-27 ENCOUNTER — Telehealth: Payer: Self-pay | Admitting: Internal Medicine

## 2020-06-27 NOTE — Telephone Encounter (Signed)
Pt c/o of Chest Pain: STAT if CP now or developed within 24 hours  1. Are you having CP right now? yes  2. Are you experiencing any other symptoms (ex. SOB, nausea, vomiting, sweating)? sweating  3. How long have you been experiencing CP? Since first week in may  4. Is your CP continuous or coming and going? yes  5. Have you taken Nitroglycerin? yes ?

## 2020-06-27 NOTE — Telephone Encounter (Signed)
Spoke with pt's wife  and could hear pt in back ground Pt is HOH Pt continues to c/o chest pain and this is constant and daily  Per pt has been in pain since mid May  Pt has been taking Tylenol with little relief  "pain eases" but never goes away Per wife pt fell a couple months ago and reported no injury Per wife when pt is changing positions in bed may yell out in pain on occasion Will forward to Dr Caryl Comes for review Multiple of tests have been done with no answers on what is causing the symptom.

## 2020-06-30 NOTE — Telephone Encounter (Signed)
Have sent message to dr Reynaldo Minium, PCP, to coordinate approach to what sounds like noncardiac pain-- now weeks in duration

## 2020-07-03 ENCOUNTER — Telehealth: Payer: Self-pay

## 2020-07-03 NOTE — Telephone Encounter (Signed)
-----   Message from Deboraha Sprang, MD sent at 07/02/2020 11:24 AM EDT -----  Please inform patient that drug surveillance labs are normal

## 2020-07-03 NOTE — Telephone Encounter (Signed)
Spoke with pt's wife, Deneise Lever, [place] and advised per Dr Caryl Comes pt's labs are normal.  Pt's wife verbalized understanding and thanked RN for the call.

## 2020-07-15 NOTE — Progress Notes (Signed)
Will schedule monthly battery checks at 6 months per protocol.

## 2020-07-16 NOTE — Progress Notes (Signed)
Remote pacemaker transmission.   

## 2020-08-06 ENCOUNTER — Other Ambulatory Visit: Payer: Self-pay

## 2020-08-06 ENCOUNTER — Inpatient Hospital Stay (HOSPITAL_COMMUNITY)
Admission: EM | Admit: 2020-08-06 | Discharge: 2020-08-16 | DRG: 522 | Disposition: A | Discharge status: Home Health | Payer: Medicare Other | Attending: Internal Medicine | Admitting: Internal Medicine

## 2020-08-06 ENCOUNTER — Emergency Department (HOSPITAL_COMMUNITY): Payer: Medicare Other

## 2020-08-06 DIAGNOSIS — I4819 Other persistent atrial fibrillation: Secondary | ICD-10-CM | POA: Diagnosis present

## 2020-08-06 DIAGNOSIS — I4891 Unspecified atrial fibrillation: Secondary | ICD-10-CM | POA: Diagnosis not present

## 2020-08-06 DIAGNOSIS — N183 Chronic kidney disease, stage 3 unspecified: Secondary | ICD-10-CM | POA: Diagnosis present

## 2020-08-06 DIAGNOSIS — W010XXA Fall on same level from slipping, tripping and stumbling without subsequent striking against object, initial encounter: Secondary | ICD-10-CM | POA: Diagnosis present

## 2020-08-06 DIAGNOSIS — D72828 Other elevated white blood cell count: Secondary | ICD-10-CM | POA: Diagnosis present

## 2020-08-06 DIAGNOSIS — I700 Atherosclerosis of aorta: Secondary | ICD-10-CM

## 2020-08-06 DIAGNOSIS — I2510 Atherosclerotic heart disease of native coronary artery without angina pectoris: Secondary | ICD-10-CM | POA: Diagnosis present

## 2020-08-06 DIAGNOSIS — Z950 Presence of cardiac pacemaker: Secondary | ICD-10-CM

## 2020-08-06 DIAGNOSIS — Y92008 Other place in unspecified non-institutional (private) residence as the place of occurrence of the external cause: Secondary | ICD-10-CM | POA: Diagnosis not present

## 2020-08-06 DIAGNOSIS — I5032 Chronic diastolic (congestive) heart failure: Secondary | ICD-10-CM | POA: Diagnosis present

## 2020-08-06 DIAGNOSIS — Z86718 Personal history of other venous thrombosis and embolism: Secondary | ICD-10-CM

## 2020-08-06 DIAGNOSIS — Z7984 Long term (current) use of oral hypoglycemic drugs: Secondary | ICD-10-CM

## 2020-08-06 DIAGNOSIS — W19XXXA Unspecified fall, initial encounter: Secondary | ICD-10-CM

## 2020-08-06 DIAGNOSIS — D72829 Elevated white blood cell count, unspecified: Secondary | ICD-10-CM | POA: Diagnosis present

## 2020-08-06 DIAGNOSIS — I442 Atrioventricular block, complete: Secondary | ICD-10-CM | POA: Diagnosis present

## 2020-08-06 DIAGNOSIS — R0602 Shortness of breath: Secondary | ICD-10-CM

## 2020-08-06 DIAGNOSIS — E119 Type 2 diabetes mellitus without complications: Secondary | ICD-10-CM

## 2020-08-06 DIAGNOSIS — Z20822 Contact with and (suspected) exposure to covid-19: Secondary | ICD-10-CM | POA: Diagnosis present

## 2020-08-06 DIAGNOSIS — I252 Old myocardial infarction: Secondary | ICD-10-CM

## 2020-08-06 DIAGNOSIS — E1122 Type 2 diabetes mellitus with diabetic chronic kidney disease: Secondary | ICD-10-CM | POA: Diagnosis present

## 2020-08-06 DIAGNOSIS — Z79899 Other long term (current) drug therapy: Secondary | ICD-10-CM

## 2020-08-06 DIAGNOSIS — R21 Rash and other nonspecific skin eruption: Secondary | ICD-10-CM | POA: Diagnosis present

## 2020-08-06 DIAGNOSIS — J439 Emphysema, unspecified: Secondary | ICD-10-CM | POA: Diagnosis present

## 2020-08-06 DIAGNOSIS — Z720 Tobacco use: Secondary | ICD-10-CM | POA: Diagnosis present

## 2020-08-06 DIAGNOSIS — I130 Hypertensive heart and chronic kidney disease with heart failure and stage 1 through stage 4 chronic kidney disease, or unspecified chronic kidney disease: Secondary | ICD-10-CM | POA: Diagnosis present

## 2020-08-06 DIAGNOSIS — S72001A Fracture of unspecified part of neck of right femur, initial encounter for closed fracture: Secondary | ICD-10-CM | POA: Diagnosis not present

## 2020-08-06 DIAGNOSIS — M25579 Pain in unspecified ankle and joints of unspecified foot: Secondary | ICD-10-CM

## 2020-08-06 DIAGNOSIS — Z7952 Long term (current) use of systemic steroids: Secondary | ICD-10-CM

## 2020-08-06 DIAGNOSIS — I82409 Acute embolism and thrombosis of unspecified deep veins of unspecified lower extremity: Secondary | ICD-10-CM | POA: Diagnosis present

## 2020-08-06 DIAGNOSIS — F17210 Nicotine dependence, cigarettes, uncomplicated: Secondary | ICD-10-CM | POA: Diagnosis present

## 2020-08-06 DIAGNOSIS — E785 Hyperlipidemia, unspecified: Secondary | ICD-10-CM | POA: Diagnosis present

## 2020-08-06 DIAGNOSIS — Z806 Family history of leukemia: Secondary | ICD-10-CM | POA: Diagnosis not present

## 2020-08-06 DIAGNOSIS — Z7901 Long term (current) use of anticoagulants: Secondary | ICD-10-CM

## 2020-08-06 DIAGNOSIS — Z7902 Long term (current) use of antithrombotics/antiplatelets: Secondary | ICD-10-CM

## 2020-08-06 DIAGNOSIS — S72011A Unspecified intracapsular fracture of right femur, initial encounter for closed fracture: Secondary | ICD-10-CM | POA: Diagnosis present

## 2020-08-06 DIAGNOSIS — Z85820 Personal history of malignant melanoma of skin: Secondary | ICD-10-CM

## 2020-08-06 DIAGNOSIS — S72001D Fracture of unspecified part of neck of right femur, subsequent encounter for closed fracture with routine healing: Secondary | ICD-10-CM | POA: Diagnosis not present

## 2020-08-06 DIAGNOSIS — I5030 Unspecified diastolic (congestive) heart failure: Secondary | ICD-10-CM | POA: Diagnosis present

## 2020-08-06 DIAGNOSIS — Z01810 Encounter for preprocedural cardiovascular examination: Secondary | ICD-10-CM | POA: Diagnosis not present

## 2020-08-06 LAB — CBC WITH DIFFERENTIAL/PLATELET
Abs Immature Granulocytes: 0.18 10*3/uL — ABNORMAL HIGH (ref 0.00–0.07)
Basophils Absolute: 0.1 10*3/uL (ref 0.0–0.1)
Basophils Relative: 1 %
Eosinophils Absolute: 0.1 10*3/uL (ref 0.0–0.5)
Eosinophils Relative: 1 %
HCT: 40.3 % (ref 39.0–52.0)
Hemoglobin: 13.3 g/dL (ref 13.0–17.0)
Immature Granulocytes: 1 %
Lymphocytes Relative: 6 %
Lymphs Abs: 0.7 10*3/uL (ref 0.7–4.0)
MCH: 35.8 pg — ABNORMAL HIGH (ref 26.0–34.0)
MCHC: 33 g/dL (ref 30.0–36.0)
MCV: 108.3 fL — ABNORMAL HIGH (ref 80.0–100.0)
Monocytes Absolute: 0.8 10*3/uL (ref 0.1–1.0)
Monocytes Relative: 6 %
Neutro Abs: 11.2 10*3/uL — ABNORMAL HIGH (ref 1.7–7.7)
Neutrophils Relative %: 85 %
Platelets: 248 10*3/uL (ref 150–400)
RBC: 3.72 MIL/uL — ABNORMAL LOW (ref 4.22–5.81)
RDW: 12.9 % (ref 11.5–15.5)
WBC: 13.1 10*3/uL — ABNORMAL HIGH (ref 4.0–10.5)
nRBC: 0 % (ref 0.0–0.2)

## 2020-08-06 LAB — HEMOGLOBIN A1C
Hgb A1c MFr Bld: 7 % — ABNORMAL HIGH (ref 4.8–5.6)
Mean Plasma Glucose: 154.2 mg/dL

## 2020-08-06 LAB — TYPE AND SCREEN
ABO/RH(D): B POS
Antibody Screen: NEGATIVE

## 2020-08-06 LAB — ABO/RH: ABO/RH(D): B POS

## 2020-08-06 LAB — BASIC METABOLIC PANEL
Anion gap: 11 (ref 5–15)
BUN: 25 mg/dL — ABNORMAL HIGH (ref 8–23)
CO2: 21 mmol/L — ABNORMAL LOW (ref 22–32)
Calcium: 8.9 mg/dL (ref 8.9–10.3)
Chloride: 104 mmol/L (ref 98–111)
Creatinine, Ser: 1.03 mg/dL (ref 0.61–1.24)
GFR, Estimated: >60 mL/min (ref >60)
Glucose, Bld: 140 mg/dL — ABNORMAL HIGH (ref 70–99)
Potassium: 5 mmol/L (ref 3.5–5.1)
Sodium: 136 mmol/L (ref 135–145)

## 2020-08-06 LAB — RESP PANEL BY RT-PCR (FLU A&B, COVID) ARPGX2
Influenza A by PCR: NEGATIVE
Influenza B by PCR: NEGATIVE
SARS Coronavirus 2 by RT PCR: NEGATIVE

## 2020-08-06 MED ORDER — HYDROMORPHONE HCL 1 MG/ML IJ SOLN
0.5000 mg | INTRAMUSCULAR | Status: DC | PRN
Start: 2020-08-06 — End: 2020-08-06
  Administered 2020-08-06: 0.5 mg via INTRAVENOUS
  Filled 2020-08-06: qty 1

## 2020-08-06 MED ORDER — TAMSULOSIN HCL 0.4 MG PO CAPS: 0.4000 mg | ORAL_CAPSULE | Freq: Every day | ORAL | Status: DC

## 2020-08-06 MED ORDER — ALBUTEROL SULFATE HFA 108 (90 BASE) MCG/ACT IN AERS: 2.0000 | INHALATION_SPRAY | RESPIRATORY_TRACT | Status: DC

## 2020-08-06 MED ORDER — NITROGLYCERIN 0.4 MG SL SUBL: 0.4000 mg | SUBLINGUAL_TABLET | SUBLINGUAL | Status: DC | PRN

## 2020-08-06 MED ORDER — NICOTINE 21 MG/24HR TD PT24
21.0000 mg | MEDICATED_PATCH | Freq: Every day | TRANSDERMAL | Status: DC
  Administered 2020-08-07 – 2020-08-16 (×6): 21 mg via TRANSDERMAL
  Filled 2020-08-06 (×11): qty 1

## 2020-08-06 MED ORDER — ACETAMINOPHEN 325 MG PO TABS: 650.0000 mg | ORAL_TABLET | Freq: Four times a day (QID) | ORAL | Status: DC | PRN

## 2020-08-06 MED ORDER — HEPARIN (PORCINE) 25000 UT/250ML-% IV SOLN
1250.0000 [IU]/h | INTRAVENOUS | Status: DC
  Administered 2020-08-07: 1100 [IU]/h via INTRAVENOUS
  Administered 2020-08-08: 1300 [IU]/h via INTRAVENOUS
  Administered 2020-08-09 – 2020-08-10 (×2): 1250 [IU]/h via INTRAVENOUS
  Filled 2020-08-06 (×4): qty 250

## 2020-08-06 MED ORDER — ADULT MULTIVITAMIN W/MINERALS CH
1.0000 | ORAL_TABLET | Freq: Every day | ORAL | Status: DC
  Administered 2020-08-07 – 2020-08-09 (×3): 1 via ORAL
  Filled 2020-08-06 (×3): qty 1

## 2020-08-06 MED ORDER — TAMSULOSIN HCL 0.4 MG PO CAPS
0.4000 mg | ORAL_CAPSULE | Freq: Every day | ORAL | Status: DC
  Administered 2020-08-07 – 2020-08-16 (×10): 0.4 mg via ORAL
  Filled 2020-08-06 (×10): qty 1

## 2020-08-06 MED ORDER — SIMVASTATIN 20 MG PO TABS
40.0000 mg | ORAL_TABLET | Freq: Every morning | ORAL | Status: DC
  Administered 2020-08-07 – 2020-08-08 (×2): 40 mg via ORAL
  Filled 2020-08-06: qty 2

## 2020-08-06 MED ORDER — TORSEMIDE 20 MG PO TABS
40.0000 mg | ORAL_TABLET | ORAL | Status: DC
  Administered 2020-08-08 – 2020-08-16 (×5): 40 mg via ORAL
  Filled 2020-08-06 (×5): qty 2

## 2020-08-06 MED ORDER — HYDROCODONE-ACETAMINOPHEN 5-325 MG PO TABS
1.0000 | ORAL_TABLET | Freq: Four times a day (QID) | ORAL | Status: DC | PRN
  Administered 2020-08-06 – 2020-08-12 (×18): 2 via ORAL
  Filled 2020-08-06 (×18): qty 2

## 2020-08-06 MED ORDER — TORSEMIDE 20 MG PO TABS: 40.0000 mg | ORAL_TABLET | ORAL | Status: DC

## 2020-08-06 MED ORDER — INSULIN ASPART 100 UNIT/ML IJ SOLN
0.0000 [IU] | Freq: Three times a day (TID) | INTRAMUSCULAR | Status: DC
  Administered 2020-08-07: 1 [IU] via SUBCUTANEOUS
  Administered 2020-08-07 (×2): 2 [IU] via SUBCUTANEOUS
  Administered 2020-08-08: 1 [IU] via SUBCUTANEOUS
  Administered 2020-08-08: 3 [IU] via SUBCUTANEOUS
  Administered 2020-08-09: 2 [IU] via SUBCUTANEOUS
  Administered 2020-08-09: 1 [IU] via SUBCUTANEOUS
  Administered 2020-08-10: 2 [IU] via SUBCUTANEOUS
  Administered 2020-08-11: 3 [IU] via SUBCUTANEOUS
  Administered 2020-08-11: 2 [IU] via SUBCUTANEOUS
  Administered 2020-08-11 – 2020-08-12 (×2): 1 [IU] via SUBCUTANEOUS
  Administered 2020-08-12: 5 [IU] via SUBCUTANEOUS
  Administered 2020-08-13: 2 [IU] via SUBCUTANEOUS
  Administered 2020-08-13 (×2): 1 [IU] via SUBCUTANEOUS
  Administered 2020-08-14 (×2): 2 [IU] via SUBCUTANEOUS
  Administered 2020-08-14: 1 [IU] via SUBCUTANEOUS
  Administered 2020-08-15 (×3): 2 [IU] via SUBCUTANEOUS
  Administered 2020-08-16: 1 [IU] via SUBCUTANEOUS
  Administered 2020-08-16: 2 [IU] via SUBCUTANEOUS

## 2020-08-06 MED ORDER — PREDNISONE 10 MG PO TABS
10.0000 mg | ORAL_TABLET | Freq: Every day | ORAL | Status: DC
  Administered 2020-08-07 – 2020-08-09 (×3): 10 mg via ORAL
  Filled 2020-08-06 (×3): qty 1

## 2020-08-06 MED ORDER — ALBUTEROL SULFATE (2.5 MG/3ML) 0.083% IN NEBU
2.5000 mg | INHALATION_SOLUTION | Freq: Four times a day (QID) | RESPIRATORY_TRACT | Status: DC | PRN
  Filled 2020-08-06: qty 3

## 2020-08-06 MED ORDER — TRIAMCINOLONE ACETONIDE 0.1 % EX CREA
1.0000 "application " | TOPICAL_CREAM | Freq: Two times a day (BID) | CUTANEOUS | Status: DC
  Administered 2020-08-07 – 2020-08-16 (×16): 1 via TOPICAL
  Filled 2020-08-06: qty 15

## 2020-08-06 MED ORDER — FENTANYL CITRATE (PF) 100 MCG/2ML IJ SOLN
50.0000 ug | Freq: Once | INTRAMUSCULAR | Status: AC
  Administered 2020-08-06: 50 ug via INTRAVENOUS
  Filled 2020-08-06: qty 2

## 2020-08-06 MED ORDER — AMIODARONE HCL 200 MG PO TABS
200.0000 mg | ORAL_TABLET | Freq: Every day | ORAL | Status: DC
  Administered 2020-08-07 – 2020-08-16 (×10): 200 mg via ORAL
  Filled 2020-08-06 (×10): qty 1

## 2020-08-06 MED ORDER — MORPHINE SULFATE (PF) 2 MG/ML IV SOLN
0.5000 mg | INTRAVENOUS | Status: DC | PRN
  Administered 2020-08-07 – 2020-08-10 (×3): 0.5 mg via INTRAVENOUS
  Filled 2020-08-06 (×3): qty 1

## 2020-08-06 NOTE — ED Triage Notes (Signed)
BIB EMS for fall. Pt was walking at home and fell. No LOC but pt unable to bear weight on right leg, C/o right hip and right knee pain. Pt on xalelto. A&O x4.

## 2020-08-06 NOTE — H&P (Signed)
History and Physical    Lance Smith:[id number] DOB: 1931/04/22 DOA: 08/06/2020  PCP: Burnard Bunting, MD Consultants:   Patient coming from:  Home - lives with wife and some grandkids.   Chief Complaint: fall with right hip pain   HPI: Lance Smith is a 87 y.o. male with medical history significant of atrial fibrillation, complete heart block s/p pacemaker implant in 2013, atrial fibrillation , CAD with prior MI, diastotlic heart failure and recurrent DVT on eliquis and DM2 who presented for hip pain after a fall.  He was bending forward to pick something up and kept going. He put out his arm and did not hit his head. He had immediate pain in his right hip. Rated pain as 10/10 and "real bad." No radiation of pain. He was able to get up and walk to a few steps to a chair and then unable to bear weight on his right leg. Denies any syncope, chest pain, palpitations, fever/chills, shortness of breath, cough or leg swelling. Does complain of some ankle pain on the right.    ED Course: vitals: afebrile, bp: 119/62, HR: 96, RR: 21 and oxygen 97% on room air. Pertinent labs: Wbc: 13.1 (around baseline)  Mcv: 108.3. xray: right hip fracture. Ortho consulted we were asked to admit.   Review of Systems: As per HPI; otherwise review of systems reviewed and negative.   Ambulatory Status:  Ambulates with cane at times if walking long distances.    Past Medical History:  Diagnosis Date   Adenomatous colon polyp 01/2002   Arthritis    CKD (chronic kidney disease) stage 3, GFR 30-59 ml/min (HCC)    Complete heart block ([place])    a. 05/2011 s/p SJM Model [id number] Dual Chamber PPM   COPD (chronic obstructive pulmonary disease) (HCC)    Diabetes mellitus (HCC)    Diastolic CHF ([place])    a. 08/2503 Echo: EF 55-60%   DVT (deep venous thrombosis), unspecified laterality    a. previously on coumadin - d/c'd spring of 2013   Hyperglycemia    Hyperlipidemia    Hypertension    Inferior myocardial infarction  ([place]) 08/22/2013   Steroid dependence ([place])    Tobacco abuse     Past Surgical History:  Procedure Laterality Date   bilateral inguinal hernia     CARDIOVERSION N/A 06/16/2018   Procedure: CARDIOVERSION;  Surgeon: Fay Records, MD;  Location: [facility] ENDOSCOPY;  Service: Cardiovascular;  Laterality: N/A;   MELANOMA EXCISION     PACEMAKER INSERTION  May 2013   PERMANENT PACEMAKER INSERTION Left 05/20/2011   Procedure: PERMANENT PACEMAKER INSERTION;  Surgeon: Deboraha Sprang, MD;  Location: [facility] CATH LAB;  Service: Cardiovascular;  Laterality: Left;    Social History   Socioeconomic History   Marital status: Married    Spouse name: Not on file   Number of children: Not on file   Years of education: Not on file   Highest education level: Not on file  Occupational History   Occupation: retired  Tobacco Use   Smoking status: Every Day    Packs/day: 2.00    Types: Cigarettes   Smokeless tobacco: Never  Vaping Use   Vaping Use: Never used  Substance and Sexual Activity   Alcohol use: No    Alcohol/week: 0.0 standard drinks   Drug use: No   Sexual activity: Not Currently  Other Topics Concern   Not on file  Social History Narrative   Not on file  Social Determinants of Health   Financial Resource Strain: Not on file  Food Insecurity: Not on file  Transportation Needs: Not on file  Physical Activity: Not on file  Stress: Not on file  Social Connections: Not on file  Intimate Partner Violence: Not on file    Allergies  Allergen Reactions   Tetanus Toxoids Shortness Of Breath and Swelling   Aspirin Nausea Only and Other (See Comments)    headache   Penicillins Swelling    Did it involve swelling of the face/tongue/throat, SOB, or low BP? Yes Did it involve sudden or severe rash/hives, skin peeling, or any reaction on the inside of your mouth or nose? Unknown Did you need to seek medical attention at a hospital or doctor's office? Yes When did it last happen? 30 years ago If  all above answers are "NO", may proceed with cephalosporin use.     Family History  Problem Relation Age of Onset   Leukemia Mother    Colon cancer Neg Hx     Prior to Admission medications   Medication Sig Start Date End Date Taking? Authorizing Provider  clopidogrel (PLAVIX) 75 MG tablet Take 1 tablet (75 mg total) by mouth daily. Patient not taking: No sig reported 06/04/20   Deboraha Sprang, MD  metFORMIN (GLUCOPHAGE) 500 MG tablet Take 500 mg by mouth 2 (two) times daily with a meal. 07/13/15   [provider]  Multiple Vitamin (MULTIVITAMIN WITH MINERALS) TABS tablet Take 1 tablet by mouth daily. Centrum Silver    [provider]  nitroGLYCERIN (NITROSTAT) 0.4 MG SL tablet Place 0.4 mg under the tongue every 5 (five) minutes as needed. For chest pain    [provider]  PACERONE 200 MG tablet TAKE 2 TABLETS BY MOUTH TWICE DAILY FOR 2 WEEKS AND 1 TWICE DAILY FOR 2 WEEKS AND 1 ONCE DAILY Patient not taking: No sig reported 04/09/19   Baldwin Jamaica, PA-C  predniSONE (DELTASONE) 10 MG tablet Take 10 mg by mouth daily with breakfast.    [provider]  PROAIR HFA 108 (90 Base) MCG/ACT inhaler Inhale 2 puffs into the lungs See admin instructions. Inhale 2 puffs by mouth twice daily & every 4 hours as needed for wheezing or shortness of breath. 05/05/18   [provider]  Rivaroxaban (XARELTO) 15 MG TABS tablet Take 1 tablet (15 mg total) by mouth daily. 10/20/18   Shirley Friar, PA-C  simvastatin (ZOCOR) 40 MG tablet Take 40 mg by mouth every morning.  07/15/10   [provider]  tamsulosin (FLOMAX) 0.4 MG CAPS capsule Take 0.4 mg by mouth daily. 02/27/18   [provider]  torsemide (DEMADEX) 20 MG tablet Take 2 tablets (40 mg total) by mouth every other day. Take 40mg  -  2  tablets daily by mouth x 3 days then 40mg  - 2 tablets by mouth every other day. Patient taking differently: Take 40 mg by mouth every other day.  10/22/19   Deboraha Sprang, MD    Physical Exam: Vitals:   08/06/20 1420 08/06/20 1430 08/06/20 1530 08/06/20 1700  BP: 124/66 111/62 120/60 (!) 113/59  Pulse: 61 63  64  Resp: (!) 22 16 16 20   Temp:      TempSrc:      SpO2: 95% 93%  94%  Weight:      Height:         General:  Appears calm and comfortable and is in NAD Eyes:  PERRL,  EOMI, normal lids, iris ENT:  very hard of hearing, lips & tongue, mmm; no teeth  Neck:  no LAD, masses or thyromegaly; no carotid bruits Cardiovascular:  RRR, no m/r/g. Pitting Edema above ankles bilaterally  Respiratory:   CTA bilaterally with no wheezes/rales/rhonchi.  Normal respiratory effort. Abdomen:  soft, NT, ND, NABS Back:   normal alignment, no CVAT Skin:  erythematous plaque like rash on anterior chest.  Musculoskeletal:  right leg shortened and turned outward. Can move toes and sensation intact.   Limited foot exam with no ulcerations.  2+ distal pulses. Psychiatric:  grossly normal mood and affect, speech fluent and appropriate, AOx3 Neurologic:  CN 2-12 grossly intact, moves all extremities in coordinated fashion, sensation intact    Radiological Exams on Admission: Independently reviewed - see discussion in A/P where applicable  DG Ankle Complete Right  Result Date: 08/06/2020 CLINICAL DATA:  Fall with right ankle pain EXAM: RIGHT ANKLE - COMPLETE 3+ VIEW COMPARISON:  None. FINDINGS: Lateral soft tissue swelling. No definitive fracture or malalignment. Ankle mortise is symmetric. IMPRESSION: Soft tissue swelling.  No definite acute osseous abnormality Electronically Signed   By: Donavan Foil M.D.   On: 08/06/2020 15:31   CT HIP RIGHT WO CONTRAST  Result Date: 08/06/2020 CLINICAL DATA:  Right hip pain after a fall while walking today. Initial encounter. EXAM: CT OF THE RIGHT HIP WITHOUT CONTRAST TECHNIQUE: Multidetector CT imaging of the right hip was performed according to the standard protocol. Multiplanar CT image reconstructions  were also generated. COMPARISON:  Plain films right hip today. FINDINGS: Bones/Joint/Cartilage As seen on the comparison examination, the patient has a mildly impacted subcapital fracture of the right hip. There is also mild anterior displacement of the femoral neck. No other fracture is identified. The hip is located. No lytic or sclerotic lesion is identified. Mild appearing right hip osteoarthritis noted. Ligaments Suboptimally assessed by CT. Muscles and Tendons Appear intact. Soft tissues Prostatomegaly.  Sigmoid diverticulosis. IMPRESSION: Mildly impacted and anteriorly displaced subcapital fracture right hip. No other acute finding. Mild right hip osteoarthritis. Prostatomegaly. Sigmoid diverticulosis. Electronically Signed   By: Inge Rise M.D.   On: 08/06/2020 14:58   DG Hip Unilat W or Wo Pelvis 2-3 Views Right  Result Date: 08/06/2020 CLINICAL DATA:  [place] while walking. Unable to bear weight on the right leg. EXAM: DG HIP (WITH OR WITHOUT PELVIS) 2-3V RIGHT COMPARISON:  None. FINDINGS: There is a mildly impacted but otherwise nondisplaced subcapital fracture of the right hip. The left hip is intact. The pubic symphysis and SI joints are intact. No pelvic fractures or bone lesions. Advanced vascular calcifications. IMPRESSION: Mildly impacted but otherwise nondisplaced subcapital fracture of the right hip. Electronically Signed   By: Marijo Sanes M.D.   On: 08/06/2020 14:01    EKG: Independently reviewed.  NSR with rate 66 with PAC; nonspecific ST changes with no evidence of acute ischemia.    Labs on Admission: I have personally reviewed the available labs and imaging studies at the time of the admission.  Pertinent labs:  Wbc: 13.1 (around baseline)  Mcv: 108.3  Right hip fx-mildly impacted and anteriorly displaced subcapital fracture.   Assessment/Plan Principal Problem:   Closed right hip fracture (HCC) -ortho consulted and planning on surgery after holding eliquis -follow  for when he plans for surgery -diet ordered -heparin drip to bridge since holding eliquis with history of recurrent DVTs -pain control with tylenol, norco and morphine prn -discussed risks with patient of surgery with  age and risk factors  Active Problems:   Complete heart block [facility]) S/p pacemaker implantation 05/2011 Continue tele     Atrial fibrillation (HCC) -NSR, continue pacerone daily -tele -heparin drip     CAD (coronary artery disease),Prior IMI  -holding plavix -continue statin     Type 2 diabetes mellitus without complication (HCC) -holding metformin -a1c pending -SSI and accuchecks    Diastolic heart failure ([place]) -continue lasix  -ins/outs and daily weights -no signs of exacerbation/fluid overload  -last echo 08/2019: EF of 60-65%, normal LVF, grade 1 diastolic dysfunction.     DVT, lower extremity, recurrent ([place]) -holding eliquis -heparin drip for bridge, follow surgery for when he plans for surgery to stop this    Leukocytosis -at his baseline  -continue to monitor -outpatient work up if needed     Tobacco abuse Nicotine patch, no interest in quitting    Rash  -continue triamcinolone BID     Body mass index is 24.65 kg/m.   Level of care: Telemetry Medical DVT prophylaxis: heparin drip  Code Status:  Full - confirmed with patient, wants to think about this and talk to his wife first.  Family Communication: called his wife, Ellison Rieth: [phone] Disposition Plan:  The patient is from: home  Anticipated d/c is to: rehab vs. Home depending on recommendations post surgery.     Consults called: ortho by EDP Admission status:  inpatient    Orma Flaming MD [facility]   How to contact the [facility] Attending or Consulting provider [place] or covering provider during after hours [place], for this patient?  Check the care team in [facility] and look for a) attending/consulting [facility] provider listed and b) the [facility] team listed Log into  [email or web address] and use [place]'s universal password to access. If you do not have the password, please contact the hospital operator. Locate the [facility] provider you are looking for under [facility] and page to a number that you can be directly reached. If you still have difficulty reaching the provider, please page the [facility] (Director on Call) for the Hospitalists listed on amion for assistance.   08/06/2020, 6:00 PM

## 2020-08-06 NOTE — ED Provider Notes (Signed)
[place] EMERGENCY DEPARTMENT Provider Note   CSN: [id number] Arrival date & time: 08/06/20  1212     History Chief Complaint  Patient presents with  . Fall    Lance Smith is a 87 y.o. male.  HPI 87 year old male presents after a fall and right hip injury.  The patient states that he was bending to pick something up and just kept going.  He protected his head with his forearm which has a small bruise but he states is otherwise not hurting.  However he was not able to get up and walk besides get himself to a chair because of his right hip.  Any movement causes severe pain.  He had initially declined pain medicine by EMS.  Past Medical History:  Diagnosis Date  . Adenomatous colon polyp 01/2002  . Arthritis   . CKD (chronic kidney disease) stage 3, GFR 30-59 ml/min (HCC)   . Complete heart block ([place])    a. 05/2011 s/p SJM Model [id number] Dual Chamber PPM  . COPD (chronic obstructive pulmonary disease) ([place])   . Diabetes mellitus ([place])   . Diastolic CHF ([place])    a. 02/9796 Echo: EF 55-60%  . DVT (deep venous thrombosis), unspecified laterality    a. previously on coumadin - d/c'd spring of 2013  . Hyperglycemia   . Hyperlipidemia   . Hypertension   . Inferior myocardial infarction ([place]) 08/22/2013  . Steroid dependence ([place])   . Tobacco abuse     Patient Active Problem List   Diagnosis Date Noted  . Closed right hip fracture ([place]) 08/06/2020  . DVT, lower extremity, recurrent ([place]) 08/06/2020  . CAD (coronary artery disease),Prior IMI  08/22/2013  . Inferior myocardial infarction ([place]) 08/22/2013  . Hx of adenomatous colonic polyps 07/16/2013  . Atrial fibrillation ([place]) 06/02/2012  . Diastolic heart failure ([place]) 06/29/2011  . Hyperglycemia 05/24/2011  . Urinary retention 05/21/2011  . Steroid dependence ([place]) 05/21/2011  . Pacemaker-[place] 05/21/2011  . Complete heart block ([place]) 05/20/2011  . Tobacco abuse 05/20/2011    Past Surgical History:   Procedure Laterality Date  . bilateral inguinal hernia    . CARDIOVERSION N/A 06/16/2018   Procedure: CARDIOVERSION;  Surgeon: Fay Records, MD;  Location: [facility];  Service: Cardiovascular;  Laterality: N/A;  . MELANOMA EXCISION    . PACEMAKER INSERTION  May 2013  . PERMANENT PACEMAKER INSERTION Left 05/20/2011   Procedure: PERMANENT PACEMAKER INSERTION;  Surgeon: Deboraha Sprang, MD;  Location[facility] CATH LAB;  Service: Cardiovascular;  Laterality: Left;       Family History  Problem Relation Age of Onset  . Leukemia Mother   . Colon cancer Neg Hx     Social History   Tobacco Use  . Smoking status: Every Day    Packs/day: 2.00    Types: Cigarettes  . Smokeless tobacco: Never  Vaping Use  . Vaping Use: Never used  Substance Use Topics  . Alcohol use: No    Alcohol/week: 0.0 standard drinks  . Drug use: No    Home Medications Prior to Admission medications   Medication Sig Start Date End Date Taking? Authorizing Provider  clopidogrel (PLAVIX) 75 MG tablet Take 1 tablet (75 mg total) by mouth daily. Patient not taking: No sig reported 06/04/20   Deboraha Sprang, MD  fluocinonide ointment (LIDEX) 9.21 % Apply 1 application topically 2 (two) times daily. 07/25/20   [provider]  metFORMIN (GLUCOPHAGE) 500 MG tablet Take 500 mg  by mouth 2 (two) times daily with a meal. 07/13/15   [provider]  Multiple Vitamin (MULTIVITAMIN WITH MINERALS) TABS tablet Take 1 tablet by mouth daily. Centrum Silver    [provider]  nitroGLYCERIN (NITROSTAT) 0.4 MG SL tablet Place 0.4 mg under the tongue every 5 (five) minutes as needed. For chest pain    [provider]  [facility] VERIO test strip 1 each by Other route See admin instructions. Once or twice daily 08/05/20   [provider]  PACERONE 200 MG tablet TAKE 2 TABLETS BY MOUTH TWICE DAILY FOR 2 WEEKS AND 1 TWICE DAILY FOR 2 WEEKS AND 1 ONCE DAILY Patient not taking: No sig reported 04/09/19    Baldwin Jamaica, PA-C  predniSONE (DELTASONE) 10 MG tablet Take 10 mg by mouth daily with breakfast.    [provider]  PROAIR HFA 108 (90 Base) MCG/ACT inhaler Inhale 2 puffs into the lungs See admin instructions. Inhale 2 puffs by mouth twice daily & every 4 hours as needed for wheezing or shortness of breath. 05/05/18   [provider]  Rivaroxaban (XARELTO) 15 MG TABS tablet Take 1 tablet (15 mg total) by mouth daily. 10/20/18   Shirley Friar, PA-C  simvastatin (ZOCOR) 40 MG tablet Take 40 mg by mouth every morning.  07/15/10   [provider]  tamsulosin (FLOMAX) 0.4 MG CAPS capsule Take 0.4 mg by mouth daily. 02/27/18   [provider]  torsemide (DEMADEX) 20 MG tablet Take 2 tablets (40 mg total) by mouth every other day. Take 40mg  -  2  tablets daily by mouth x 3 days then 40mg  - 2 tablets by mouth every other day. Patient taking differently: Take 40 mg by mouth every other day. 10/22/19   Deboraha Sprang, MD  triamcinolone cream (KENALOG) 0.1 % Apply 1 application topically 2 (two) times daily. 07/09/20   [provider]    Allergies    Tetanus toxoids, Aspirin, and Penicillins  Review of Systems   Review of Systems  Respiratory:  Negative for shortness of breath.   Cardiovascular:  Negative for chest pain.  Musculoskeletal:  Positive for arthralgias.  Neurological:  Negative for syncope and light-headedness.  All other systems reviewed and are negative.  Physical Exam Updated Vital Signs BP 111/62   Pulse 63   Temp 97.7 F (36.5 C) (Oral)   Resp 16   Ht 5\' 9"  (1.753 m)   Wt 75.7 kg   SpO2 93%   BMI 24.65 kg/m   Physical Exam Vitals and nursing note reviewed.  Constitutional:      General: He is not in acute distress.    Appearance: He is well-developed. He is not ill-appearing or diaphoretic.  HENT:     Head: Normocephalic and atraumatic.     Right Ear: External ear normal.     Left Ear: External ear normal.      Nose: Nose normal.  Eyes:     General:        Right eye: No discharge.        Left eye: No discharge.  Cardiovascular:     Rate and Rhythm: Normal rate and regular rhythm.     Pulses:          Dorsalis pedis pulses are 2+ on the right side.     Heart sounds: Normal heart sounds.  Pulmonary:     Effort: Pulmonary effort is normal.     Breath sounds: Normal breath  sounds.  Abdominal:     General: There is no distension.  Musculoskeletal:     Cervical back: Neck supple.     Right hip: Tenderness present. No deformity. Decreased range of motion.     Right knee: Decreased range of motion. No tenderness.  Skin:    General: Skin is warm and dry.  Neurological:     Mental Status: He is alert.  Psychiatric:        Mood and Affect: Mood is not anxious.    ED Results / Procedures / Treatments   Labs (all labs ordered are listed, but only abnormal results are displayed) Labs Reviewed  BASIC METABOLIC PANEL - Abnormal; Notable for the following components:      Result Value   CO2 21 (*)    Glucose, Bld 140 (*)    BUN 25 (*)    All other components within normal limits  CBC WITH DIFFERENTIAL/PLATELET - Abnormal; Notable for the following components:   WBC 13.1 (*)    RBC 3.72 (*)    MCV 108.3 (*)    MCH 35.8 (*)    Neutro Abs 11.2 (*)    Abs Immature Granulocytes 0.18 (*)    All other components within normal limits  RESP PANEL BY RT-PCR (FLU A&B, COVID) ARPGX2  TYPE AND SCREEN  ABO/RH    EKG EKG Interpretation  Date/Time:  Wednesday August 06 2020 12:15:37 EDT Ventricular Rate:  66 PR Interval:  179 QRS Duration: 199 QT Interval:  523 QTC Calculation: 549 R Axis:   -87 Text Interpretation: Sinus rhythm Atrial premature complex Nonspecific IVCD with LAD Left ventricular hypertrophy similar to June 2022 Confirmed by Sherwood Gambler [id number]) on 08/06/2020 12:18:25 PM  Radiology CT HIP RIGHT WO CONTRAST  Result Date: 08/06/2020 CLINICAL DATA:  Right hip pain after a fall  while walking today. Initial encounter. EXAM: CT OF THE RIGHT HIP WITHOUT CONTRAST TECHNIQUE: Multidetector CT imaging of the right hip was performed according to the standard protocol. Multiplanar CT image reconstructions were also generated. COMPARISON:  Plain films right hip today. FINDINGS: Bones/Joint/Cartilage As seen on the comparison examination, the patient has a mildly impacted subcapital fracture of the right hip. There is also mild anterior displacement of the femoral neck. No other fracture is identified. The hip is located. No lytic or sclerotic lesion is identified. Mild appearing right hip osteoarthritis noted. Ligaments Suboptimally assessed by CT. Muscles and Tendons Appear intact. Soft tissues Prostatomegaly.  Sigmoid diverticulosis. IMPRESSION: Mildly impacted and anteriorly displaced subcapital fracture right hip. No other acute finding. Mild right hip osteoarthritis. Prostatomegaly. Sigmoid diverticulosis. Electronically Signed   By: Inge Rise M.D.   On: 08/06/2020 14:58   DG Hip Unilat W or Wo Pelvis 2-3 Views Right  Result Date: 08/06/2020 CLINICAL DATA:  [place] while walking. Unable to bear weight on the right leg. EXAM: DG HIP (WITH OR WITHOUT PELVIS) 2-3V RIGHT COMPARISON:  None. FINDINGS: There is a mildly impacted but otherwise nondisplaced subcapital fracture of the right hip. The left hip is intact. The pubic symphysis and SI joints are intact. No pelvic fractures or bone lesions. Advanced vascular calcifications. IMPRESSION: Mildly impacted but otherwise nondisplaced subcapital fracture of the right hip. Electronically Signed   By: Marijo Sanes M.D.   On: 08/06/2020 14:01    Procedures Procedures   Medications Ordered in ED Medications  HYDROmorphone (DILAUDID) injection 0.5 mg (0.5 mg Intravenous Given 08/06/20 1420)  fentaNYL (SUBLIMAZE) injection 50 mcg (50 mcg Intravenous Given 08/06/20  1240)    ED Course  I have reviewed the triage vital signs and the nursing  notes.  Pertinent labs & imaging results that were available during my care of the patient were reviewed by me and considered in my medical decision making (see chart for details).    MDM Rules/Calculators/A&P                           Patient's x-ray confirms a right hip fracture.  Otherwise he is neurovascular intact.  He was given IV pain medicine.  Orthopedics consulted, unclear timeframe at this point due to his anticoagulation.  Discussed with Dr. Rogers Blocker for admission.  He did not hit his head. Final Clinical Impression(s) / ED Diagnoses Final diagnoses:  Closed subcapital fracture of right femur, initial encounter [facility])    Rx / [place] Orders ED Discharge Orders     None        Sherwood Gambler, MD 08/06/20 [phone]

## 2020-08-06 NOTE — Progress Notes (Signed)
This case has been discussed extensively with Hilbert Odor, PA-C.  I have reviewed his note as well.  At this time after review of the CT scan it appears that Lance Smith will need an arthroplasty of his right hip.  He does have a mildly angulated intracapsular hip fracture.  Due to the recent use of anticoagulant he will need to wait until at least Friday before the surgery can be accomplished.  He is okay to have a diet tonight and tomorrow, but will need to be n.p.o. Thursday night at midnight.  We will follow along and update the team in terms of surgical treatment.

## 2020-08-06 NOTE — Progress Notes (Signed)
ANTICOAGULATION CONSULT NOTE - Initial Consult  Pharmacy Consult for Heparin Indication: atrial fibrillation and hx of DVT  Allergies  Allergen Reactions   Tetanus Toxoids Shortness Of Breath and Swelling   Aspirin Nausea Only and Other (See Comments)    headache   Penicillins Swelling    Did it involve swelling of the face/tongue/throat, SOB, or low BP? Yes Did it involve sudden or severe rash/hives, skin peeling, or any reaction on the inside of your mouth or nose? Unknown Did you need to seek medical attention at a hospital or doctor's office? Yes When did it last happen? 30 years ago If all above answers are "NO", may proceed with cephalosporin use.    Patient Measurements: Height: 5\' 9"  (175.3 cm) Weight: 75.7 kg (166 lb 14.2 oz) IBW/kg (Calculated) : 70.7 Heparin Dosing Weight: 75.7 kg  Vital Signs: Temp: 97.7 F (36.5 C) (07/20 1219) Temp Source: Oral (07/20 1219) BP: 113/59 (07/20 1700) Pulse Rate: 64 (07/20 1700)  Labs: Recent Labs    08/06/20 1403  HGB 13.3  HCT 40.3  PLT 248  CREATININE 1.03    Estimated Creatinine Clearance: 50.5 mL/min (by C-G formula based on SCr of 1.03 mg/dL).   Medical History: Past Medical History:  Diagnosis Date   Adenomatous colon polyp 01/2002   Arthritis    CKD (chronic kidney disease) stage 3, GFR 30-59 ml/min (HCC)    Complete heart block ([place])    a. 05/2011 s/p SJM Model [id number] Dual Chamber PPM   COPD (chronic obstructive pulmonary disease) (HCC)    Diabetes mellitus (HCC)    Diastolic CHF ([place])    a. 03/2120 Echo: EF 55-60%   DVT (deep venous thrombosis), unspecified laterality    a. previously on coumadin - d/c'd spring of 2013   Hyperglycemia    Hyperlipidemia    Hypertension    Inferior myocardial infarction ([place]) 08/22/2013   Steroid dependence ([place])    Tobacco abuse     Medications:  Xarelto 15 mg PO daily. Last dose taken this AM at 8am  Assessment: Pt presents with hip pain s/p fall and MD is planning  for hip surgery later this week. Pt is on chronic Xarelto for Afib and recurrent DVTs. Pt will be transitioning to heparin for anticipated surgery.   Goal of Therapy:  Heparin level 0.3-0.7 units/ml aPTT 66-102 seconds Monitor platelets by anticoagulation protocol: Yes   Plan:  Heparin 1100 IV units/hr starting tomorrow at 0800. HL/aPTT 8 hours. Monitor HL, aPTT, CBC, and s/sx of bleeding.  Thank you for allowing Pharmacy to take part of this patient's care.  Marlowe Alt, PharmD Candidate 08/06/2020 6:25 PM

## 2020-08-06 NOTE — H&P (View-Only) (Signed)
Reason for Consult:Right hip fx Referring Physician: Sherwood Gambler Time called: 1412 Time at bedside: Lance Smith is an 87 y.o. male.  HPI: Lance Smith bent over earlier today to pick up his napkin and lost his balance and fell forwards. He had immediate right hip pain. He was able to get up and into a chair but then couldn't get out of it again and called EMS. He was brought to the ED where x-rays showed a right hip fx and orthopedic surgery was consulted. He lives with his disabled wife at home and has been ambulating with a cane of late.  Past Medical History:  Diagnosis Date   Adenomatous colon polyp 01/2002   Arthritis    CKD (chronic kidney disease) stage 3, GFR 30-59 ml/min (HCC)    Complete heart block ([place])    a. 05/2011 s/p SJM Model [id number] Dual Chamber PPM   COPD (chronic obstructive pulmonary disease) (HCC)    Diabetes mellitus (HCC)    Diastolic CHF ([place])    a. 04/4816 Echo: EF 55-60%   DVT (deep venous thrombosis), unspecified laterality    a. previously on coumadin - d/c'd spring of 2013   Hyperglycemia    Hyperlipidemia    Hypertension    Inferior myocardial infarction ([place]) 08/22/2013   Steroid dependence ([place])    Tobacco abuse     Past Surgical History:  Procedure Laterality Date   bilateral inguinal hernia     CARDIOVERSION N/A 06/16/2018   Procedure: CARDIOVERSION;  Surgeon: Fay Records, MD;  Location: [facility] ENDOSCOPY;  Service: Cardiovascular;  Laterality: N/A;   MELANOMA EXCISION     PACEMAKER INSERTION  May 2013   PERMANENT PACEMAKER INSERTION Left 05/20/2011   Procedure: PERMANENT PACEMAKER INSERTION;  Surgeon: Deboraha Sprang, MD;  Location: [facility] CATH LAB;  Service: Cardiovascular;  Laterality: Left;    Family History  Problem Relation Age of Onset   Leukemia Mother    Colon cancer Neg Hx     Social History:  reports that he has been smoking cigarettes. He has been smoking an average of 2 packs per day. He has never used smokeless tobacco. He reports that  he does not drink alcohol and does not use drugs.  Allergies:  Allergies  Allergen Reactions   Tetanus Toxoids Shortness Of Breath and Swelling   Aspirin Nausea Only and Other (See Comments)    headache   Penicillins Swelling    Did it involve swelling of the face/tongue/throat, SOB, or low BP? Yes Did it involve sudden or severe rash/hives, skin peeling, or any reaction on the inside of your mouth or nose? Unknown Did you need to seek medical attention at a hospital or doctor's office? Yes When did it last happen? 30 years ago If all above answers are "NO", may proceed with cephalosporin use.     Medications: I have reviewed the patient's current medications.  Results for orders placed or performed during the hospital encounter of 08/06/20 (from the past 48 hour(s))  Basic metabolic panel     Status: Abnormal   Collection Time: 08/06/20  2:03 PM  Result Value Ref Range   Sodium 136 135 - 145 mmol/L   Potassium 5.0 3.5 - 5.1 mmol/L   Chloride 104 98 - 111 mmol/L   CO2 21 (L) 22 - 32 mmol/L   Glucose, Bld 140 (H) 70 - 99 mg/dL    Comment: Glucose reference range applies only to samples taken after fasting for at least  8 hours.   BUN 25 (H) 8 - 23 mg/dL   Creatinine, Ser 1.03 0.61 - 1.24 mg/dL   Calcium 8.9 8.9 - 10.3 mg/dL   GFR, Estimated >60 >60 mL/min    Comment: (NOTE) Calculated using the CKD-EPI Creatinine Equation (2021)    Anion gap 11 5 - 15    Comment: Performed at [facility] [address], [place], [place] [address]    DG Hip Malvin Johns or [place] Pelvis 2-3 Views Right  Result Date: 08/06/2020 CLINICAL DATA:  [place] while walking. Unable to bear weight on the right leg. EXAM: DG HIP (WITH OR WITHOUT PELVIS) 2-3V RIGHT COMPARISON:  None. FINDINGS: There is a mildly impacted but otherwise nondisplaced subcapital fracture of the right hip. The left hip is intact. The pubic symphysis and SI joints are intact. No pelvic fractures or bone lesions. Advanced vascular  calcifications. IMPRESSION: Mildly impacted but otherwise nondisplaced subcapital fracture of the right hip. Electronically Signed   By: Marijo Sanes M.D.   On: 08/06/2020 14:01    Review of Systems  HENT:  Negative for ear discharge, ear pain, hearing loss and tinnitus.   Eyes:  Negative for photophobia and pain.  Respiratory:  Negative for cough and shortness of breath.   Cardiovascular:  Negative for chest pain.  Gastrointestinal:  Negative for abdominal pain, nausea and vomiting.  Genitourinary:  Negative for dysuria, flank pain, frequency and urgency.  Musculoskeletal:  Positive for arthralgias (Right hip>right ankle). Negative for back pain, myalgias and neck pain.  Neurological:  Negative for dizziness and headaches.  Hematological:  Does not bruise/bleed easily.  Psychiatric/Behavioral:  The patient is not nervous/anxious.   Blood pressure 119/62, pulse 96, temperature 97.7 F (36.5 C), temperature source Oral, resp. rate (!) 21, height 5\' 9"  (1.753 m), weight 75.7 kg, SpO2 97 %. Physical Exam Constitutional:      General: He is not in acute distress.    Appearance: He is well-developed. He is not diaphoretic.  HENT:     Head: Normocephalic and atraumatic.  Eyes:     General: No scleral icterus.       Right eye: No discharge.        Left eye: No discharge.     Conjunctiva/sclera: Conjunctivae normal.  Cardiovascular:     Rate and Rhythm: Normal rate and regular rhythm.  Pulmonary:     Effort: Pulmonary effort is normal. No respiratory distress.  Musculoskeletal:     Cervical back: Normal range of motion.     Comments: RLE No traumatic wounds, ecchymosis, or rash  Mod TTP hip, mild TTP lat ankle  No knee or ankle effusion  Knee stable to varus/ valgus and anterior/posterior stress  Sens DPN, SPN, TN intact  Motor EHL, ext, flex, evers 5/5  DP 1+, PT 0, 2+ pitting edema  Skin:    General: Skin is warm and dry.  Neurological:     Mental Status: He is alert.   Psychiatric:        Mood and Affect: Mood normal.        Behavior: Behavior normal.    Assessment/Plan: Right hip fx -- Will probably need hip replacement once Eliquis washed out but will check CT to see if hip pinning an option. No surgery today, may have diet. Right ankle pain -- Will check x-rays Multiple medical problems including CKD, COPD, complete heart block status post pacemaker placement, and DVT on Eliquis -- Will need medical admission and clearance, appreciate  help. Please hold anticoagulants, bridge with heparin if needed.    Lisette Abu, PA-C Orthopedic Surgery [phone] 08/06/2020, 2:24 PM

## 2020-08-06 NOTE — Consult Note (Signed)
Reason for Consult:Right hip fx Referring Physician: Sherwood Gambler Time called: 1412 Time at bedside: Lance Smith is an 87 y.o. male.  HPI: Lance Smith bent over earlier today to pick up his napkin and lost his balance and fell forwards. He had immediate right hip pain. He was able to get up and into a chair but then couldn't get out of it again and called EMS. He was brought to the ED where x-rays showed a right hip fx and orthopedic surgery was consulted. He lives with his disabled wife at home and has been ambulating with a cane of late.  Past Medical History:  Diagnosis Date   Adenomatous colon polyp 01/2002   Arthritis    CKD (chronic kidney disease) stage 3, GFR 30-59 ml/min (HCC)    Complete heart block ([place])    a. 05/2011 s/p SJM Model [id number] Dual Chamber PPM   COPD (chronic obstructive pulmonary disease) (HCC)    Diabetes mellitus (HCC)    Diastolic CHF ([place])    a. 0/9470 Echo: EF 55-60%   DVT (deep venous thrombosis), unspecified laterality    a. previously on coumadin - d/c'd spring of 2013   Hyperglycemia    Hyperlipidemia    Hypertension    Inferior myocardial infarction ([place]) 08/22/2013   Steroid dependence ([place])    Tobacco abuse     Past Surgical History:  Procedure Laterality Date   bilateral inguinal hernia     CARDIOVERSION N/A 06/16/2018   Procedure: CARDIOVERSION;  Surgeon: Fay Records, MD;  Location[facility] ENDOSCOPY;  Service: Cardiovascular;  Laterality: N/A;   MELANOMA EXCISION     PACEMAKER INSERTION  May 2013   PERMANENT PACEMAKER INSERTION Left 05/20/2011   Procedure: PERMANENT PACEMAKER INSERTION;  Surgeon: Deboraha Sprang, MD;  Location: [facility] CATH LAB;  Service: Cardiovascular;  Laterality: Left;    Family History  Problem Relation Age of Onset   Leukemia Mother    Colon cancer Neg Hx     Social History:  reports that he has been smoking cigarettes. He has been smoking an average of 2 packs per day. He has never used smokeless tobacco. He reports that  he does not drink alcohol and does not use drugs.  Allergies:  Allergies  Allergen Reactions   Tetanus Toxoids Shortness Of Breath and Swelling   Aspirin Nausea Only and Other (See Comments)    headache   Penicillins Swelling    Did it involve swelling of the face/tongue/throat, SOB, or low BP? Yes Did it involve sudden or severe rash/hives, skin peeling, or any reaction on the inside of your mouth or nose? Unknown Did you need to seek medical attention at a hospital or doctor's office? Yes When did it last happen? 30 years ago If all above answers are "NO", may proceed with cephalosporin use.     Medications: I have reviewed the patient's current medications.  Results for orders placed or performed during the hospital encounter of 08/06/20 (from the past 48 hour(s))  Basic metabolic panel     Status: Abnormal   Collection Time: 08/06/20  2:03 PM  Result Value Ref Range   Sodium 136 135 - 145 mmol/L   Potassium 5.0 3.5 - 5.1 mmol/L   Chloride 104 98 - 111 mmol/L   CO2 21 (L) 22 - 32 mmol/L   Glucose, Bld 140 (H) 70 - 99 mg/dL    Comment: Glucose reference range applies only to samples taken after fasting for at least  8 hours.   BUN 25 (H) 8 - 23 mg/dL   Creatinine, Ser 1.03 0.61 - 1.24 mg/dL   Calcium 8.9 8.9 - 10.3 mg/dL   GFR, Estimated >60 >60 mL/min    Comment: (NOTE) Calculated using the CKD-EPI Creatinine Equation (2021)    Anion gap 11 5 - 15    Comment: Performed at [facility] [address], [place], [place] [address]    DG Hip Malvin Johns or [place] Pelvis 2-3 Views Right  Result Date: 08/06/2020 CLINICAL DATA:  [place] [address] while walking. Unable to bear weight on the right leg. EXAM: DG HIP (WITH OR WITHOUT PELVIS) 2-3V RIGHT COMPARISON:  None. FINDINGS: There is a mildly impacted but otherwise nondisplaced subcapital fracture of the right hip. The left hip is intact. The pubic symphysis and SI joints are intact. No pelvic fractures or bone lesions. Advanced vascular  calcifications. IMPRESSION: Mildly impacted but otherwise nondisplaced subcapital fracture of the right hip. Electronically Signed   By: Marijo Sanes M.D.   On: 08/06/2020 14:01    Review of Systems  HENT:  Negative for ear discharge, ear pain, hearing loss and tinnitus.   Eyes:  Negative for photophobia and pain.  Respiratory:  Negative for cough and shortness of breath.   Cardiovascular:  Negative for chest pain.  Gastrointestinal:  Negative for abdominal pain, nausea and vomiting.  Genitourinary:  Negative for dysuria, flank pain, frequency and urgency.  Musculoskeletal:  Positive for arthralgias (Right hip>right ankle). Negative for back pain, myalgias and neck pain.  Neurological:  Negative for dizziness and headaches.  Hematological:  Does not bruise/bleed easily.  Psychiatric/Behavioral:  The patient is not nervous/anxious.   Blood pressure 119/62, pulse 96, temperature 97.7 F (36.5 C), temperature source Oral, resp. rate (!) 21, height 5\' 9"  (1.753 m), weight 75.7 kg, SpO2 97 %. Physical Exam Constitutional:      General: He is not in acute distress.    Appearance: He is well-developed. He is not diaphoretic.  HENT:     Head: Normocephalic and atraumatic.  Eyes:     General: No scleral icterus.       Right eye: No discharge.        Left eye: No discharge.     Conjunctiva/sclera: Conjunctivae normal.  Cardiovascular:     Rate and Rhythm: Normal rate and regular rhythm.  Pulmonary:     Effort: Pulmonary effort is normal. No respiratory distress.  Musculoskeletal:     Cervical back: Normal range of motion.     Comments: RLE No traumatic wounds, ecchymosis, or rash  Mod TTP hip, mild TTP lat ankle  No knee or ankle effusion  Knee stable to varus/ valgus and anterior/posterior stress  Sens DPN, SPN, TN intact  Motor EHL, ext, flex, evers 5/5  DP 1+, PT 0, 2+ pitting edema  Skin:    General: Skin is warm and dry.  Neurological:     Mental Status: He is alert.   Psychiatric:        Mood and Affect: Mood normal.        Behavior: Behavior normal.    Assessment/Plan: Right hip fx -- Will probably need hip replacement once Eliquis washed out but will check CT to see if hip pinning an option. No surgery today, may have diet. Right ankle pain -- Will check x-rays Multiple medical problems including CKD, COPD, complete heart block status post pacemaker placement, and DVT on Eliquis -- Will need medical admission and clearance, appreciate  help. Please hold anticoagulants, bridge with heparin if needed.    Lisette Abu, PA-C Orthopedic Surgery [phone] 08/06/2020, 2:24 PM

## 2020-08-07 DIAGNOSIS — S72001A Fracture of unspecified part of neck of right femur, initial encounter for closed fracture: Secondary | ICD-10-CM

## 2020-08-07 DIAGNOSIS — E119 Type 2 diabetes mellitus without complications: Secondary | ICD-10-CM

## 2020-08-07 DIAGNOSIS — Z01810 Encounter for preprocedural cardiovascular examination: Secondary | ICD-10-CM

## 2020-08-07 DIAGNOSIS — I82409 Acute embolism and thrombosis of unspecified deep veins of unspecified lower extremity: Secondary | ICD-10-CM

## 2020-08-07 DIAGNOSIS — J439 Emphysema, unspecified: Secondary | ICD-10-CM | POA: Diagnosis not present

## 2020-08-07 DIAGNOSIS — I700 Atherosclerosis of aorta: Secondary | ICD-10-CM | POA: Diagnosis not present

## 2020-08-07 LAB — CBC
HCT: 42.2 % (ref 39.0–52.0)
Hemoglobin: 13.8 g/dL (ref 13.0–17.0)
MCH: 34.8 pg — ABNORMAL HIGH (ref 26.0–34.0)
MCHC: 32.7 g/dL (ref 30.0–36.0)
MCV: 106.6 fL — ABNORMAL HIGH (ref 80.0–100.0)
Platelets: 250 10*3/uL (ref 150–400)
RBC: 3.96 MIL/uL — ABNORMAL LOW (ref 4.22–5.81)
RDW: 12.9 % (ref 11.5–15.5)
WBC: 12.5 10*3/uL — ABNORMAL HIGH (ref 4.0–10.5)
nRBC: 0 % (ref 0.0–0.2)

## 2020-08-07 LAB — GLUCOSE, CAPILLARY: Glucose-Capillary: 146 mg/dL — ABNORMAL HIGH (ref 70–99)

## 2020-08-07 LAB — CBG MONITORING, ED
Glucose-Capillary: 165 mg/dL — ABNORMAL HIGH (ref 70–99)
Glucose-Capillary: 128 mg/dL — ABNORMAL HIGH (ref 70–99)
Glucose-Capillary: 194 mg/dL — ABNORMAL HIGH (ref 70–99)
Glucose-Capillary: 176 mg/dL — ABNORMAL HIGH (ref 70–99)

## 2020-08-07 LAB — APTT: aPTT: 30 seconds (ref 24–36)

## 2020-08-07 NOTE — Assessment & Plan Note (Addendum)
--   remains stable.  Metformin on hold during hospitalization. -- Sliding scale insulin.

## 2020-08-07 NOTE — Progress Notes (Signed)
[place] for Heparin Indication: atrial fibrillation and hx of DVT  Allergies  Allergen Reactions   Tetanus Toxoids Shortness Of Breath and Swelling   Aspirin Nausea Only and Other (See Comments)    headache   Penicillins Swelling    Did it involve swelling of the face/tongue/throat, SOB, or low BP? Yes Did it involve sudden or severe rash/hives, skin peeling, or any reaction on the inside of your mouth or nose? Unknown Did you need to seek medical attention at a hospital or doctor's office? Yes When did it last happen? 30 years ago If all above answers are "NO", may proceed with cephalosporin use.    Patient Measurements: Height: 5\' 9"  (175.3 cm) Weight: 75.7 kg (166 lb 14.2 oz) IBW/kg (Calculated) : 70.7 Heparin Dosing Weight: 75.7 kg  Medications:  Xarelto 15 mg PO daily. Last dose taken this AM at 8am  Assessment: Pt presents with hip pain s/p fall and MD is planning for hip surgery later this week. Pt is on chronic Xarelto for Afib and recurrent DVTs. Pt will be transitioning to heparin for anticipated surgery.   PTT 30 seconds this PM  Goal of Therapy:  Heparin level 0.3-0.7 units/ml aPTT 66-102 seconds Monitor platelets by anticoagulation protocol: Yes   Plan:  Increase heparin to 1300 units / hr Follow up AM heparin level, PTT  Thank you Anette Guarneri, PharmD  08/07/2020 7:13 PM

## 2020-08-07 NOTE — Hospital Course (Addendum)
87 year old man PMH including atrial fibrillation, complete heart block status post pacemaker implant 2013, CAD, prior MI, diastolic CHF, recurrent DVT, on apixaban, presented with hip pain after fall.  Admitted for closed right hip fracture.  Operative intervention delayed by anticoagulation.  S/p hip replacement 7/24.  Postoperative course uncomplicated.  Plan for SNF.

## 2020-08-07 NOTE — ED Notes (Signed)
Attempted report. Charge nurse states they have 3 admissions and cannot take the patient.

## 2020-08-07 NOTE — Progress Notes (Signed)
PROGRESS NOTE  Lance Smith WGN:[id number] DOB: [birth year] DOA: 08/06/2020 PCP: Burnard Bunting, MD  Brief History   87 year old man PMH including atrial fibrillation, complete heart block status post pacemaker implant 2013, CAD, prior MI, diastolic CHF, recurrent DVT, on apixaban, presented with hip pain after fall.  Admitted for closed right hip fracture.  A & P  * Closed right hip fracture (HCC) -- Status post mechanical fall at home. -- Orthopedics plans on definitive management, perhaps 7/22.  Apixaban on hold. -- Heparin drip perioperatively given history of recurrent DVTs. -- Hip fracture protocol for pain management, etc.  CAD (coronary artery disease),Prior IMI  -- Reports daily chest pain, mild in nature, does not take nitroglycerin for, intermittent, no specific aggravating or alleviating factors. -- Plavix on hold for surgery. -- Continue statin. -- Cardiology consultation for assistance with perioperative management.  Last saw Dr. Caryl Comes in May of this year, at that time troponin was mildly elevated.  Atrial fibrillation (HCC) -- Paced rhythm on EKG.  Continue amiodarone.  Type 2 diabetes mellitus without complication (HCC) -- Appears stable.  Metformin on hold during hospitalization. -- Sliding scale insulin.  DVT, lower extremity, recurrent (HCC) -- Apixaban on hold, heparin bridge for now  Diastolic heart failure (HCC) -- Appears euvolemic.  Continue oral Lasix. -- Last echocardiogram August 2021 with LVEF 60 to 65%.  Grade 1 diastolic dysfunction.  Emphysema lung (HCC) --stable. Bronchodilators as needed. Not on oxygen at home.  Aortic atherosclerosis ([place]) --continue statin  Complete heart block ([place]) -- Status post pacemaker insertion 2013.  EKG shows paced rhythm.  Disposition Plan:  Discussion:   Status is: Inpatient  Remains inpatient appropriate because:Inpatient level of care appropriate due to severity of illness  Dispo: The patient is from:  Home              Anticipated d/c is to: SNF              Patient currently is not medically stable to d/c.   Difficult to place patient No  DVT prophylaxis: heparin gtt     Code Status: Full Code Level of care: Telemetry Medical Family Communication: none  Murray Hodgkins, MD  [facility] Direct contact: see [email or web address] (further directions at bottom of note if needed) 7PM-7AM contact night coverage as at bottom of note 08/07/2020, 1:00 PM  LOS: 1 day   Significant Hospital Events   7/30 admit fo hip fx   Consults:  Cardiology    Procedures:    Interval History/Subjective  CC: f/u hip fx  C/o right hip and knee pain Breathing ok Former smoker Has daily CP, intermittent, never takes NTG  Objective   Vitals:  Vitals:   08/07/20 1100 08/07/20 1200  BP: 133/75 123/78  Pulse: 61 61  Resp: (!) 21 20  Temp:    SpO2: (!) 88%     Exam: Physical Exam Vitals and nursing note reviewed.  Constitutional:      Appearance: Normal appearance. He is not ill-appearing.  Cardiovascular:     Rate and Rhythm: Normal rate and regular rhythm.     Heart sounds: No murmur heard.   No gallop.  Pulmonary:     Effort: Pulmonary effort is normal. No respiratory distress.     Breath sounds: Normal breath sounds. No wheezing or rales.  Abdominal:     Palpations: Abdomen is soft.  Musculoskeletal:     Right lower leg: No edema.     Left lower leg: No edema.  Neurological:     Mental Status: He is alert.  Psychiatric:        Mood and Affect: Mood normal.        Behavior: Behavior normal.    I have personally reviewed the labs and other data, making special note of:   Today's Data  Hgb stable   Scheduled Meds:  amiodarone  200 mg Oral Daily   insulin aspart  0-9 Units Subcutaneous TID WC   multivitamin with minerals  1 tablet Oral Daily   nicotine  21 mg Transdermal Daily   predniSONE  10 mg Oral Q breakfast   simvastatin  40 mg Oral q morning   tamsulosin  0.4 mg  Oral Daily   [START ON 08/08/2020] torsemide  40 mg Oral QODAY   triamcinolone cream  1 application Topical BID   Continuous Infusions:  heparin 1,100 Units/hr (08/07/20 0815)    Principal Problem:   Closed right hip fracture (HCC) Active Problems:   Atrial fibrillation (HCC)   CAD (coronary artery disease),Prior IMI    Diastolic heart failure (HCC)   DVT, lower extremity, recurrent (HCC)   Type 2 diabetes mellitus without complication (HCC)   Complete heart block (HCC)   Tobacco abuse   Rash   Leukocytosis   Aortic atherosclerosis ([place])   Emphysema lung ([place])   LOS: 1 day   How to contact the [facility] Attending or Consulting provider 7A - 7P or covering provider during after hours [place], for this patient?  Check the care team in [facility] and look for a) attending/consulting TR[facility] provider listed and b) the [facility] team listed Log into [email or web address] and use [place]'s universal password to access. If you do not have the password, please contact the hospital operator. Locate the [facility] provider you are looking for under [facility] and page to a number that you can be directly reached. If you still have difficulty reaching the provider, please page the [facility] (Director on Call) for the Hospitalists listed on amion for assistance.

## 2020-08-07 NOTE — ED Notes (Signed)
Attempted report 

## 2020-08-07 NOTE — Assessment & Plan Note (Addendum)
--   Appears euvolemic.  Continue torsemide. -- Last echocardiogram August 2021 with LVEF 60 to 65%.  Grade 1 diastolic dysfunction.

## 2020-08-07 NOTE — Assessment & Plan Note (Addendum)
--   Status post mechanical fall at home. -- s/p surgery 7/24  -- resumed Xarelto -- Hip fracture protocol for pain management, etc. -- stable for SNF

## 2020-08-07 NOTE — Assessment & Plan Note (Addendum)
--   Paced rhythm on EKG.  Stable, continue amiodarone.

## 2020-08-07 NOTE — Assessment & Plan Note (Signed)
--   Status post pacemaker insertion 2013.  EKG shows paced rhythm.

## 2020-08-07 NOTE — Assessment & Plan Note (Addendum)
--   Seen by cardiology, noted to be at high risk for surgery but cleared without further intervention or evaluation. -- Plavix resumed, continue statin.

## 2020-08-07 NOTE — Consult Note (Addendum)
Cardiology Consultation:   Patient ID: Lance Smith; [id number]; March 31, 1931   Admit date: 08/06/2020 Date of Consult: 08/07/2020  Primary Care Provider: Burnard Bunting, MD Primary Cardiologist: Dr. Caryl Comes  Patient Profile:   Lance Smith is a 87 y.o. male with a hx of acute on chronic diastolic HF, persistent atrial fibrillation on AC with Xarelto, h/o CHB s/p PPM followed by Dr. Caryl Comes, tobacco abuse, COPD, CKD, h/o DVT, HTN and HLD. He has no known CAD. He is being see today for pre-operative cardiac clearance at the request of Dr. Sarajane Jews.  History of Present Illness:   Lance Smith is an 87yo M with a hx as stated above who presented to [facility] after a mechanical fall with resultant closed hip fracture. Ortho consulted with tentative plans for surgery 08/08/20. He follows with Dr. Caryl Comes for his cardiology care. He has a hx of complete heart block s/p PPM implantation. He has a long hx of persistent atrial fibrillation with last DCCV noted to be 05/2018 supported by Amiodarone which was initially successful however on follow up had quickly returned to AF. Notes indicate plan was to manage with rate control and anticoagulation. He has issues with chronic, unchanged SOB not necessarily related to his AF felt tot be more in the setting of ongoing tobacco use and COPD. He was last seen by Dr. Caryl Comes 06/04/20 at which time he had complaints of prolonged chest pain lasting about 6 hours in duration, felt to be indigestion. He was having associated SOB but no diaphoresis. He was scheduled for a CTA to rule out dissection. This was performed 06/04/20 which was negative with no evidence of aortic aneurysm or dissection. There was atherosclerosis noted on imaging however no specifically noted to be aortic or coronary. Troponin was elevated at 43 with a repeat at 45. He was placed on prophylactic Plavix however the patient reports he never started this medication. In talking with the patient today, he reports that  several days prior to his visit with Dr. Caryl Comes, he suffered a fall and hit. Looking back at the situation, he feels his symptoms were more in the setting of muscle pain from the fall and not cardiac in etiology. He has not had recurrent chest pain since that time.  In regards to his pre surgical activity. He reports that he is a a primary caregiver to his 85yo special needs son. He helps bath,dress and feed him on a daily basis which does not illicit cardiac symptoms. He does all the grocery shopping for his family without difficulty. He continues to smoke and has some chronic unchanged SOB. His last echocardiogram was 08/27/2019 which showed an LVEF at 60-65% with no RWMA and no  valvular disease. ETT performed 08/01/2019 showed no evidence of ischemia.   Past Medical History:  Diagnosis Date   Adenomatous colon polyp 01/2002   Arthritis    CKD (chronic kidney disease) stage 3, GFR 30-59 ml/min (HCC)    Complete heart block ([place])    a. 05/2011 s/p SJM Model [id number] Dual Chamber PPM   COPD (chronic obstructive pulmonary disease) (HCC)    Diabetes mellitus (HCC)    Diastolic CHF ([place])    a. 03/6142 Echo: EF 55-60%   DVT (deep venous thrombosis), unspecified laterality    a. previously on coumadin - d/c'd spring of 2013   Hyperglycemia    Hyperlipidemia    Hypertension    Inferior myocardial infarction ([place]) 08/22/2013   Steroid dependence ([place])  Tobacco abuse     Past Surgical History:  Procedure Laterality Date   bilateral inguinal hernia     CARDIOVERSION N/A 06/16/2018   Procedure: CARDIOVERSION;  Surgeon: Fay Records, MD;  Location: [facility] ENDOSCOPY;  Service: Cardiovascular;  Laterality: N/A;   MELANOMA EXCISION     PACEMAKER INSERTION  May 2013   PERMANENT PACEMAKER INSERTION Left 05/20/2011   Procedure: PERMANENT PACEMAKER INSERTION;  Surgeon: Deboraha Sprang, MD;  Location: [facility] CATH LAB;  Service: Cardiovascular;  Laterality: Left;     Prior to Admission medications   Medication Sig  Start Date End Date Taking? Authorizing Provider  clopidogrel (PLAVIX) 75 MG tablet Take 1 tablet (75 mg total) by mouth daily. 06/04/20  Yes Deboraha Sprang, MD  fluocinonide ointment (LIDEX) 0.10 % Apply 1 application topically 2 (two) times daily. 07/25/20  Yes [provider]  metFORMIN (GLUCOPHAGE) 500 MG tablet Take 500 mg by mouth 2 (two) times daily with a meal. 07/13/15  Yes [provider]  Multiple Vitamin (MULTIVITAMIN WITH MINERALS) TABS tablet Take 1 tablet by mouth daily. Centrum Silver   Yes [provider]  nitroGLYCERIN (NITROSTAT) 0.4 MG SL tablet Place 0.4 mg under the tongue every 5 (five) minutes as needed. For chest pain   Yes [provider]  [facility] VERIO test strip 1 each by Other route See admin instructions. Once or twice daily 08/05/20  Yes [provider]  PACERONE 200 MG tablet TAKE 2 TABLETS BY MOUTH TWICE DAILY FOR 2 WEEKS AND 1 TWICE DAILY FOR 2 WEEKS AND 1 ONCE DAILY Patient taking differently: Take 200 mg by mouth See admin instructions. TAKE 2 TABLETS BY MOUTH TWICE DAILY FOR 2 WEEKS AND 1 TWICE DAILY FOR 2 WEEKS AND 1 ONCE DAILY 04/09/19  Yes Baldwin [place], PA-C  predniSONE (DELTASONE) 10 MG tablet Take 10 mg by mouth daily with breakfast.   Yes [provider]  PROAIR HFA 108 (90 Base) MCG/ACT inhaler Inhale 2 puffs into the lungs See admin instructions. Inhale 2 puffs by mouth twice daily & every 4 hours as needed for wheezing or shortness of breath. 05/05/18  Yes [provider]  Rivaroxaban (XARELTO) 15 MG TABS tablet Take 1 tablet (15 mg total) by mouth daily. 10/20/18  Yes Shirley Friar, PA-C  simvastatin (ZOCOR) 40 MG tablet Take 40 mg by mouth every morning.  07/15/10  Yes [provider]  tamsulosin (FLOMAX) 0.4 MG CAPS capsule Take 0.4 mg by mouth daily. 02/27/18  Yes [provider]  torsemide (DEMADEX) 20 MG tablet Take 2 tablets (40 mg total) by mouth every other day.  Take 40mg  -  2  tablets daily by mouth x 3 days then 40mg  - 2 tablets by mouth every other day. Patient taking differently: Take 40 mg by mouth every other day. 10/22/19  Yes Deboraha Sprang, MD  triamcinolone cream (KENALOG) 0.1 % Apply 1 application topically 2 (two) times daily. 07/09/20  Yes [provider]    Inpatient Medications: Scheduled Meds:  amiodarone  200 mg Oral Daily   insulin aspart  0-9 Units Subcutaneous TID WC   multivitamin with minerals  1 tablet Oral Daily   nicotine  21 mg Transdermal Daily   predniSONE  10 mg Oral Q breakfast   simvastatin  40 mg Oral q morning   tamsulosin  0.4 mg Oral Daily   [START ON 08/08/2020] torsemide  40 mg Oral QODAY   triamcinolone cream  1  application Topical BID   Continuous Infusions:  heparin 1,100 Units/hr (08/07/20 0815)   PRN Meds: acetaminophen, albuterol, HYDROcodone-acetaminophen, morphine injection, nitroGLYCERIN  Allergies:    Allergies  Allergen Reactions   Tetanus Toxoids Shortness Of Breath and Swelling   Aspirin Nausea Only and Other (See Comments)    headache   Penicillins Swelling    Did it involve swelling of the face/tongue/throat, SOB, or low BP? Yes Did it involve sudden or severe rash/hives, skin peeling, or any reaction on the inside of your mouth or nose? Unknown Did you need to seek medical attention at a hospital or doctor's office? Yes When did it last happen? 30 years ago If all above answers are "NO", may proceed with cephalosporin use.     Social History:   Social History   Socioeconomic History   Marital status: Married    Spouse name: Not on file   Number of children: Not on file   Years of education: Not on file   Highest education level: Not on file  Occupational History   Occupation: retired  Tobacco Use   Smoking status: Every Day    Packs/day: 2.00    Types: Cigarettes   Smokeless tobacco: Never  Vaping Use   Vaping Use: Never used  Substance and Sexual Activity    Alcohol use: No    Alcohol/week: 0.0 standard drinks   Drug use: No   Sexual activity: Not Currently  Other Topics Concern   Not on file  Social History Narrative   Not on file   Social Determinants of Health   Financial Resource Strain: Not on file  Food Insecurity: Not on file  Transportation Needs: Not on file  Physical Activity: Not on file  Stress: Not on file  Social Connections: Not on file  Intimate Partner Violence: Not on file    Family History:   Family History  Problem Relation Age of Onset   Leukemia Mother    Colon cancer Neg Hx    Family Status:  Family Status  Relation Name Status   Mother  Deceased   Father  Deceased   MGM  Deceased   MGF  Deceased   PGM  Deceased   PGF  Deceased   Neg Hx  (Not Specified)    ROS:  Please see the history of present illness.  All other ROS reviewed and negative.     Physical Exam/Data:   Vitals:   08/07/20 0845 08/07/20 0900 08/07/20 1100 08/07/20 1200  BP:  135/62 133/75 123/78  Pulse: 85 73 61 61  Resp: (!) 24 (!) 22 (!) 21 20  Temp:      TempSrc:      SpO2: (!) 79% 91% (!) 88%   Weight:      Height:       No intake or output data in the 24 hours ending 08/07/20 1340 Filed Weights   08/06/20 1220  Weight: 75.7 kg   Body mass index is 24.65 kg/m.   General: Well developed, well nourished, NAD Neck: Negative for carotid bruits. No JVD Lungs: Diminished in bilateral upper and lower lobes. Breathing is unlabored. Cardiovascular: RRR with S1 S2. No murmurs Abdomen: Soft, non-tender, non-distended Extremities: No edema. Neuro: Alert and oriented. No focal deficits. No facial asymmetry. MAE spontaneously. Psych: Responds to questions appropriately with normal affect.     EKG:  The EKG was personally reviewed and demonstrates: 08/06/20 NSR with V pacing with HR 62bpm  Telemetry:  Telemetry was personally  reviewed and demonstrates:  08/07/20 NSR with V pacing  Relevant CV Studies:  Echocardiogram  08/27/2019:   1. Left ventricular ejection fraction, by estimation, is 60 to 65%. The  left ventricle has normal function. The left ventricle has no regional  wall motion abnormalities. The left ventricular internal cavity size was  moderately dilated. There is mild  left ventricular hypertrophy. Left ventricular diastolic parameters are  consistent with Grade I diastolic dysfunction (impaired relaxation).   2. Right ventricular systolic function is normal. The right ventricular  size is normal.   3. Left atrial size was moderately dilated.   4. The mitral valve is normal in structure. No evidence of mitral valve  regurgitation. No evidence of mitral stenosis.   5. The aortic valve is tricuspid. Aortic valve regurgitation is mild.  Mild to moderate aortic valve sclerosis/calcification is present, without  any evidence of aortic stenosis.   6. The inferior vena cava is normal in size with greater than 50%  respiratory variability, suggesting right atrial pressure of 3 mmHg.   Laboratory Data:  Chemistry Recent Labs  Lab 08/06/20 1403  NA 136  K 5.0  CL 104  CO2 21*  GLUCOSE 140*  BUN 25*  CREATININE 1.03  CALCIUM 8.9  GFRNONAA >60  ANIONGAP 11    Total Protein  Date Value Ref Range Status  06/04/2020 6.5 6.0 - 8.5 g/dL Final   Albumin  Date Value Ref Range Status  06/04/2020 3.9 3.6 - 4.6 g/dL Final   AST  Date Value Ref Range Status  06/04/2020 17 0 - 40 IU/L Final   ALT  Date Value Ref Range Status  06/04/2020 19 0 - 44 IU/L Final   Alkaline Phosphatase  Date Value Ref Range Status  06/04/2020 90 44 - 121 IU/L Final   Bilirubin Total  Date Value Ref Range Status  06/04/2020 0.4 0.0 - 1.2 mg/dL Final   Hematology Recent Labs  Lab 08/06/20 1403 08/07/20 0346  WBC 13.1* 12.5*  RBC 3.72* 3.96*  HGB 13.3 13.8  HCT 40.3 42.2  MCV 108.3* 106.6*  MCH 35.8* 34.8*  MCHC 33.0 32.7  RDW 12.9 12.9  PLT 248 250   Cardiac EnzymesNo results for input(s):  TROPONINI in the last 168 hours. No results for input(s): TROPIPOC in the last 168 hours.  BNPNo results for input(s): BNP, PROBNP in the last 168 hours.  DDimer No results for input(s): DDIMER in the last 168 hours. TSH:  Lab Results  Component Value Date   TSH 0.881 06/04/2020   Lipids:No results found for: CHOL, HDL, LDLCALC, LDLDIRECT, TRIG, CHOLHDL HgbA1c: Lab Results  Component Value Date   HGBA1C 7.0 (H) 08/06/2020    Radiology/Studies:  DG Ankle Complete Right  Result Date: 08/06/2020 CLINICAL DATA:  Fall with right ankle pain EXAM: RIGHT ANKLE - COMPLETE 3+ VIEW COMPARISON:  None. FINDINGS: Lateral soft tissue swelling. No definitive fracture or malalignment. Ankle mortise is symmetric. IMPRESSION: Soft tissue swelling.  No definite acute osseous abnormality Electronically Signed   By: Donavan Foil M.D.   On: 08/06/2020 15:31   CT HIP RIGHT WO CONTRAST  Result Date: 08/06/2020 CLINICAL DATA:  Right hip pain after a fall while walking today. Initial encounter. EXAM: CT OF THE RIGHT HIP WITHOUT CONTRAST TECHNIQUE: Multidetector CT imaging of the right hip was performed according to the standard protocol. Multiplanar CT image reconstructions were also generated. COMPARISON:  Plain films right hip today. FINDINGS: Bones/Joint/Cartilage As seen on the comparison examination, the  patient has a mildly impacted subcapital fracture of the right hip. There is also mild anterior displacement of the femoral neck. No other fracture is identified. The hip is located. No lytic or sclerotic lesion is identified. Mild appearing right hip osteoarthritis noted. Ligaments Suboptimally assessed by CT. Muscles and Tendons Appear intact. Soft tissues Prostatomegaly.  Sigmoid diverticulosis. IMPRESSION: Mildly impacted and anteriorly displaced subcapital fracture right hip. No other acute finding. Mild right hip osteoarthritis. Prostatomegaly. Sigmoid diverticulosis. Electronically Signed   By: Inge Rise M.D.   On: 08/06/2020 14:58   DG Hip Unilat W or Wo Pelvis 2-3 Views Right  Result Date: 08/06/2020 CLINICAL DATA:  [place] while walking. Unable to bear weight on the right leg. EXAM: DG HIP (WITH OR WITHOUT PELVIS) 2-3V RIGHT COMPARISON:  None. FINDINGS: There is a mildly impacted but otherwise nondisplaced subcapital fracture of the right hip. The left hip is intact. The pubic symphysis and SI joints are intact. No pelvic fractures or bone lesions. Advanced vascular calcifications. IMPRESSION: Mildly impacted but otherwise nondisplaced subcapital fracture of the right hip. Electronically Signed   By: Marijo Sanes M.D.   On: 08/06/2020 14:01    Assessment and Plan:   1. Pre-operative cardiac optimization: -Pt presented to [facility] after a mechanical fall with resultant closed hip fracture>>ortho consulted with tentative plans for surgery 08/08/20.  -Follows with Dr. Caryl Comes for his cardiology care with a hx of complete heart block s/p PPM implantation. He has a long hx of persistent atrial fibrillation with last DCCV noted to be 05/2018 supported by Amiodarone which was initially successful however on follow up had quickly returned to AF. Notes indicate plan was to manage with rate control and anticoagulation. He has issues with chronic, unchanged SOB not necessarily related to his AF felt to be more in the setting of ongoing tobacco use and COPD.  -Last seen by Dr. Caryl Comes 06/04/20 at which time he had complaints of prolonged chest pain lasting about 6 hours in duration, felt to be indigestion. He was having associated SOB but no diaphoresis>>>CTA 06/04/20 was negative with no evidence of aortic aneurysm or dissection. There was atherosclerosis noted on imaging however no specifically noted to be aortic or coronary. Troponin was elevated at 43 with a repeat at 45. He was placed on prophylactic Plavix however the patient reports he never started this medication. Pt reports that several days prior to his visit  with Dr. Caryl Comes, he suffered a fall. Looking back at the situation, he feels his symptoms were more in the setting of muscle pain from the fall and not cardiac in etiology. He has not had recurrent chest pain since that time. -Pt is a primary caregiver to his 85yo special needs son>>helps bathing, dressing and feeding him on a daily basis which does not illicit cardiac symptoms. He does all the grocery shopping for his family without difficulty. He continues to smoke and has some chronic unchanged SOB.  -Last echocardiogram was 08/27/2019 which showed an LVEF at 60-65% with no RWMA and no  valvular disease.  -ETT performed 08/01/2019 showed no evidence of ischemia.  -The patient does not have any unstable cardiac conditions however has cardiac risk factors including tobacco use, atherosclerosis, DM, HLD, and HTN. Upon evaluation today, he can achieve 4 METs or greater without anginal symptoms.  According to [facility] and AHA guidelines, he requires no further cardiac workup prior to his noncardiac surgery and should be at higher but acceptable risk acceptable risk. His 30-day revised  cardiac risk of peri-procedural MI or cardiac arrest following surgery moderate at 10.1%    2. Closed right hip fracture: -Presented after a mechanical fall>>found to have a right intracapsular hip fracture. Due to Xarelto, plan was to discontinue and start IV Heparin prior to surgery.  -Management per ortho   3. Persistent atrial fibrillation: -Has a hx of persistent AF with prior failed cardioversion on amiodarone 200mg  QD  -Currently anticoagulated with IV heparin   4. Chronic diastolic CHF: -Last echocardiogram with stable LVEF and G1 DD 08/2019 -Appears euvolemic on exam today  -On Torsemide 40mg  every other day  -Cr stable   5. Hx of DVT: -On PTA Xarelto>>transitioned to IV Heparin   6. Hx of CHB: -s/p PPM followed by Dr. Caryl Comes -Last interrogation 06/26/20 showed abnormal reading>>>battery approaching ERI with 9 months  life left -V pacing on telemetry   7. COPD and ongoing tobacco use: -Continue current regimen   For questions or updates, please contact [place] Please consult [email or web address] for contact info under Cardiology/STEMI.   Lyndel Safe NP-C [facility] Pager: [phone] 08/07/2020 1:41 PM  Patient seen and examined with Kathyrn Drown NP-C.  Agree as above, with the following exceptions and changes as noted below. 87 yo male who sustained a mechanical fall from seated level now with hip fracture.  There are plans for operative management of his hip fracture, and cardiovascular risk stratification was requested.  History of complete heart block status post permanent pacemaker, persistent atrial fibrillation on amiodarone.  He denies any current chest pain and is active in his activities of daily living, including caring for his 85 year old special needs son for whom he does many of his ADLs.  Gen: NAD, CV: RRR, no murmurs, Lungs: clear, Abd: soft, Extrem: Warm, well perfused, no edema, Neuro/Psych: alert and oriented x 3, normal mood and affect. All available labs, radiology testing, previous records reviewed.  ECG demonstrates a ventricular paced rhythm.  The patient is intermediate-high risk for intermediate risk procedure.  Cardiovascular risk factors include ongoing tobacco use, atherosclerosis, diabetes mellitus, hyperlipidemia, hypertension.  He does not however have any current chest pain and can complete >4 METS. No further cardiovascular testing is required prior to the procedure.  If this level of risk is acceptable to the patient and surgical team, the patient should be considered optimized from a cardiovascular standpoint.  Patient understands this risk level and is willing to proceed.  Currently on IV heparin while off of Xarelto for history of DVT and anticoagulation for atrial fibrillation.  Patient's pacemaker generator is approaching ERI, he continues to follow with Dr.  Caryl Comes.   Elouise Munroe, MD 08/07/20 6:05 PM

## 2020-08-07 NOTE — Assessment & Plan Note (Addendum)
--  remains stable. Bronchodilators as needed. Not on oxygen at home.

## 2020-08-07 NOTE — Assessment & Plan Note (Signed)
continue statin

## 2020-08-07 NOTE — Assessment & Plan Note (Addendum)
-    continue Xarelto. 

## 2020-08-07 NOTE — ED Notes (Signed)
Tele  Breakfast Ordered 

## 2020-08-08 DIAGNOSIS — I4891 Unspecified atrial fibrillation: Secondary | ICD-10-CM

## 2020-08-08 DIAGNOSIS — I82409 Acute embolism and thrombosis of unspecified deep veins of unspecified lower extremity: Secondary | ICD-10-CM | POA: Diagnosis not present

## 2020-08-08 DIAGNOSIS — S72001A Fracture of unspecified part of neck of right femur, initial encounter for closed fracture: Secondary | ICD-10-CM | POA: Diagnosis not present

## 2020-08-08 DIAGNOSIS — E119 Type 2 diabetes mellitus without complications: Secondary | ICD-10-CM | POA: Diagnosis not present

## 2020-08-08 LAB — HEPARIN LEVEL (UNFRACTIONATED)
Heparin Unfractionated: 0.27 IU/mL — ABNORMAL LOW (ref 0.30–0.70)
Heparin Unfractionated: 0.75 IU/mL — ABNORMAL HIGH (ref 0.30–0.70)

## 2020-08-08 LAB — SURGICAL PCR SCREEN
MRSA, PCR: NEGATIVE
Staphylococcus aureus: NEGATIVE

## 2020-08-08 LAB — APTT
aPTT: 30 seconds (ref 24–36)
aPTT: 99 seconds — ABNORMAL HIGH (ref 24–36)

## 2020-08-08 LAB — CBC
HCT: 35.6 % — ABNORMAL LOW (ref 39.0–52.0)
Hemoglobin: 11.9 g/dL — ABNORMAL LOW (ref 13.0–17.0)
MCH: 34.6 pg — ABNORMAL HIGH (ref 26.0–34.0)
MCHC: 33.4 g/dL (ref 30.0–36.0)
MCV: 103.5 fL — ABNORMAL HIGH (ref 80.0–100.0)
Platelets: 220 10*3/uL (ref 150–400)
RBC: 3.44 MIL/uL — ABNORMAL LOW (ref 4.22–5.81)
RDW: 13.1 % (ref 11.5–15.5)
WBC: 11.5 10*3/uL — ABNORMAL HIGH (ref 4.0–10.5)
nRBC: 0 % (ref 0.0–0.2)

## 2020-08-08 LAB — GLUCOSE, CAPILLARY
Glucose-Capillary: 130 mg/dL — ABNORMAL HIGH (ref 70–99)
Glucose-Capillary: 142 mg/dL — ABNORMAL HIGH (ref 70–99)
Glucose-Capillary: 247 mg/dL — ABNORMAL HIGH (ref 70–99)
Glucose-Capillary: 133 mg/dL — ABNORMAL HIGH (ref 70–99)

## 2020-08-08 MED ORDER — ATORVASTATIN CALCIUM 10 MG PO TABS
20.0000 mg | ORAL_TABLET | Freq: Every day | ORAL | Status: DC
  Administered 2020-08-09 – 2020-08-16 (×8): 20 mg via ORAL
  Filled 2020-08-08 (×8): qty 2

## 2020-08-08 MED ORDER — GLUCERNA SHAKE PO LIQD
237.0000 mL | Freq: Three times a day (TID) | ORAL | Status: DC
  Administered 2020-08-08 – 2020-08-14 (×10): 237 mL via ORAL

## 2020-08-08 NOTE — TOC Initial Note (Signed)
Transition of Care [facility]) - Initial/Assessment Note    Patient Details  Name: Lance Smith MRN: [id number] Date of Birth: 10-01-1931  [facility]) CM/SW Contact:    Sharin Mons, RN Phone Number: 08/08/2020, 4:08 PM  Clinical Narrative:                 Admitted s/p fall, c/o of hip pain. From home with family. PTA independent with ADL's, used a cane intermittently. PCP: Burnard Bunting MD  Plan: [place] Sunday by Dr. Lyla Glassing  [facility] team monitoring and will assist with TOC needs.  Expected Discharge Plan: [place] (vs SNF) Barriers to Discharge: Continued Medical Work up   Patient Goals and CMS Choice        Expected Discharge Plan and Services Expected Discharge Plan: [place] (vs SNF)                                              Prior Living Arrangements/Services     Patient language and need for interpreter reviewed:: Yes Do you feel safe going back to the place where you live?: Yes      Need for Family Participation in Patient Care: Yes (Comment) Care giver support system in place?: Yes (comment)   Criminal Activity/Legal Involvement Pertinent to Current Situation/Hospitalization: No - Comment as needed  Activities of Daily Living      Permission Sought/Granted   Permission granted to share information with : Yes, Verbal Permission Granted  Share Information with NAME: Prem Jacobucci (Spouse)  [phone]           Emotional Assessment Appearance:: Appears stated age Attitude/Demeanor/Rapport: Engaged Affect (typically observed): Accepting Orientation: : Oriented to Self, Oriented to Place, Oriented to  Time, Oriented to Situation [facility]) Alcohol / Substance Use: Tobacco Use (Smokes a pack /day. Declines resources to stop smoking) Psych Involvement: No (comment)  Admission diagnosis:  Ankle pain [M25.579] Closed right hip fracture (HCC) [S72.001A] Closed subcapital fracture of right femur,  initial encounter ([place]) [S72.011A] Patient Active Problem List   Diagnosis Date Noted   Aortic atherosclerosis ([place]) 08/07/2020   Emphysema lung ([place]) 08/07/2020   Closed right hip fracture ([place]) 08/06/2020   DVT, lower extremity, recurrent ([place]) 08/06/2020   Rash 08/06/2020   Leukocytosis 08/06/2020   Type 2 diabetes mellitus without complication ([place]) [id number]   CAD (coronary artery disease),Prior IMI  08/22/2013   Inferior myocardial infarction ([place]) 08/22/2013   Hx of adenomatous colonic polyps 07/16/2013   Atrial fibrillation ([place]) [id number]   Diastolic heart failure ([place]) 06/29/2011   Hyperglycemia 05/24/2011   Urinary retention 05/21/2011   Steroid dependence ([place]) 05/21/2011   Pacemaker-St.Jude 05/21/2011   Complete heart block ([place]) 05/20/2011   Tobacco abuse 05/20/2011   PCP:  Burnard Bunting, MD Pharmacy:   [facility] ([place]), [place] [id number] [place] ([place]) [place] [address] Phone: [phone] Fax: [phone]     Social Determinants of Health (SDOH) Interventions    Readmission Risk Interventions No flowsheet data found.

## 2020-08-08 NOTE — Progress Notes (Signed)
Initial Nutrition Assessment  DOCUMENTATION CODES:   Not applicable  INTERVENTION:   -Glucerna Shake po TID, each supplement provides 220 kcal and 10 grams of protein  -MVI with minerals daily -Advanced diet to dysphagia 3 diet; received permission from MD to advance diet  NUTRITION DIAGNOSIS:   Increased nutrient needs related to post-op healing as evidenced by estimated needs.  GOAL:   Patient will meet greater than or equal to 90% of their needs  MONITOR:   PO intake, Supplement acceptance, Labs, Weight trends, Skin, I & O's  REASON FOR ASSESSMENT:   Consult Assessment of nutrition requirement/status, Hip fracture protocol  ASSESSMENT:   Lance Smith is a 87 y.o. male with medical history significant of atrial fibrillation, complete heart block s/p pacemaker implant in 2013, atrial fibrillation , CAD with prior MI, diastotlic heart failure and recurrent DVT on eliquis and DM2 who presented for hip pain after a fall.  He was bending forward to pick something up and kept going. He put out his arm and did not hit his head. He had immediate pain in his right hip. Rated pain as 10/10 and "real bad." No radiation of pain. He was able to get up and walk to a few steps to a chair and then unable to bear weight on his right leg. Denies any syncope, chest pain, palpitations, fever/chills, shortness of breath, cough or leg swelling. Does complain of some ankle pain on the right.  Pt admitted with closed rt hip fracture.   Reviewed I/O's: -332 ml x 24 hours  UOP: 1.1 L x 24 hours  Per orthopedics notes, plan for rt hip arthroplasty on Sunday, 08/10/20.    Spoke with pt at bedside, who is extremely hard of hearing. He is upset because he is hungry and requesting coffee (noted NPO order- RD messaged MD and received approval to advance diet).   Pt reports that he has "never been a big eater at baseline" and has decreased carbohydrates in his diet secondary to poor blood sugar control.  He also complains of a rash on his chest and legs, which he is following a dermatologist for. Pt usually consumes 3 meals per day (Breakfast: eggs and toast; Lunch: crackers and applesauce; Dinner: meat and vegetable). Pt also consumes water and diet cola.   Reviewed wt hx; pt has experienced a 6.2% wt loss over the past year, which is not significant for time frame. Pt estimates he has lost about 30 pounds in the past year due to uncontrolled DM. He checks his CBGS and readings usually run between 100-200 at home. Suspect prednisone may be elevated blood sugar readings.   Discussed importance of good meal and supplement intake to promote healing  Medications reviewed and include prednisone.   Lab Results  Component Value Date   HGBA1C 7.0 (H) 08/06/2020   PTA DM medications are 500 mg metformin BID.   Labs reviewed: CBGS: 130-146 (inpatient orders for glycemic control are 0-9 units insulin aspart TID with meals).    NUTRITION - FOCUSED PHYSICAL EXAM:  Flowsheet Row Most Recent Value  Orbital Region No depletion  Upper Arm Region Mild depletion  Thoracic and Lumbar Region No depletion  Buccal Region No depletion  Temple Region No depletion  Clavicle Bone Region No depletion  Clavicle and Acromion Bone Region No depletion  Scapular Bone Region No depletion  Dorsal Hand No depletion  Patellar Region Mild depletion  Anterior Thigh Region Mild depletion  Posterior Calf Region Mild depletion  Edema (RD Assessment) None  Hair Reviewed  Eyes Reviewed  Mouth Reviewed  Skin Reviewed  Nails Reviewed       Diet Order:   Diet Order             DIET DYS 3 Room service appropriate? Yes with Assist; Fluid consistency: Thin  Diet effective now                   EDUCATION NEEDS:   Education needs have been addressed  Skin:  Skin Assessment: Reviewed RN Assessment  Last BM:  08/05/20  Height:   Ht Readings from Last 1 Encounters:  08/06/20 '5\' 9"'$  (1.753 m)    Weight:    Wt Readings from Last 1 Encounters:  08/06/20 75.7 kg    Ideal Body Weight:  72.7 kg  BMI:  Body mass index is 24.65 kg/m.  Estimated Nutritional Needs:   Kcal:  1850-2050  Protein:  90-105 grams  Fluid:  > 1.8 L    Loistine Chance, RD, LDN, [place] Registered Dietitian II Certified Diabetes Care and Education Specialist Please refer to [facility] for RD and/or RD on-call/weekend/after hours pager

## 2020-08-08 NOTE — Progress Notes (Addendum)
[place] for Heparin Indication: atrial fibrillation and hx of DVT  Allergies  Allergen Reactions   Tetanus Toxoids Shortness Of Breath and Swelling   Aspirin Nausea Only and Other (See Comments)    headache   Penicillins Swelling    Did it involve swelling of the face/tongue/throat, SOB, or low BP? Yes Did it involve sudden or severe rash/hives, skin peeling, or any reaction on the inside of your mouth or nose? Unknown Did you need to seek medical attention at a hospital or doctor's office? Yes When did it last happen? 30 years ago If all above answers are "NO", may proceed with cephalosporin use.    Patient Measurements: Height: '5\' 9"'$  (175.3 cm) Weight: 75.7 kg (166 lb 14.2 oz) IBW/kg (Calculated) : 70.7 Heparin Dosing Weight: 75.7 kg  Assessment: Patient presents with hip pain s/p fall and MD is planning for hip surgery.  He is on chronic Xarelto for Afib and recurrent DVTs.  Pharmacy consulted to dose IV heparin.  aPTT subtherapeutic at 30 seconds.  RN reports heparin was held around 0300 and lab was drawn 30 minutes later.  Line infiltrated and heparin was resumed an hour later when new IV line was placed.  No overt bleeding.  Goal of Therapy:  Heparin level 0.3-0.7 units/ml aPTT 66-102 seconds Monitor platelets by anticoagulation protocol: Yes   Plan:  Continue heparin gtt at 1300 units/hr Check 8 hr aPTT Daily aPTT, heparin level and CBC F/u with surgical plans  Prosper Paff D. Mina Marble, PharmD, BCPS, BCCCP 08/08/2020, 7:13 AM  ==========================================  Addendum: aPTT therapeutic and heparin level is slightly elevated Lab drawn appropriately and no bleeding noted while visiting  Reduce heparin gtt to 1250 units/hr F/U AM labs, could likely stop following aPTT post tomorrow  Chloe Bluett D. Mina Marble, PharmD, BCPS, [place] 08/08/2020, 2:43 PM

## 2020-08-08 NOTE — TOC CAGE-AID Note (Signed)
Transition of Care [facility]) - CAGE-AID Screening   Patient Details  Name: Lance Smith MRN: [id number] Date of Birth: 09-23-1931  Transition of Care [facility]) CM/SW Contact:    Gaetano Hawthorne Tarpley-Carter, [place] Phone Number: 08/08/2020, 2:07 PM   Clinical Narrative: Pt participated in [place].  Pt stated he does not use substance or ETOH. Pt was not offered resources, due to none use.    Dariel Betzer Tarpley-Carter, MSW, LCSW-A Pronouns:  She/Her/Hers [facility] Clinical Social Worker Direct Number:  [phone] [name]   CAGE-AID Screening:    Have You Ever Felt You Ought to Cut Down on Your Drinking or Drug Use?: No Have People Annoyed You By [employer] Your Drinking Or Drug Use?: No Have You Felt Bad Or Guilty About Your Drinking Or Drug Use?: No Have You Ever Had a Drink or Used Drugs First Thing In The Morning to Steady Your Nerves or to Get Rid of a Hangover?: No CAGE-AID Score: 0  Substance Abuse Education Offered: No

## 2020-08-08 NOTE — Plan of Care (Signed)

## 2020-08-08 NOTE — Progress Notes (Signed)
PROGRESS NOTE  Lance Smith [id number] DOB: [birth year] DOA: 08/06/2020 PCP: Burnard Bunting, MD  Brief History   87 year old man PMH including atrial fibrillation, complete heart block status post pacemaker implant 2013, CAD, prior MI, diastolic CHF, recurrent DVT, on apixaban, presented with hip pain after fall.  Admitted for closed right hip fracture.  Operative intervention delayed by anticoagulation.  Plan for hip replacement 7/24.  A & P  * Closed right hip fracture (HCC) -- Status post mechanical fall at home. -- Orthopedics plans on definitive management 7/24  Apixaban on hold. -- Heparin drip perioperatively given history of recurrent DVTs. -- Hip fracture protocol for pain management, etc.  CAD (coronary artery disease),Prior IMI  -- Reported chest pain mild in nature.  Seen by cardiology, noted to be at high risk for surgery but cleared without further intervention or evaluation. -- Plavix on hold for surgery. -- Continue statin. -- Resume Plavix when cleared by orthopedics.  Atrial fibrillation (HCC) -- Paced rhythm on EKG.  Stable, continue amiodarone.  Type 2 diabetes mellitus without complication (HCC) -- Stable.  Metformin on hold during hospitalization. -- Sliding scale insulin.  DVT, lower extremity, recurrent (HCC) -- Apixaban on hold, heparin bridge for now  Diastolic heart failure (HCC) -- Appears euvolemic.  Continue oral Lasix. -- Last echocardiogram August 2021 with LVEF 60 to 65%.  Grade 1 diastolic dysfunction.  Emphysema lung (HCC) --stable. Bronchodilators as needed. Not on oxygen at home.  Aortic atherosclerosis ([place]) --continue statin  Complete heart block ([place]) -- Status post pacemaker insertion 2013.  EKG shows paced rhythm.  Disposition Plan:  Discussion:   Status is: Inpatient  Remains inpatient appropriate because:Inpatient level of care appropriate due to severity of illness  Dispo: The patient is from: Home               Anticipated d/c is to: SNF              Patient currently is not medically stable to d/c.   Difficult to place patient No  DVT prophylaxis: heparin gtt     Code Status: Full Code Level of care: Telemetry Medical Family Communication: none  Murray Hodgkins, MD  [facility] Direct contact: see [email or web address] (further directions at bottom of note if needed) 7PM-7AM contact night coverage as at bottom of note 08/08/2020, 5:08 PM  LOS: 2 days   Significant Hospital Events   7/30 admit for hip fx   Consults:  Orthopedics Cardiology    Procedures:    Interval History/Subjective  CC: f/u hip fx  Pain ok Breathing ok  Objective   Vitals:  Vitals:   08/07/20 2143 08/08/20 0732  BP: (!) 130/95 (!) 116/59  Pulse: 71 75  Resp: 16   Temp: 98.5 F (36.9 C) 98.6 F (37 C)  SpO2: 93% 90%    Exam: Physical Exam Vitals reviewed.  Constitutional:      Appearance: Normal appearance.  Cardiovascular:     Rate and Rhythm: Normal rate and regular rhythm.     Heart sounds: No murmur heard. Pulmonary:     Effort: Pulmonary effort is normal. No respiratory distress.     Breath sounds: Normal breath sounds. No wheezing or rales.  Musculoskeletal:     Right lower leg: Edema (mild) present.     Left lower leg: No edema.  Neurological:     Mental Status: He is alert.  Psychiatric:        Mood and Affect: Mood normal.  Behavior: Behavior normal.    I have personally reviewed the labs and other data, making special note of:   Today's Data  CBG stable Hgb minimal change  Scheduled Meds:  amiodarone  200 mg Oral Daily   [START ON 08/09/2020] atorvastatin  20 mg Oral Daily   feeding supplement (GLUCERNA SHAKE)  237 mL Oral TID BM   insulin aspart  0-9 Units Subcutaneous TID WC   multivitamin with minerals  1 tablet Oral Daily   nicotine  21 mg Transdermal Daily   predniSONE  10 mg Oral Q breakfast   tamsulosin  0.4 mg Oral Daily   torsemide  40 mg Oral QODAY    triamcinolone cream  1 application Topical BID   Continuous Infusions:  heparin 1,300 Units/hr (08/08/20 0416)    Principal Problem:   Closed right hip fracture (HCC) Active Problems:   Atrial fibrillation (HCC)   CAD (coronary artery disease),Prior IMI    Diastolic heart failure (HCC)   DVT, lower extremity, recurrent (HCC)   Type 2 diabetes mellitus without complication (HCC)   Complete heart block (HCC)   Tobacco abuse   Rash   Leukocytosis   Aortic atherosclerosis ([place])   Emphysema lung ([place])   LOS: 2 days   How to contact the [facility] Attending or Consulting provider 7A - 7P or covering provider during after hours [place], for this patient?  Check the care team in [facility] and look for a) attending/consulting TRH provider listed and b) the [facility] team listed Log into [email or web address] and use [place]'s universal password to access. If you do not have the password, please contact the hospital operator. Locate the [facility] provider you are looking for under [facility] and page to a number that you can be directly reached. If you still have difficulty reaching the provider, please page the [facility] (Director on Call) for the Hospitalists listed on amion for assistance.

## 2020-08-08 NOTE — Progress Notes (Signed)
Patient ID: Lance Smith, male   DOB: July 09, 1931, 87 y.o.   MRN: [id number]  Will plan on THA Sunday by Dr. Lyla Glassing. NPO after MN Saturday and please hold heparin 6h prior to surgery.    Lisette Abu, PA-C Orthopedic Surgery [phone]

## 2020-08-08 NOTE — Progress Notes (Signed)
Pt got up out of bed, and took himself to the bathroom ---found pt sitting on the toilet in the bathroom. Pt disconnected his IV line, took off his telemetry box, and managed to walk into the bathroom with his right hip fracture.  Pt stated he had to go and couldn't wait.  Pt states he had a small bm (otherwise pt has been using the urinal while in the bed.)   Two staff members assisted pt with use of a walker back to the bed. All lines/monitors replaced and pt instructed not to get OOB without assist. Resecured bed alarms on. Pt verbalized understanding not get up without assist, to call again if no one there to assist with bathroom or other needs within few minutes.

## 2020-08-09 DIAGNOSIS — S72001A Fracture of unspecified part of neck of right femur, initial encounter for closed fracture: Secondary | ICD-10-CM | POA: Diagnosis not present

## 2020-08-09 LAB — GLUCOSE, CAPILLARY
Glucose-Capillary: 125 mg/dL — ABNORMAL HIGH (ref 70–99)
Glucose-Capillary: 105 mg/dL — ABNORMAL HIGH (ref 70–99)
Glucose-Capillary: 200 mg/dL — ABNORMAL HIGH (ref 70–99)
Glucose-Capillary: 135 mg/dL — ABNORMAL HIGH (ref 70–99)

## 2020-08-09 LAB — CBC
HCT: 39 % (ref 39.0–52.0)
Hemoglobin: 12.7 g/dL — ABNORMAL LOW (ref 13.0–17.0)
MCH: 34.3 pg — ABNORMAL HIGH (ref 26.0–34.0)
MCHC: 32.6 g/dL (ref 30.0–36.0)
MCV: 105.4 fL — ABNORMAL HIGH (ref 80.0–100.0)
Platelets: 264 10*3/uL (ref 150–400)
RBC: 3.7 MIL/uL — ABNORMAL LOW (ref 4.22–5.81)
RDW: 13.1 % (ref 11.5–15.5)
WBC: 12.6 10*3/uL — ABNORMAL HIGH (ref 4.0–10.5)
nRBC: 0 % (ref 0.0–0.2)

## 2020-08-09 LAB — APTT
aPTT: 121 seconds — ABNORMAL HIGH (ref 24–36)
aPTT: 54 seconds — ABNORMAL HIGH (ref 24–36)

## 2020-08-09 LAB — HEPARIN LEVEL (UNFRACTIONATED)
Heparin Unfractionated: 0.65 IU/mL (ref 0.30–0.70)
Heparin Unfractionated: 0.3 IU/mL (ref 0.30–0.70)

## 2020-08-09 NOTE — Progress Notes (Signed)
Decreased heparin to 1150 units/hr, 11.5 L/min on pump per pharmacist, Zacarias Pontes.

## 2020-08-09 NOTE — Progress Notes (Signed)
[place] for Heparin Indication: atrial fibrillation and hx of DVT  Assessment: Patient presents with hip pain s/p fall and MD is planning for hip surgery.  He is on chronic Xarelto for Afib and recurrent DVTs.  Pharmacy consulted to dose IV heparin.  aPTT 121 sec, heparin level 0.65 units/ml  Goal of Therapy:  Heparin level 0.3-0.7 units/ml aPTT 66-102 seconds Monitor platelets by anticoagulation protocol: Yes   Plan:  Decrease heparin gtt to 1150 units/hr Check 8 hr aPTT and heparin level Daily aPTT, heparin level and CBC  Excell Seltzer, PharmD 08/09/2020, 6:19 AM

## 2020-08-09 NOTE — Progress Notes (Signed)
Pt IV infiltrated on the right arm. IV stopped, cold compress applied to the IV site.

## 2020-08-09 NOTE — Progress Notes (Signed)
[place] for Heparin Indication: atrial fibrillation and hx of DVT  Patient Measurements: Height: '5\' 9"'$  (175.3 cm) Weight: 75.7 kg (166 lb 14.2 oz) IBW/kg (Calculated) : 70.7 Heparin Dosing Weight: 75.7 kg  Assessment: Patient presents with hip pain s/p fall and MD is planning for hip surgery.  He is on chronic Xarelto for Afib and recurrent DVTs.  Pharmacy consulted to dose IV heparin.  aPTT this evening is SUBtherapeutic (aPTT 54, goal of 66-102). Heparin level still falsely elevated given recent Apixaban use but closer to being correlated.   Of noting, rate change to 1150 units/hr not charted on [facility] but RN note indicating change and per discussion with RN this evening - at appropriate rate. No bleeding noted.   Plans are for Heparin to stop at 0200 on 7/24 for planned THA.  Goal of Therapy:  Heparin level 0.3-0.7 units/ml aPTT 66-102 seconds Monitor platelets by anticoagulation protocol: Yes   Plan:  - Increase Heparin back to 1250 units/hr - Off on 7/24 @ 0200 for planned surgery - stop time on [place] and discussed with RN - Will not check additional levels prior to stop - f/u on anticoag plans post-procedure  Thank you for allowing pharmacy to be a part of this patient's care.  Alycia Rossetti, PharmD, BCPS Clinical Pharmacist Clinical phone for 08/09/2020: [phone] 08/09/2020 4:41 PM   **Pharmacist phone directory can now be found on [email or web address] ([email or web address]).  Listed under [place].

## 2020-08-09 NOTE — Plan of Care (Signed)
  Problem: Activity: Goal: Risk for activity intolerance will decrease Outcome: Progressing   Problem: Coping: Goal: Level of anxiety will decrease Outcome: Progressing   Problem: Pain Managment: Goal: General experience of comfort will improve Outcome: Progressing   Problem: Safety: Goal: Ability to remain free from injury will improve Outcome: Progressing   Problem: Skin Integrity: Goal: Risk for impaired skin integrity will decrease Outcome: Progressing   

## 2020-08-09 NOTE — Progress Notes (Signed)
Lance Smith [id number] DOB: 12-07-1931 DOA: 08/06/2020 PCP: Burnard Bunting, MD  Brief History   87 year old man PMH including atrial fibrillation, complete heart block status post pacemaker implant 2013, CAD, prior MI, diastolic CHF, recurrent DVT, on apixaban, presented with hip pain after fall.  Admitted for closed right hip fracture.  Operative intervention delayed by anticoagulation.  Plan for hip replacement 7/24.  A & P   Closed right hip fracture (HCC) -- Status post mechanical fall at home. -- Orthopedics plans on definitive management 7/24  Apixaban on hold. -- Heparin drip perioperatively given history of recurrent DVTs. -- Hip fracture protocol for pain management -currently well controlled  Emphysema lung ([place]) --stable. Bronchodilators as needed. Not on oxygen at home.  Aortic atherosclerosis (HCC) --continue statin  Type 2 diabetes mellitus without complication (HCC) -- Stable.  Metformin on hold during hospitalization. -- Sliding scale insulin/glycemic protocol ongoing.  DVT, lower extremity, recurrent (HCC) -- Apixaban on hold, heparin bridge for now  CAD (coronary artery disease),Prior IMI  -- Cardiology following, noted to be at high risk for surgery but cleared without further intervention or evaluation. -- Plavix on hold for surgery. -- Continue statin. -- Resume Plavix/anticoagulation when cleared by orthopedics.  Atrial fibrillation (HCC) -- Paced rhythm on EKG.  Stable, continue amiodarone.  Diastolic heart failure (HCC) -- Appears euvolemic.  Continue oral Lasix. -- Last echocardiogram August 2021 with LVEF 60 to 65%.  Grade 1 diastolic dysfunction.  Complete heart block ([place]) -- Status post pacemaker insertion 2013.  EKG shows paced rhythm.   Disposition Plan:  Discussion:   Status is: Inpatient  Remains inpatient appropriate because:Inpatient level of care appropriate due to severity of illness  Dispo: The patient is  from: Home              Anticipated d/c is to: SNF              Patient currently is not medically stable to d/c.   Difficult to place patient No  DVT prophylaxis: heparin gtt    Code Status: Full Code Level of care: Telemetry Medical Family Communication: none  Holli Humbles DO  [facility] Direct contact: Secure chat 7PM-7AM contact night coverage as at bottom of note 08/09/2020, 8:25 AM  LOS: 3 days   Significant Hospital Events   7/30 admit for hip fx   Consults:  Orthopedics Cardiology    Procedures:  Tentatively planned ORIF 08/10/2020  Interval History/Subjective  CC: f/u hip fx  No acute issues or events overnight denies nausea vomiting diarrhea constipation headache fevers chills chest pain shortness of breath.  Objective   Vitals:  Vitals:   08/08/20 2119 08/09/20 0700  BP: 113/84 (!) 129/57  Pulse: 67 73  Resp: 16 18  Temp: 98.6 F (37 C) 98.7 F (37.1 C)  SpO2: 92% 94%    Exam: Physical Exam Vitals reviewed.  Constitutional:      Appearance: Normal appearance.  Cardiovascular:     Rate and Rhythm: Normal rate and regular rhythm.     Heart sounds: No murmur heard. Pulmonary:     Effort: Pulmonary effort is normal. No respiratory distress.     Breath sounds: Normal breath sounds. No wheezing or rales.  Musculoskeletal:     Right lower leg: Edema (mild) present.     Left lower leg: No edema.  Neurological:     Mental Status: He is alert.  Psychiatric:        Mood and Affect:  Mood normal.        Behavior: Behavior normal.    I have personally reviewed the labs and other data, making special note of:   Today's Data  CBG stable Hgb minimal change  Scheduled Meds:  amiodarone  200 mg Oral Daily   atorvastatin  20 mg Oral Daily   feeding supplement (GLUCERNA SHAKE)  237 mL Oral TID BM   insulin aspart  0-9 Units Subcutaneous TID WC   multivitamin with minerals  1 tablet Oral Daily   nicotine  21 mg Transdermal Daily    predniSONE  10 mg Oral Q breakfast   tamsulosin  0.4 mg Oral Daily   torsemide  40 mg Oral QODAY   triamcinolone cream  1 application Topical BID   Continuous Infusions:  heparin 1,250 Units/hr (08/09/20 0217)    Principal Problem:   Closed right hip fracture (HCC) Active Problems:   Complete heart block (HCC)   Tobacco abuse   Diastolic heart failure (HCC)   Atrial fibrillation (HCC)   CAD (coronary artery disease),Prior IMI    DVT, lower extremity, recurrent (HCC)   Rash   Leukocytosis   Type 2 diabetes mellitus without complication ([place])   Aortic atherosclerosis ([place])   Emphysema lung ([place])   LOS: 3 days   How to contact the [facility] Attending or Consulting provider 7A - 7P or covering provider during after hours [place], for this patient?  Check the care team in [facility] and look for a) attending/consulting TRH provider listed and b) the [facility] team listed Log into [email or web address] and use [place]'s universal password to access. If you do not have the password, please contact the hospital operator. Locate the [facility] provider you are looking for under [facility] and page to a number that you can be directly reached. If you still have difficulty reaching the provider, please page the [facility] (Director on Call) for the Hospitalists listed on amion for assistance.

## 2020-08-10 ENCOUNTER — Inpatient Hospital Stay (HOSPITAL_COMMUNITY): Payer: Medicare Other

## 2020-08-10 ENCOUNTER — Encounter (HOSPITAL_COMMUNITY)
Admission: EM | Disposition: A | Discharge status: Home Health | Payer: Self-pay | Source: Home / Self Care | Attending: Family Medicine

## 2020-08-10 ENCOUNTER — Encounter (HOSPITAL_COMMUNITY): Payer: Self-pay | Admitting: Family Medicine

## 2020-08-10 ENCOUNTER — Inpatient Hospital Stay (HOSPITAL_COMMUNITY): Payer: Medicare Other | Admitting: Certified Registered Nurse Anesthetist

## 2020-08-10 DIAGNOSIS — I4891 Unspecified atrial fibrillation: Secondary | ICD-10-CM | POA: Diagnosis not present

## 2020-08-10 DIAGNOSIS — S72001A Fracture of unspecified part of neck of right femur, initial encounter for closed fracture: Secondary | ICD-10-CM | POA: Diagnosis not present

## 2020-08-10 DIAGNOSIS — I2510 Atherosclerotic heart disease of native coronary artery without angina pectoris: Secondary | ICD-10-CM

## 2020-08-10 DIAGNOSIS — I82409 Acute embolism and thrombosis of unspecified deep veins of unspecified lower extremity: Secondary | ICD-10-CM | POA: Diagnosis not present

## 2020-08-10 HISTORY — PX: TOTAL HIP ARTHROPLASTY: SHX124

## 2020-08-10 LAB — GLUCOSE, CAPILLARY
Glucose-Capillary: 109 mg/dL — ABNORMAL HIGH (ref 70–99)
Glucose-Capillary: 150 mg/dL — ABNORMAL HIGH (ref 70–99)
Glucose-Capillary: 171 mg/dL — ABNORMAL HIGH (ref 70–99)
Glucose-Capillary: 190 mg/dL — ABNORMAL HIGH (ref 70–99)
Glucose-Capillary: 277 mg/dL — ABNORMAL HIGH (ref 70–99)

## 2020-08-10 LAB — CBC
HCT: 39.2 % (ref 39.0–52.0)
Hemoglobin: 12.7 g/dL — ABNORMAL LOW (ref 13.0–17.0)
MCH: 34.2 pg — ABNORMAL HIGH (ref 26.0–34.0)
MCHC: 32.4 g/dL (ref 30.0–36.0)
MCV: 105.7 fL — ABNORMAL HIGH (ref 80.0–100.0)
Platelets: 253 10*3/uL (ref 150–400)
RBC: 3.71 MIL/uL — ABNORMAL LOW (ref 4.22–5.81)
RDW: 12.8 % (ref 11.5–15.5)
WBC: 11.5 10*3/uL — ABNORMAL HIGH (ref 4.0–10.5)
nRBC: 0 % (ref 0.0–0.2)

## 2020-08-10 SURGERY — ARTHROPLASTY, HIP, TOTAL, ANTERIOR APPROACH
Anesthesia: General | Site: Hip | Laterality: Right

## 2020-08-10 MED ORDER — SENNA 8.6 MG PO TABS
1.0000 | ORAL_TABLET | Freq: Two times a day (BID) | ORAL | Status: DC
  Administered 2020-08-10 – 2020-08-16 (×12): 8.6 mg via ORAL
  Filled 2020-08-10 (×12): qty 1

## 2020-08-10 MED ORDER — CHLORHEXIDINE GLUCONATE 4 % EX LIQD
60.0000 mL | Freq: Once | CUTANEOUS | Status: AC
  Administered 2020-08-10: 4 via TOPICAL
  Filled 2020-08-10: qty 60

## 2020-08-10 MED ORDER — HYDROMORPHONE HCL 1 MG/ML IJ SOLN
INTRAMUSCULAR | Status: DC | PRN
  Administered 2020-08-10: .5 mg via INTRAVENOUS

## 2020-08-10 MED ORDER — FENTANYL CITRATE (PF) 250 MCG/5ML IJ SOLN
INTRAMUSCULAR | Status: AC
  Filled 2020-08-10: qty 5

## 2020-08-10 MED ORDER — ACETAMINOPHEN 10 MG/ML IV SOLN
INTRAVENOUS | Status: DC | PRN
  Administered 2020-08-10: 1000 mg via INTRAVENOUS

## 2020-08-10 MED ORDER — PROPOFOL 10 MG/ML IV BOLUS
INTRAVENOUS | Status: DC | PRN
  Administered 2020-08-10: 100 mg via INTRAVENOUS

## 2020-08-10 MED ORDER — LIDOCAINE 2% (20 MG/ML) 5 ML SYRINGE
INTRAMUSCULAR | Status: DC | PRN
  Administered 2020-08-10: 60 mg via INTRAVENOUS

## 2020-08-10 MED ORDER — ALBUMIN HUMAN 5 % IV SOLN: INTRAVENOUS | Status: DC | PRN

## 2020-08-10 MED ORDER — MENTHOL 3 MG MT LOZG: 1.0000 | LOZENGE | OROMUCOSAL | Status: DC | PRN

## 2020-08-10 MED ORDER — ONDANSETRON HCL 4 MG/2ML IJ SOLN: 4.0000 mg | Freq: Once | INTRAMUSCULAR | Status: DC | PRN

## 2020-08-10 MED ORDER — DEXAMETHASONE SODIUM PHOSPHATE 10 MG/ML IJ SOLN
INTRAMUSCULAR | Status: AC
  Filled 2020-08-10: qty 2

## 2020-08-10 MED ORDER — ACETAMINOPHEN 10 MG/ML IV SOLN: 1000.0000 mg | Freq: Once | INTRAVENOUS | Status: DC | PRN

## 2020-08-10 MED ORDER — POVIDONE-IODINE 10 % EX SWAB: 2.0000 "application " | Freq: Once | CUTANEOUS | Status: DC

## 2020-08-10 MED ORDER — PHENYLEPHRINE HCL-NACL 10-0.9 MG/250ML-% IV SOLN
INTRAVENOUS | Status: DC | PRN
  Administered 2020-08-10: 50 ug/min via INTRAVENOUS

## 2020-08-10 MED ORDER — CHLORHEXIDINE GLUCONATE 0.12 % MT SOLN
OROMUCOSAL | Status: AC
  Administered 2020-08-10: 15 mL via OROMUCOSAL
  Filled 2020-08-10: qty 15

## 2020-08-10 MED ORDER — POLYETHYLENE GLYCOL 3350 17 G PO PACK
17.0000 g | PACK | Freq: Two times a day (BID) | ORAL | Status: DC
  Administered 2020-08-10 – 2020-08-16 (×12): 17 g via ORAL
  Filled 2020-08-10 (×12): qty 1

## 2020-08-10 MED ORDER — ORAL CARE MOUTH RINSE: 15.0000 mL | Freq: Once | OROMUCOSAL | Status: AC

## 2020-08-10 MED ORDER — ROCURONIUM BROMIDE 10 MG/ML (PF) SYRINGE
PREFILLED_SYRINGE | INTRAVENOUS | Status: AC
  Filled 2020-08-10: qty 20

## 2020-08-10 MED ORDER — AMISULPRIDE (ANTIEMETIC) 5 MG/2ML IV SOLN: 10.0000 mg | Freq: Once | INTRAVENOUS | Status: DC | PRN

## 2020-08-10 MED ORDER — SENNA 8.6 MG PO TABS: 1.0000 | ORAL_TABLET | Freq: Every day | ORAL | Status: DC

## 2020-08-10 MED ORDER — KETOROLAC TROMETHAMINE 30 MG/ML IJ SOLN
INTRAMUSCULAR | Status: DC | PRN
  Administered 2020-08-10: 30 mg via INTRAMUSCULAR

## 2020-08-10 MED ORDER — VANCOMYCIN HCL IN DEXTROSE 1-5 GM/200ML-% IV SOLN
1000.0000 mg | Freq: Two times a day (BID) | INTRAVENOUS | Status: AC
Start: 2020-08-10 — End: 2020-08-10
  Administered 2020-08-10: 1000 mg via INTRAVENOUS
  Filled 2020-08-10: qty 200

## 2020-08-10 MED ORDER — METOCLOPRAMIDE HCL 5 MG/ML IJ SOLN: 5.0000 mg | Freq: Three times a day (TID) | INTRAMUSCULAR | Status: DC | PRN

## 2020-08-10 MED ORDER — ROCURONIUM BROMIDE 10 MG/ML (PF) SYRINGE
PREFILLED_SYRINGE | INTRAVENOUS | Status: DC | PRN
  Administered 2020-08-10: 50 mg via INTRAVENOUS

## 2020-08-10 MED ORDER — 0.9 % SODIUM CHLORIDE (POUR BTL) OPTIME
TOPICAL | Status: DC | PRN
  Administered 2020-08-10: 1000 mL

## 2020-08-10 MED ORDER — ONDANSETRON HCL 4 MG/2ML IJ SOLN
INTRAMUSCULAR | Status: DC | PRN
  Administered 2020-08-10: 4 mg via INTRAVENOUS

## 2020-08-10 MED ORDER — BUPIVACAINE-EPINEPHRINE (PF) 0.5% -1:200000 IJ SOLN
INTRAMUSCULAR | Status: DC | PRN
  Administered 2020-08-10: 30 mL via PERINEURAL

## 2020-08-10 MED ORDER — BUPIVACAINE-EPINEPHRINE 0.5% -1:200000 IJ SOLN
INTRAMUSCULAR | Status: AC
  Filled 2020-08-10: qty 1

## 2020-08-10 MED ORDER — PHENOL 1.4 % MT LIQD: 1.0000 | OROMUCOSAL | Status: DC | PRN

## 2020-08-10 MED ORDER — TRANEXAMIC ACID-NACL 1000-0.7 MG/100ML-% IV SOLN
1000.0000 mg | Freq: Once | INTRAVENOUS | Status: AC
  Administered 2020-08-10: 1000 mg via INTRAVENOUS
  Filled 2020-08-10: qty 100

## 2020-08-10 MED ORDER — HYDROMORPHONE HCL 1 MG/ML IJ SOLN
INTRAMUSCULAR | Status: AC
  Filled 2020-08-10: qty 0.5

## 2020-08-10 MED ORDER — RIVAROXABAN 10 MG PO TABS
10.0000 mg | ORAL_TABLET | Freq: Every day | ORAL | Status: DC
  Administered 2020-08-11 – 2020-08-13 (×3): 10 mg via ORAL
  Filled 2020-08-10 (×3): qty 1

## 2020-08-10 MED ORDER — TRANEXAMIC ACID-NACL 1000-0.7 MG/100ML-% IV SOLN
1000.0000 mg | INTRAVENOUS | Status: AC
  Administered 2020-08-10: 1000 mg via INTRAVENOUS
  Filled 2020-08-10: qty 100

## 2020-08-10 MED ORDER — FENTANYL CITRATE (PF) 250 MCG/5ML IJ SOLN
INTRAMUSCULAR | Status: DC | PRN
  Administered 2020-08-10: 150 ug via INTRAVENOUS
  Administered 2020-08-10: 50 ug via INTRAVENOUS

## 2020-08-10 MED ORDER — DOCUSATE SODIUM 100 MG PO CAPS
100.0000 mg | ORAL_CAPSULE | Freq: Two times a day (BID) | ORAL | Status: DC
  Administered 2020-08-10 – 2020-08-16 (×13): 100 mg via ORAL
  Filled 2020-08-10 (×12): qty 1

## 2020-08-10 MED ORDER — ONDANSETRON HCL 4 MG PO TABS: 4.0000 mg | ORAL_TABLET | Freq: Four times a day (QID) | ORAL | Status: DC | PRN

## 2020-08-10 MED ORDER — CHLORHEXIDINE GLUCONATE 0.12 % MT SOLN: 15.0000 mL | Freq: Once | OROMUCOSAL | Status: AC

## 2020-08-10 MED ORDER — METOCLOPRAMIDE HCL 5 MG PO TABS: 5.0000 mg | ORAL_TABLET | Freq: Three times a day (TID) | ORAL | Status: DC | PRN

## 2020-08-10 MED ORDER — ACETAMINOPHEN 10 MG/ML IV SOLN
INTRAVENOUS | Status: AC
  Filled 2020-08-10: qty 100

## 2020-08-10 MED ORDER — ONDANSETRON HCL 4 MG/2ML IJ SOLN
4.0000 mg | Freq: Four times a day (QID) | INTRAMUSCULAR | Status: DC | PRN
  Administered 2020-08-15: 4 mg via INTRAVENOUS
  Filled 2020-08-10: qty 2

## 2020-08-10 MED ORDER — SODIUM CHLORIDE 0.9 % IR SOLN
Status: DC | PRN
  Administered 2020-08-10: 3000 mL

## 2020-08-10 MED ORDER — LACTATED RINGERS IV SOLN: INTRAVENOUS | Status: DC

## 2020-08-10 MED ORDER — PHENYLEPHRINE 40 MCG/ML (10ML) SYRINGE FOR IV PUSH (FOR BLOOD PRESSURE SUPPORT)
PREFILLED_SYRINGE | INTRAVENOUS | Status: AC
  Filled 2020-08-10: qty 20

## 2020-08-10 MED ORDER — DEXAMETHASONE SODIUM PHOSPHATE 10 MG/ML IJ SOLN
INTRAMUSCULAR | Status: DC | PRN
  Administered 2020-08-10: 10 mg via INTRAVENOUS

## 2020-08-10 MED ORDER — SODIUM CHLORIDE (PF) 0.9 % IJ SOLN
INTRAMUSCULAR | Status: DC | PRN
  Administered 2020-08-10: 30 mL via INTRAVENOUS

## 2020-08-10 MED ORDER — LIDOCAINE 2% (20 MG/ML) 5 ML SYRINGE
INTRAMUSCULAR | Status: AC
  Filled 2020-08-10: qty 10

## 2020-08-10 MED ORDER — SUGAMMADEX SODIUM 200 MG/2ML IV SOLN
INTRAVENOUS | Status: DC | PRN
  Administered 2020-08-10: 200 mg via INTRAVENOUS

## 2020-08-10 MED ORDER — KETOROLAC TROMETHAMINE 30 MG/ML IJ SOLN
INTRAMUSCULAR | Status: AC
  Filled 2020-08-10: qty 1

## 2020-08-10 MED ORDER — VANCOMYCIN HCL IN DEXTROSE 1-5 GM/200ML-% IV SOLN
1000.0000 mg | INTRAVENOUS | Status: AC
  Administered 2020-08-10: 1000 mg via INTRAVENOUS
  Filled 2020-08-10: qty 200

## 2020-08-10 MED ORDER — FENTANYL CITRATE (PF) 100 MCG/2ML IJ SOLN: 25.0000 ug | INTRAMUSCULAR | Status: DC | PRN

## 2020-08-10 MED ORDER — PROPOFOL 10 MG/ML IV BOLUS
INTRAVENOUS | Status: AC
  Filled 2020-08-10: qty 20

## 2020-08-10 MED ORDER — ONDANSETRON HCL 4 MG/2ML IJ SOLN
INTRAMUSCULAR | Status: AC
  Filled 2020-08-10: qty 4

## 2020-08-10 SURGICAL SUPPLY — 68 items
ADH SKN CLS APL DERMABOND .7 (GAUZE/BANDAGES/DRESSINGS) ×1
ALCOHOL 70% 16 OZ (MISCELLANEOUS) ×2 IMPLANT
APL PRP STRL LF DISP 70% ISPRP (MISCELLANEOUS) ×1
BAG COUNTER SPONGE SURGICOUNT (BAG) ×2 IMPLANT
BAG SPNG CNTER NS LX DISP (BAG) ×1
BALL HIP ARTICU EZE 36 8.5 (Hips) IMPLANT
BLADE CLIPPER SURG (BLADE) IMPLANT
CHLORAPREP W/TINT 26 (MISCELLANEOUS) ×2 IMPLANT
COVER SURGICAL LIGHT HANDLE (MISCELLANEOUS) ×2 IMPLANT
DERMABOND ADVANCED (GAUZE/BANDAGES/DRESSINGS) ×1
DERMABOND ADVANCED .7 DNX12 (GAUZE/BANDAGES/DRESSINGS) ×2 IMPLANT
DRAPE C-ARM 42X72 X-RAY (DRAPES) ×2 IMPLANT
DRAPE STERI IOBAN 125X83 (DRAPES) ×2 IMPLANT
DRAPE U-SHAPE 47X51 STRL (DRAPES) ×6 IMPLANT
DRSG AQUACEL AG ADV 3.5X10 (GAUZE/BANDAGES/DRESSINGS) ×2 IMPLANT
ELECT BLADE 4.0 EZ CLEAN MEGAD (MISCELLANEOUS) ×2
ELECT PENCIL ROCKER SW 15FT (MISCELLANEOUS) ×2 IMPLANT
ELECT REM PT RETURN 9FT ADLT (ELECTROSURGICAL) ×2
ELECTRODE BLDE 4.0 EZ CLN MEGD (MISCELLANEOUS) ×1 IMPLANT
ELECTRODE REM PT RTRN 9FT ADLT (ELECTROSURGICAL) ×1 IMPLANT
EVACUATOR 1/8 PVC DRAIN (DRAIN) IMPLANT
GLOVE SURG ENC MOIS LTX SZ8.5 (GLOVE) ×4 IMPLANT
GLOVE SURG ENC TEXT LTX SZ7 (GLOVE) ×2 IMPLANT
GLOVE SURG UNDER POLY LF SZ7.5 (GLOVE) ×2 IMPLANT
GLOVE SURG UNDER POLY LF SZ8.5 (GLOVE) ×2 IMPLANT
GOWN STRL REUS W/ TWL LRG LVL3 (GOWN DISPOSABLE) ×2 IMPLANT
GOWN STRL REUS W/ TWL XL LVL3 (GOWN DISPOSABLE) ×1 IMPLANT
GOWN STRL REUS W/TWL 2XL LVL3 (GOWN DISPOSABLE) ×2 IMPLANT
GOWN STRL REUS W/TWL LRG LVL3 (GOWN DISPOSABLE) ×4
GOWN STRL REUS W/TWL XL LVL3 (GOWN DISPOSABLE) ×2
HANDPIECE INTERPULSE COAX TIP (DISPOSABLE) ×2
HIP BALL ARTICU EZE 36 8.5 (Hips) ×2 IMPLANT
HOOD PEEL AWAY FACE SHEILD DIS (HOOD) ×4 IMPLANT
JET LAVAGE IRRISEPT WOUND (IRRIGATION / IRRIGATOR) ×2
KIT BASIN OR (CUSTOM PROCEDURE TRAY) ×2 IMPLANT
KIT TURNOVER KIT B (KITS) ×2 IMPLANT
LAVAGE JET IRRISEPT WOUND (IRRIGATION / IRRIGATOR) ×1 IMPLANT
LINER NEUTRAL 52X36MM PLUS 4 (Liner) ×1 IMPLANT
MANIFOLD NEPTUNE II (INSTRUMENTS) ×2 IMPLANT
MARKER SKIN DUAL TIP RULER LAB (MISCELLANEOUS) ×4 IMPLANT
NDL SPNL 18GX3.5 QUINCKE PK (NEEDLE) ×1 IMPLANT
NEEDLE SPNL 18GX3.5 QUINCKE PK (NEEDLE) ×2 IMPLANT
NS IRRIG 1000ML POUR BTL (IV SOLUTION) ×2 IMPLANT
PACK TOTAL JOINT (CUSTOM PROCEDURE TRAY) ×2 IMPLANT
PACK UNIVERSAL I (CUSTOM PROCEDURE TRAY) ×2 IMPLANT
PAD ARMBOARD 7.5X6 YLW CONV (MISCELLANEOUS) ×4 IMPLANT
PIN SECTOR W/GRIP ACE CUP 52MM (Hips) ×1 IMPLANT
SAW OSC TIP CART 19.5X105X1.3 (SAW) ×2 IMPLANT
SEALER BIPOLAR AQUA 6.0 (INSTRUMENTS) IMPLANT
SET HNDPC FAN SPRY TIP SCT (DISPOSABLE) ×1 IMPLANT
SOL PREP POV-IOD 4OZ 10% (MISCELLANEOUS) ×2 IMPLANT
STEM TRI LOC BPS SZ6 W GRIPTON IMPLANT
SUT ETHIBOND NAB CT1 #1 30IN (SUTURE) ×4 IMPLANT
SUT MNCRL AB 3-0 PS2 18 (SUTURE) ×2 IMPLANT
SUT MON AB 2-0 CT1 36 (SUTURE) ×2 IMPLANT
SUT VIC AB 1 CT1 27 (SUTURE) ×2
SUT VIC AB 1 CT1 27XBRD ANBCTR (SUTURE) ×1 IMPLANT
SUT VIC AB 2-0 CT1 27 (SUTURE) ×2
SUT VIC AB 2-0 CT1 TAPERPNT 27 (SUTURE) ×1 IMPLANT
SUT VLOC 180 0 24IN GS25 (SUTURE) ×2 IMPLANT
SYR 50ML LL SCALE MARK (SYRINGE) ×2 IMPLANT
TOWEL GREEN STERILE (TOWEL DISPOSABLE) ×2 IMPLANT
TOWEL GREEN STERILE FF (TOWEL DISPOSABLE) ×2 IMPLANT
TRAY CATH 16FR W/PLASTIC CATH (SET/KITS/TRAYS/PACK) IMPLANT
TRAY FOLEY W/BAG SLVR 16FR (SET/KITS/TRAYS/PACK)
TRAY FOLEY W/BAG SLVR 16FR ST (SET/KITS/TRAYS/PACK) IMPLANT
TRI LOC BPS SZ 6 W GRIPTON ×2 IMPLANT
WATER STERILE IRR 1000ML POUR (IV SOLUTION) ×6 IMPLANT

## 2020-08-10 NOTE — Progress Notes (Signed)
Heparin stopped at 0200 per order , pt has surgery scheduled this AM. RN will continue to monitor.

## 2020-08-10 NOTE — Op Note (Signed)
OPERATIVE REPORT  SURGEON: Rod Can, MD   ASSISTANT: Jonelle Sidle, PA-C  PREOPERATIVE DIAGNOSIS: Displaced Right femoral neck fracture.   POSTOPERATIVE DIAGNOSIS: Displaced Right femoral neck fracture.   PROCEDURE: Right total hip arthroplasty, anterior approach.   IMPLANTS: DePuy Tri Lock stem, size 6, hi offset. DePuy Pinnacle Cup, size 52 mm. DePuy Altrx liner, size 36 by 52 mm, +4 neutral. DePuy metal head ball, size 36 + 8.5 mm.  ANESTHESIA:  General  ANTIBIOTICS: 1g vancomycin.  ESTIMATED BLOOD LOSS:-250 mL    DRAINS: None.  COMPLICATIONS: None   CONDITION: PACU - hemodynamically stable.   BRIEF CLINICAL NOTE: Lance Smith is a 87 y.o. male with a displaced Right femoral neck fracture. The patient was admitted to the hospitalist service and underwent perioperative risk stratification and medical optimization. The risks, benefits, and alternatives to total hip arthroplasty were explained, and the patient elected to proceed.  PROCEDURE IN DETAIL: The patient was taken to the operating room and general anesthesia was induced on the hospital bed.  The patient was then positioned on the Hana table.  All bony prominences were well padded.  The hip was prepped and draped in the normal sterile surgical fashion.  A time-out was called verifying side and site of surgery. Antibiotics were given within 60 minutes of beginning the procedure.  The direct anterior approach to the hip was performed through the Hueter interval.  Lateral femoral circumflex vessels were treated with the Auqumantys. The anterior capsule was exposed and an inverted T capsulotomy was made.  Fracture hematoma was encountered and evacuated. The patient was found to have a comminuted Right subcapital femoral neck fracture.  I freshened the femoral neck cut with a saw.  I removed the femoral neck fragment.  A corkscrew was placed into the head and the head was removed.  This was passed to the back table  and was measured.   Acetabular exposure was achieved, and the pulvinar and labrum were excised. Sequential reaming of the acetabulum was then performed up to a size 51 mm reamer. A 52 mm cup was then opened and impacted into place at approximately 40 degrees of abduction and 20 degrees of anteversion. The final polyethylene liner was impacted into place.    I then gained femoral exposure taking care to protect the abductors and greater trochanter.  This was performed using standard external rotation, extension, and adduction.  The capsule was peeled off the inner aspect of the greater trochanter, taking care to preserve the short external rotators. A cookie cutter was used to enter the femoral canal, and then the femoral canal finder was used to confirm location.  I then sequentially broached up to a size 6.  Calcar planer was used on the femoral neck remnant.  I paced a hi neck and a trial head ball. The hip was reduced.  Leg lengths were checked fluoroscopically.  The hip was dislocated and trial components were removed.  I placed the real stem followed by the real spacer and head ball.  The hip was reduced.  Fluoroscopy was used to confirm component position and leg lengths.  At 90 degrees of external rotation and extension, the hip was stable to an anterior directed force.   The wound was copiously irrigated with Irrisept solution and normal saline using pule lavage.  Marcaine solution was injected into the periarticular soft tissue.  The wound was closed in layers using #1 Vicryl and V-Loc for the fascia, 2-0 Vicryl for the subcutaneous fat,  2-0 Monocryl for the deep dermal layer, and staples and glue for the skin.  Once the glue was fully dried, an Aquacell Ag dressing was applied.  The patient was then awakened from anesthesia and transported to the recovery room in stable condition.  Sponge, needle, and instrument counts were correct at the end of the case x2.  The patient tolerated the procedure well  and there were no known complications.  Please note that a surgical assistant was a medical necessity for this procedure to perform it in a safe and expeditious manner. Assistant was necessary to provide appropriate retraction of vital neurovascular structures, to prevent femoral fracture, and to allow for anatomic placement of the prosthesis.

## 2020-08-10 NOTE — Discharge Instructions (Addendum)
 Dr. Brian Swinteck Joint Replacement Specialist [facility] [address] [place], [place] [address] [phone]   TOTAL HIP REPLACEMENT POSTOPERATIVE DIRECTIONS    Hip Rehabilitation, Guidelines Following Surgery   WEIGHT BEARING Weight bearing as tolerated with assist device (walker, cane, etc) as directed, use it as long as suggested by your surgeon or therapist, typically at least 4-6 weeks.  The results of a hip operation are greatly improved after range of motion and muscle strengthening exercises. Follow all safety measures which are given to protect your hip. If any of these exercises cause increased pain or swelling in your joint, decrease the amount until you are comfortable again. Then slowly increase the exercises. Call your caregiver if you have problems or questions.   HOME CARE INSTRUCTIONS  Most of the following instructions are designed to prevent the dislocation of your new hip.  Remove items at home which could result in a fall. This includes throw rugs or furniture in walking pathways.  Continue medications as instructed at time of discharge. You may have some home medications which will be placed on hold until you complete the course of blood thinner medication. You may start showering once you are discharged home. Do not remove your dressing. Do not put on socks or shoes without following the instructions of your caregivers.   Sit on chairs with arms. Use the chair arms to help push yourself up when arising.  Arrange for the use of a toilet seat elevator so you are not sitting low.  Walk with walker as instructed.  You may resume a sexual relationship in one month or when given the OK by your caregiver.  Use walker as long as suggested by your caregivers.  You may put full weight on your legs and walk as much as is comfortable. Avoid periods of inactivity such as sitting longer than an hour when not asleep. This helps prevent blood  clots.  You may return to work once you are cleared by your surgeon.  Do not drive a car for 6 weeks or until released by your surgeon.  Do not drive while taking narcotics.  Wear elastic stockings for two weeks following surgery during the day but you may remove then at night.  Make sure you keep all of your appointments after your operation with all of your doctors and caregivers. You should call the office at the above phone number and make an appointment for approximately two weeks after the date of your surgery. Please pick up a stool softener and laxative for home use as long as you are requiring pain medications. ICE to the affected hip every three hours for 30 minutes at a time and then as needed for pain and swelling. Continue to use ice on the hip for pain and swelling from surgery. You may notice swelling that will progress down to the foot and ankle.  This is normal after surgery.  Elevate the leg when you are not up walking on it.   It is important for you to complete the blood thinner medication as prescribed by your doctor. Continue to use the breathing machine which will help keep your temperature down.  It is common for your temperature to cycle up and down following surgery, especially at night when you are not up moving around and exerting yourself.  The breathing machine keeps your lungs expanded and your temperature down.  RANGE OF MOTION AND STRENGTHENING EXERCISES  These exercises are designed to help you   keep full movement of your hip joint. Follow your caregiver's or physical therapist's instructions. Perform all exercises about fifteen times, three times per day or as directed. Exercise both hips, even if you have had only one joint replacement. These exercises can be done on a training (exercise) mat, on the floor, on a table or on a bed. Use whatever works the best and is most comfortable for you. Use music or television while you are exercising so that the exercises are a  pleasant break in your day. This will make your life better with the exercises acting as a break in routine you can look forward to.  ?Lying on your back, slowly slide your foot toward your buttocks, raising your knee up off the floor. Then slowly slide your foot back down until your leg is straight again.  ?Lying on your back spread your legs as far apart as you can without causing discomfort.  ?Lying on your side, raise your upper leg and foot straight up from the floor as far as is comfortable. Slowly lower the leg and repeat.  ?Lying on your back, tighten up the muscle in the front of your thigh (quadriceps muscles). You can do this by keeping your leg straight and trying to raise your heel off the floor. This helps strengthen the largest muscle supporting your knee.  ?Lying on your back, tighten up the muscles of your buttocks both with the legs straight and with the knee bent at a comfortable angle while keeping your heel on the floor.  ? ?SKILLED REHAB INSTRUCTIONS: ?If the patient is transferred to a skilled rehab facility following release from the hospital, a list of the current medications will be sent to the facility for the patient to continue.  When discharged from the skilled rehab facility, please have the facility set up the patient's Home Health Physical Therapy prior to being released. Also, the skilled facility will be responsible for providing the patient with their medications at time of release from the facility to include their pain medication and their blood thinner medication. If the patient is still at the rehab facility at time of the two week follow up appointment, the skilled rehab facility will also need to assist the patient in arranging follow up appointment in our office and any transportation needs. ? ?POST-OPERATIVE OPIOID TAPER INSTRUCTIONS: ?It is important to wean off of your opioid medication as soon as possible. If you do not need pain medication after your surgery it is ok  to stop day one. ?Opioids include: ?Codeine, Hydrocodone(Norco, Vicodin), Oxycodone(Percocet, oxycontin) and hydromorphone amongst others.  ?Long term and even short term use of opiods can cause: ?Increased pain response ?Dependence ?Constipation ?Depression ?Respiratory depression ?And more.  ?Withdrawal symptoms can include ?Flu like symptoms ?Nausea, vomiting ?And more ?Techniques to manage these symptoms ?Hydrate well ?Eat regular healthy meals ?Stay active ?Use relaxation techniques(deep breathing, meditating, yoga) ?Do Not substitute Alcohol to help with tapering ?If you have been on opioids for less than two weeks and do not have pain than it is ok to stop all together.  ?Plan to wean off of opioids ?This plan should start within one week post op of your joint replacement. ?Maintain the same interval or time between taking each dose and first decrease the dose.  ?Cut the total daily intake of opioids by one tablet each day ?Next start to increase the time between doses. ?The last dose that should be eliminated is the evening dose.  ? ? ?MAKE   SURE YOU:  Understand these instructions.  Will watch your condition.  Will get help right away if you are not doing well or get worse.  Pick up stool softner and laxative for home use following surgery while on pain medications. Do not remove your dressing. The dressing is waterproof--it is OK to take showers. Continue to use ice for pain and swelling after surgery. Do not use any lotions or creams on the incision until instructed by your surgeon. Total Hip Protocol.     Information on my medicine - XARELTO (Rivaroxaban)  Why was Xarelto prescribed for you? Xarelto was prescribed for you to reduce the risk of a blood clot forming that can cause a stroke if you have a medical condition called atrial fibrillation (a type of irregular heartbeat).  What do you need to know about xarelto ? Take your Xarelto ONCE DAILY at the same time every day with  your evening meal. If you have difficulty swallowing the tablet whole, you may crush it and mix in applesauce just prior to taking your dose.  Take Xarelto exactly as prescribed by your doctor and DO NOT stop taking Xarelto without talking to the doctor who prescribed the medication.  Stopping without other stroke prevention medication to take the place of Xarelto may increase your risk of developing a clot that causes a stroke.  Refill your prescription before you run out.  After discharge, you should have regular check-up appointments with your healthcare provider that is prescribing your Xarelto.  In the future your dose may need to be changed if your kidney function or weight changes by a significant amount.  What do you do if you miss a dose? If you are taking Xarelto ONCE DAILY and you miss a dose, take it as soon as you remember on the same day then continue your regularly scheduled once daily regimen the next day. Do not take two doses of Xarelto at the same time or on the same day.   Important Safety Information A possible side effect of Xarelto is bleeding. You should call your healthcare provider right away if you experience any of the following: Bleeding from an injury or your nose that does not stop. Unusual colored urine (red or dark brown) or unusual colored stools (red or black). Unusual bruising for unknown reasons. A serious fall or if you hit your head (even if there is no bleeding).  Some medicines may interact with Xarelto and might increase your risk of bleeding while on Xarelto. To help avoid this, consult your healthcare provider or pharmacist prior to using any new prescription or non-prescription medications, including herbals, vitamins, non-steroidal anti-inflammatory drugs (NSAIDs) and supplements.  This website has more information on Xarelto: [email or web address]

## 2020-08-10 NOTE — Interval H&P Note (Signed)
History and Physical Interval Note:  08/10/2020 12:18 PM  Lance Smith  has presented today for surgery, with the diagnosis of Right Hip Fx.  The various methods of treatment have been discussed with the patient and family. After consideration of risks, benefits and other options for treatment, the patient has consented to  Procedure(s): TOTAL HIP ARTHROPLASTY ANTERIOR APPROACH (Right) as a surgical intervention.  The patient's history has been reviewed, patient examined, no change in status, stable for surgery.  I have reviewed the patient's chart and labs.  Questions were answered to the patient's satisfaction.    The risks, benefits, and alternatives were discussed with the patient. There are risks associated with the surgery including, but not limited to, problems with anesthesia (death), infection, instability (giving out of the joint), dislocation, differences in leg length/angulation/rotation, fracture of bones, loosening or failure of implants, hematoma (blood accumulation) which may require surgical drainage, blood clots, pulmonary embolism, nerve injury (foot drop and lateral thigh numbness), and blood vessel injury. The patient understands these risks and elects to proceed.    Hilton Cork Ximenna Fonseca

## 2020-08-10 NOTE — Transfer of Care (Signed)
Immediate Anesthesia Transfer of Care Note  Patient: Lance Smith  Procedure(s) Performed: TOTAL HIP ARTHROPLASTY ANTERIOR APPROACH (Right: Hip)  Patient Location: PACU  Anesthesia Type:General  Level of Consciousness: drowsy  Airway & Oxygen Therapy: Patient Spontanous Breathing and Patient connected to nasal cannula oxygen  Post-op Assessment: Report given to RN and Post -op Vital signs reviewed and stable  Post vital signs: Reviewed and stable  Last Vitals:  Vitals Value Taken Time  BP 129/67 08/10/20 1506  Temp    Pulse 82 08/10/20 1508  Resp 14 08/10/20 1508  SpO2 90 % 08/10/20 1508  Vitals shown include unvalidated device data.  Last Pain:  Vitals:   08/10/20 1054  TempSrc: Oral  PainSc: 5       Patients Stated Pain Goal: 3 ([id number])  Complications: No notable events documented.

## 2020-08-10 NOTE — Anesthesia Postprocedure Evaluation (Signed)
Anesthesia Post Note  Patient: YURIEL TORNO  Procedure(s) Performed: TOTAL HIP ARTHROPLASTY ANTERIOR APPROACH (Right: Hip)     Patient location during evaluation: PACU Anesthesia Type: General Level of consciousness: awake Pain management: pain level controlled Vital Signs Assessment: post-procedure vital signs reviewed and stable Respiratory status: spontaneous breathing, nonlabored ventilation, respiratory function stable and patient connected to nasal cannula oxygen Cardiovascular status: blood pressure returned to baseline and stable Postop Assessment: no apparent nausea or vomiting Anesthetic complications: no   No notable events documented.  Last Vitals:  Vitals:   08/10/20 1534 08/10/20 1555  BP: (!) 113/57 108/63  Pulse: 80 73  Resp: 19   Temp: (!) 36.3 C 36.5 C  SpO2: 95% 93%    Last Pain:  Vitals:   08/10/20 1630  TempSrc:   PainSc: 10-Worst pain ever                 Elgene Coral P Regnald Bowens

## 2020-08-10 NOTE — Anesthesia Procedure Notes (Signed)
Procedure Name: Intubation Date/Time: 08/10/2020 1:09 PM Performed by: Clearnce Sorrel, CRNA Pre-anesthesia Checklist: Patient identified, Emergency Drugs available, Suction available and Patient being monitored Patient Re-evaluated:Patient Re-evaluated prior to induction Oxygen Delivery Method: Circle System Utilized Preoxygenation: Pre-oxygenation with 100% oxygen Induction Type: IV induction Ventilation: Mask ventilation without difficulty Laryngoscope Size: Mac and 4 Grade View: Grade I Tube type: Oral Tube size: 7.5 mm Number of attempts: 1 Airway Equipment and Method: Stylet and Oral airway Placement Confirmation: ETT inserted through vocal cords under direct vision, positive ETCO2 and breath sounds checked- equal and bilateral Secured at: 23 cm Tube secured with: Tape Dental Injury: Teeth and Oropharynx as per pre-operative assessment

## 2020-08-10 NOTE — Anesthesia Preprocedure Evaluation (Signed)
Anesthesia Evaluation  Patient identified by MRN, date of birth, ID band Patient awake    Reviewed: Allergy & Precautions, NPO status , Patient's Chart, lab work & pertinent test results  Airway Mallampati: III  TM Distance: >3 FB Neck ROM: Full    Dental  (+) Edentulous Lower, Edentulous Upper   Pulmonary COPD,  COPD inhaler, Current Smoker and Patient abstained from smoking.,    Pulmonary exam normal breath sounds clear to auscultation       Cardiovascular hypertension, + CAD, + Past MI, +CHF and + DVT  Normal cardiovascular exam+ dysrhythmias + pacemaker  Rhythm:Regular Rate:Normal  ECG: paced, rate 63  ECHO: 1. Left ventricular ejection fraction, by estimation, is 60 to 65%. The left ventricle has normal function. The left ventricle has no regional wall motion abnormalities. The left ventricular internal cavity size was moderately dilated. There is mild left ventricular hypertrophy. Left ventricular diastolic parameters are consistent with Grade I diastolic dysfunction (impaired relaxation).  2. Right ventricular systolic function is normal. The right ventricular size is normal.  3. Left atrial size was moderately dilated.  4. The mitral valve is normal in structure. No evidence of mitral valve regurgitation. No evidence of mitral stenosis.  5. The aortic valve is tricuspid. Aortic valve regurgitation is mild. Mild to moderate aortic valve sclerosis/calcification is present, without any evidence of aortic stenosis.  6. The inferior vena cava is normal in size with greater than 50%  respiratory variability, suggesting right atrial pressure of 3 mmHg.    Neuro/Psych negative neurological ROS  negative psych ROS   GI/Hepatic negative GI ROS, Neg liver ROS,   Endo/Other  diabetes, Oral Hypoglycemic Agents  Renal/GU Renal disease     Musculoskeletal negative musculoskeletal ROS (+)   Abdominal   Peds  Hematology HLD    Anesthesia Other Findings Right Hip Fracture  Reproductive/Obstetrics                             Anesthesia Physical Anesthesia Plan  ASA: 3  Anesthesia Plan: General   Post-op Pain Management:    Induction: Intravenous  PONV Risk Score and Plan: 1 and Ondansetron, Dexamethasone and Treatment may vary due to age or medical condition  Airway Management Planned: Oral ETT  Additional Equipment:   Intra-op Plan:   Post-operative Plan: Extubation in OR  Informed Consent: I have reviewed the patients History and Physical, chart, labs and discussed the procedure including the risks, benefits and alternatives for the proposed anesthesia with the patient or authorized representative who has indicated his/her understanding and acceptance.       Plan Discussed with: CRNA  Anesthesia Plan Comments:         Anesthesia Quick Evaluation

## 2020-08-10 NOTE — Progress Notes (Signed)
Attempted calling report to [phone]; but no answer. Will try again later. VWilliams,RN.

## 2020-08-10 NOTE — Progress Notes (Signed)
PROGRESS NOTE  JADIN NOVELLO [id number] DOB: December 15, 1931 DOA: 08/06/2020 PCP: Burnard Bunting, MD  Brief History   87 year old man PMH including atrial fibrillation, complete heart block status post pacemaker implant 2013, CAD, prior MI, diastolic CHF, recurrent DVT, on apixaban, presented with hip pain after fall.  Admitted for closed right hip fracture.  Operative intervention delayed by anticoagulation.  Hip replacement 7/24.  A & P  * Closed right hip fracture (HCC) -- Status post mechanical fall at home. -- surgery today 7/24  Apixaban on hold. -- Heparin drip perioperatively given history of recurrent DVTs. -- Hip fracture protocol for pain management, etc.  Emphysema lung ([place]) --remains stable. Bronchodilators as needed. Not on oxygen at home.  CAD (coronary artery disease),Prior IMI  -- Reported chest pain mild in nature.  Seen by cardiology, noted to be at high risk for surgery but cleared without further intervention or evaluation. -- Plavix on hold for surgery. -- Continue statin. -- Resume Plavix when cleared by orthopedics. --stable today, no CP  Atrial fibrillation (HCC) -- Paced rhythm on EKG.  Stable, continue amiodarone.  Type 2 diabetes mellitus without complication (HCC) -- remains stable.  Metformin on hold during hospitalization. -- Sliding scale insulin.  DVT, lower extremity, recurrent (HCC) -- Apixaban on hold, heparin bridge for now  Diastolic heart failure (HCC) -- Appears euvolemic.  Continue torsemide. -- Last echocardiogram August 2021 with LVEF 60 to 65%.  Grade 1 diastolic dysfunction.  Aortic atherosclerosis ([place]) --continue statin  Complete heart block ([place]) -- Status post pacemaker insertion 2013.  EKG shows paced rhythm.  Disposition Plan:  Discussion: surgery today  Status is: Inpatient  Remains inpatient appropriate because:Inpatient level of care appropriate due to severity of illness  Dispo: The patient is from: Home               Anticipated d/c is to: SNF              Patient currently is not medically stable to d/c.   Difficult to place patient No  DVT prophylaxis: heparin gtt perioperatively   Code Status: Full Code Level of care: Telemetry Medical Family Communication: wife Deneise Lever and grandson at bedside  Murray Hodgkins, MD  [facility] Direct contact: see [email or web address] (further directions at bottom of note if needed) 7PM-7AM contact night coverage as at bottom of note 08/10/2020, 1:29 PM  LOS: 4 days   Significant Hospital Events   7/30 admit for hip fx   Consults:  Orthopedics Cardiology    Procedures:    Interval History/Subjective  CC: f/u hip fx  Reports right hip pain Breathing ok No CP Wife and grandson at bedside  Objective   Vitals:  Vitals:   08/10/20 0805 08/10/20 1054  BP: 131/67 113/81  Pulse: 75 78  Resp:  16  Temp: 97.6 F (36.4 C) 98.3 F (36.8 C)  SpO2: 92%     Exam: Physical Exam Vitals and nursing note reviewed.  Constitutional:      General: He is not in acute distress.    Appearance: He is not toxic-appearing.  Cardiovascular:     Rate and Rhythm: Normal rate and regular rhythm.     Heart sounds: No murmur heard. Pulmonary:     Effort: Pulmonary effort is normal. No respiratory distress.     Breath sounds: Normal breath sounds. No wheezing, rhonchi or rales.  Musculoskeletal:     Right lower leg: No edema.     Left lower leg: No edema.  Comments: Moves both feet  Neurological:     Mental Status: He is alert.  Psychiatric:        Mood and Affect: Mood normal.        Behavior: Behavior normal.    I have personally reviewed the labs and other data, making special note of:   Today's Data  CBG  remains stable Hgb stable 12.7  Scheduled Meds:  [MAR Hold] amiodarone  200 mg Oral Daily   [MAR Hold] atorvastatin  20 mg Oral Daily   [MAR Hold] feeding supplement (GLUCERNA SHAKE)  237 mL Oral TID BM   [MAR Hold] insulin aspart  0-9 Units  Subcutaneous TID WC   [MAR Hold] multivitamin with minerals  1 tablet Oral Daily   [MAR Hold] nicotine  21 mg Transdermal Daily   [MAR Hold] polyethylene glycol  17 g Oral BID   povidone-iodine  2 application Topical Once   [MAR Hold] predniSONE  10 mg Oral Q breakfast   [MAR Hold] senna  1 tablet Oral QHS   [MAR Hold] tamsulosin  0.4 mg Oral Daily   [MAR Hold] torsemide  40 mg Oral QODAY   [MAR Hold] triamcinolone cream  1 application Topical BID   Continuous Infusions:  lactated ringers 10 mL/hr at 08/10/20 1104    Principal Problem:   Closed right hip fracture (HCC) Active Problems:   Atrial fibrillation (HCC)   CAD (coronary artery disease),Prior IMI    Emphysema lung (HCC)   Diastolic heart failure (HCC)   DVT, lower extremity, recurrent (HCC)   Type 2 diabetes mellitus without complication (HCC)   Complete heart block (HCC)   Tobacco abuse   Rash   Leukocytosis   Aortic atherosclerosis ([place])   LOS: 4 days   How to contact the [facility] Attending or Consulting provider 7A - 7P or covering provider during after hours [place], for this patient?  Check the care team in [facility] and look for a) attending/consulting TRH provider listed and b) the [facility] team listed Log into [email or web address] and use [place]'s universal password to access. If you do not have the password, please contact the hospital operator. Locate the [facility] provider you are looking for under [facility] and page to a number that you can be directly reached. If you still have difficulty reaching the provider, please page the [facility] (Director on Call) for the Hospitalists listed on amion for assistance.

## 2020-08-11 ENCOUNTER — Encounter (HOSPITAL_COMMUNITY): Payer: Self-pay | Admitting: Orthopedic Surgery

## 2020-08-11 DIAGNOSIS — I4891 Unspecified atrial fibrillation: Secondary | ICD-10-CM | POA: Diagnosis not present

## 2020-08-11 DIAGNOSIS — I5032 Chronic diastolic (congestive) heart failure: Secondary | ICD-10-CM | POA: Diagnosis not present

## 2020-08-11 DIAGNOSIS — S72001A Fracture of unspecified part of neck of right femur, initial encounter for closed fracture: Secondary | ICD-10-CM | POA: Diagnosis not present

## 2020-08-11 DIAGNOSIS — I82409 Acute embolism and thrombosis of unspecified deep veins of unspecified lower extremity: Secondary | ICD-10-CM | POA: Diagnosis not present

## 2020-08-11 LAB — BASIC METABOLIC PANEL
Anion gap: 9 (ref 5–15)
Anion gap: 7 (ref 5–15)
BUN: 23 mg/dL (ref 8–23)
BUN: 26 mg/dL — ABNORMAL HIGH (ref 8–23)
CO2: 24 mmol/L (ref 22–32)
CO2: 29 mmol/L (ref 22–32)
Calcium: 8.4 mg/dL — ABNORMAL LOW (ref 8.9–10.3)
Calcium: 8.6 mg/dL — ABNORMAL LOW (ref 8.9–10.3)
Chloride: 99 mmol/L (ref 98–111)
Chloride: 99 mmol/L (ref 98–111)
Creatinine, Ser: 1.33 mg/dL — ABNORMAL HIGH (ref 0.61–1.24)
Creatinine, Ser: 1.28 mg/dL — ABNORMAL HIGH (ref 0.61–1.24)
GFR, Estimated: 52 mL/min — ABNORMAL LOW (ref >60)
GFR, Estimated: 54 mL/min — ABNORMAL LOW (ref >60)
Glucose, Bld: 237 mg/dL — ABNORMAL HIGH (ref 70–99)
Glucose, Bld: 169 mg/dL — ABNORMAL HIGH (ref 70–99)
Potassium: 5.7 mmol/L — ABNORMAL HIGH (ref 3.5–5.1)
Potassium: 5.2 mmol/L — ABNORMAL HIGH (ref 3.5–5.1)
Sodium: 132 mmol/L — ABNORMAL LOW (ref 135–145)
Sodium: 135 mmol/L (ref 135–145)

## 2020-08-11 LAB — GLUCOSE, CAPILLARY
Glucose-Capillary: 190 mg/dL — ABNORMAL HIGH (ref 70–99)
Glucose-Capillary: 213 mg/dL — ABNORMAL HIGH (ref 70–99)
Glucose-Capillary: 126 mg/dL — ABNORMAL HIGH (ref 70–99)
Glucose-Capillary: 219 mg/dL — ABNORMAL HIGH (ref 70–99)

## 2020-08-11 LAB — CBC
HCT: 36.4 % — ABNORMAL LOW (ref 39.0–52.0)
Hemoglobin: 12.3 g/dL — ABNORMAL LOW (ref 13.0–17.0)
MCH: 35.1 pg — ABNORMAL HIGH (ref 26.0–34.0)
MCHC: 33.8 g/dL (ref 30.0–36.0)
MCV: 104 fL — ABNORMAL HIGH (ref 80.0–100.0)
Platelets: 244 10*3/uL (ref 150–400)
RBC: 3.5 MIL/uL — ABNORMAL LOW (ref 4.22–5.81)
RDW: 12.7 % (ref 11.5–15.5)
WBC: 14.7 10*3/uL — ABNORMAL HIGH (ref 4.0–10.5)
nRBC: 0 % (ref 0.0–0.2)

## 2020-08-11 MED ORDER — CLOPIDOGREL BISULFATE 75 MG PO TABS
75.0000 mg | ORAL_TABLET | Freq: Every day | ORAL | Status: DC
  Administered 2020-08-11 – 2020-08-16 (×6): 75 mg via ORAL
  Filled 2020-08-11 (×6): qty 1

## 2020-08-11 MED ORDER — MINERAL OIL RE ENEM
1.0000 | ENEMA | Freq: Once | RECTAL | Status: AC
  Administered 2020-08-11: 1 via RECTAL
  Filled 2020-08-11: qty 1

## 2020-08-11 NOTE — TOC Progression Note (Signed)
Transition of Care [facility]) - Progression Note    Patient Details  Name: Lance Smith MRN: [id number] Date of Birth: 30-Jan-1931  Transition of Care [facility]) CM/SW Contact  Milinda Antis, [place] Phone Number: 08/11/2020, 4:30 PM  Clinical Narrative:    CSW received consult for SNF placement.  CSW attempted to speak with patient, but patient reported being unable to hear CSW.  CSW will try to meet with patient tomorrow.     Expected Discharge Plan: [place] (vs SNF) Barriers to Discharge: Continued Medical Work up  Expected Discharge Plan and Services Expected Discharge Plan: [place] (vs SNF)                                               Social Determinants of Health (SDOH) Interventions    Readmission Risk Interventions No flowsheet data found.

## 2020-08-11 NOTE — Care Management Important Message (Signed)
Important Message  Patient Details  Name: Lance Smith MRN: [id number] Date of Birth: 13-Sep-1931   Medicare Important Message Given:  Yes     Orbie Pyo 08/11/2020, 4:46 PM

## 2020-08-11 NOTE — Evaluation (Signed)
Physical Therapy Evaluation Patient Details Name: Lance Smith MRN: [id number] DOB: [birth year] Today's Date: 08/11/2020   History of Present Illness  87 yo male presents to [facility] on 7/25 with fall at home, sustained R hip fx. s/p R DA-THA on 7/24. PMH includes OA, CKD III, complete heart block s/p packemaker, COPD, DM, HTN, HLD.  Clinical Impression  Pt presents with impaired strength, post-operative R hip pain, difficulty performing mobility tasks, dyspnea on exertion with accompanying O2 desaturation of RA, impaired balance with history of falls, and decreased activity tolerance vs baseline. Pt to benefit from acute PT to address deficits. Pt ambulated short room distance with RW, overall requiring min-mod assist for mobility. At home, pt is primary caregiver for both his son and wife, PT recommending ST-SNF to maximize pt functional recovery before he returns to caregiver role. PT to progress mobility as tolerated, and will continue to follow acutely.      Follow Up Recommendations SNF    Equipment Recommendations  Other (comment) (TBD by next venue)    Recommendations for Other Services       Precautions / Restrictions Precautions Precautions: Fall Restrictions Weight Bearing Restrictions: No RLE Weight Bearing: Weight bearing as tolerated      Mobility  Bed Mobility Overal bed mobility: Needs Assistance Bed Mobility: Supine to Sit     Supine to sit: Mod assist;HOB elevated     General bed mobility comments: Mod assist for trunk elevation via HHA, LE translation to EOB.    Transfers Overall transfer level: Needs assistance Equipment used: Rolling walker (2 wheeled) Transfers: Sit to/from Stand Sit to Stand: Min assist;From elevated surface         General transfer comment: Min assist for power up, steadying upon standing, VC for hand placement when rising/sitting.  Ambulation/Gait Ambulation/Gait assistance: Min assist Gait Distance (Feet): 5 Feet Assistive  device: Rolling walker (2 wheeled) Gait Pattern/deviations: Step-through pattern;Decreased stride length;Trunk flexed;Antalgic Gait velocity: decr   General Gait Details: light assist to steady, guide pt to recliner. slowed steps, limited by pain.  Stairs            Wheelchair Mobility    Modified Rankin (Stroke Patients Only)       Balance Overall balance assessment: Needs assistance;History of Falls Sitting-balance support: No upper extremity supported;Feet supported Sitting balance-Leahy Scale: Fair     Standing balance support: Bilateral upper extremity supported;During functional activity Standing balance-Leahy Scale: Poor Standing balance comment: reliant on external support                             Pertinent Vitals/Pain Pain Assessment: 0-10 Pain Score: 6  Pain Location: R hip Pain Descriptors / Indicators: Sore;Discomfort;Grimacing Pain Intervention(s): Limited activity within patient's tolerance;Monitored during session;Repositioned;Premedicated before session    Home Living Family/patient expects to be discharged to:: Private residence Living Arrangements: Spouse/significant other;Children (pt's son is disabled, pt's wife has chronic back and memory problems) Available Help at Discharge: Family Type of Home: House Home Access: Stairs to enter   Technical brewer of Steps: 1 Home Layout: One level Home Equipment: Shower seat;Cane - single point Additional Comments: pt's son and wife have a RW, but not pt.    Prior Function Level of Independence: Independent with assistive device(s)         Comments: pt intermittently uses a cane. Pt helps his son and wife with morning routine, mobility as needed. Pt's grandchildren are helping them while he  is hospitalized.     Hand Dominance   Dominant Hand: Right    Extremity/Trunk Assessment   Upper Extremity Assessment Upper Extremity Assessment: Defer to OT evaluation    Lower  Extremity Assessment Lower Extremity Assessment: Generalized weakness    Cervical / Trunk Assessment Cervical / Trunk Assessment: Normal  Communication   Communication: HOH  Cognition Arousal/Alertness: Awake/alert Behavior During Therapy: WFL for tasks assessed/performed Overall Cognitive Status: Within Functional Limits for tasks assessed                                 General Comments: very HOH, requires PT to repeat questions      General Comments General comments (skin integrity, edema, etc.): Spo2 84% on RA post-transfer, requiring 2LO2 to maintain SpO2 >88%.    Exercises General Exercises - Lower Extremity Long Arc Quad: AROM;Right;10 reps;Seated   Assessment/Plan    PT Assessment Patient needs continued PT services  PT Problem List Decreased strength;Decreased mobility;Decreased safety awareness;Decreased activity tolerance;Decreased balance;Decreased knowledge of use of DME;Pain;Cardiopulmonary status limiting activity       PT Treatment Interventions DME instruction;Therapeutic activities;Gait training;Therapeutic exercise;Patient/family education;Balance training;Functional mobility training;Neuromuscular re-education;Stair training    PT Goals (Current goals can be found in the Care Plan section)  Acute Rehab PT Goals Patient Stated Goal: get better so I can help my family PT Goal Formulation: With patient Time For Goal Achievement: 08/25/20 Potential to Achieve Goals: Good    Frequency Min 3X/week   Barriers to discharge        Co-evaluation               AM-PAC PT "6 Clicks" Mobility  Outcome Measure Help needed turning from your back to your side while in a flat bed without using bedrails?: A Little Help needed moving from lying on your back to sitting on the side of a flat bed without using bedrails?: A Lot Help needed moving to and from a bed to a chair (including a wheelchair)?: A Lot Help needed standing up from a chair using  your arms (e.g., wheelchair or bedside chair)?: A Little Help needed to walk in hospital room?: A Little Help needed climbing 3-5 steps with a railing? : A Lot 6 Click Score: 15    End of Session Equipment Utilized During Treatment: Gait belt;Oxygen Activity Tolerance: Patient tolerated treatment well;Patient limited by pain;Patient limited by fatigue Patient left: in chair;with chair alarm set;with call bell/phone within reach Nurse Communication: Mobility status PT Visit Diagnosis: Other abnormalities of gait and mobility (R26.89);Difficulty in walking, not elsewhere classified (R26.2);Pain Pain - Right/Left: Right Pain - part of body: Hip    Time: 1036-1101 PT Time Calculation (min) (ACUTE ONLY): 25 min   Charges:   PT Evaluation $PT Eval Low Complexity: 1 Low PT Treatments $Therapeutic Activity: 8-22 mins        Stacie Glaze, PT DPT Acute Rehabilitation Services Pager [phone]  Office [phone]   Roxine Caddy E Ruffin Pyo 08/11/2020, 12:24 PM

## 2020-08-11 NOTE — Progress Notes (Signed)
PROGRESS NOTE  Lance Smith [id number] DOB: 07/20/1931 DOA: 08/06/2020 PCP: Burnard Bunting, MD  Brief History   87 year old man PMH including atrial fibrillation, complete heart block status post pacemaker implant 2013, CAD, prior MI, diastolic CHF, recurrent DVT, on apixaban, presented with hip pain after fall.  Admitted for closed right hip fracture.  Operative intervention delayed by anticoagulation.  S/p hip replacement 7/24.  A & P  * Closed right hip fracture (HCC) -- Status post mechanical fall at home. -- s/p surgery 7/24  -- resumed Xarelto -- Hip fracture protocol for pain management, etc.  Emphysema lung ([place]) --remains stable. Bronchodilators as needed. Not on oxygen at home.  CAD (coronary artery disease),Prior IMI  -- Seen by cardiology, noted to be at high risk for surgery but cleared without further intervention or evaluation. -- Plavix on hold for surgery, resume when ok with orthopedics -- Continue statin. -- remains stable  Atrial fibrillation (HCC) -- Paced rhythm on EKG.  Stable, continue amiodarone.  Type 2 diabetes mellitus without complication (HCC) -- remains stable.  Metformin on hold during hospitalization. -- Sliding scale insulin.  DVT, lower extremity, recurrent ([place]) -- resumed Xarelto   Chronic diastolic CHF (congestive heart failure) (HCC) -- Appears euvolemic.  Continue torsemide. -- Last echocardiogram August 2021 with LVEF 60 to 65%.  Grade 1 diastolic dysfunction.  Aortic atherosclerosis ([place]) --continue statin  Complete heart block ([place]) -- Status post pacemaker insertion 2013.  EKG shows paced rhythm.  Disposition Plan:  Discussion: doing well s/p surgery  Status is: Inpatient  Remains inpatient appropriate because:Inpatient level of care appropriate due to severity of illness  Dispo: The patient is from: Home              Anticipated d/c is to: SNF              Patient currently is not medically stable to d/c.    Difficult to place patient No  DVT prophylaxis: rivaroxaban (XARELTO) tablet 10 mg Start: 08/11/20 1000 SCDs Start: 08/10/20 1539heparin gtt perioperatively rivaroxaban (XARELTO) tablet 10 mg   Code Status: Full Code Level of care: Telemetry Medical Family Communication: none  Murray Hodgkins, MD  [facility] Direct contact: see [email or web address] (further directions at bottom of note if needed) 7PM-7AM contact night coverage as at bottom of note 08/11/2020, 2:43 PM  LOS: 5 days   Significant Hospital Events   7/30 admit for hip fx   Consults:  Orthopedics Cardiology    Procedures:    Interval History/Subjective  CC: f/u hip fx  Feels ok today, pain at a 3 while sitting Breathing fine  Objective   Vitals:  Vitals:   08/11/20 0807 08/11/20 1436  BP: 114/73 115/64  Pulse: 68 71  Resp: 14 17  Temp: 99 F (37.2 C) 98.3 F (36.8 C)  SpO2: 95% 94%    Exam: Physical Exam Constitutional:      General: He is not in acute distress. Cardiovascular:     Rate and Rhythm: Normal rate and regular rhythm.     Heart sounds: No murmur heard. Pulmonary:     Effort: Pulmonary effort is normal. No respiratory distress.     Breath sounds: Normal breath sounds. No wheezing or rales.  Musculoskeletal:     Right lower leg: No edema.     Left lower leg: No edema.  Neurological:     Mental Status: He is alert.  Psychiatric:        Mood and Affect: Mood normal.  Behavior: Behavior normal.   I have personally reviewed the labs and other data, making special note of:   Today's Data  CBG stable K+ 5.2 Creatinine down to 1.28 Hgb stable 12.3  Scheduled Meds:  amiodarone  200 mg Oral Daily   atorvastatin  20 mg Oral Daily   docusate sodium  100 mg Oral BID   feeding supplement (GLUCERNA SHAKE)  237 mL Oral TID BM   insulin aspart  0-9 Units Subcutaneous TID WC   nicotine  21 mg Transdermal Daily   polyethylene glycol  17 g Oral BID   rivaroxaban  10 mg Oral Daily    senna  1 tablet Oral BID   tamsulosin  0.4 mg Oral Daily   torsemide  40 mg Oral QODAY   triamcinolone cream  1 application Topical BID   Continuous Infusions:    Principal Problem:   Closed right hip fracture (HCC) Active Problems:   Atrial fibrillation (HCC)   CAD (coronary artery disease),Prior IMI    Emphysema lung (HCC)   Chronic diastolic CHF (congestive heart failure) (HCC)   DVT, lower extremity, recurrent (HCC)   Type 2 diabetes mellitus without complication (HCC)   Complete heart block (HCC)   Tobacco abuse   Rash   Leukocytosis   Aortic atherosclerosis ([place])   LOS: 5 days   How to contact the [facility] Attending or Consulting provider 7A - 7P or covering provider during after hours [place], for this patient?  Check the care team in [facility] and look for a) attending/consulting TRH provider listed and b) the [facility] team listed Log into [email or web address] and use [place]'s universal password to access. If you do not have the password, please contact the hospital operator. Locate the [facility] provider you are looking for under [facility] and page to a number that you can be directly reached. If you still have difficulty reaching the provider, please page the [facility] (Director on Call) for the Hospitalists listed on amion for assistance.

## 2020-08-12 DIAGNOSIS — I4891 Unspecified atrial fibrillation: Secondary | ICD-10-CM | POA: Diagnosis not present

## 2020-08-12 DIAGNOSIS — I82409 Acute embolism and thrombosis of unspecified deep veins of unspecified lower extremity: Secondary | ICD-10-CM | POA: Diagnosis not present

## 2020-08-12 DIAGNOSIS — I5032 Chronic diastolic (congestive) heart failure: Secondary | ICD-10-CM | POA: Diagnosis not present

## 2020-08-12 DIAGNOSIS — S72001A Fracture of unspecified part of neck of right femur, initial encounter for closed fracture: Secondary | ICD-10-CM | POA: Diagnosis not present

## 2020-08-12 LAB — BASIC METABOLIC PANEL
Anion gap: 6 (ref 5–15)
BUN: 27 mg/dL — ABNORMAL HIGH (ref 8–23)
CO2: 31 mmol/L (ref 22–32)
Calcium: 8.5 mg/dL — ABNORMAL LOW (ref 8.9–10.3)
Chloride: 101 mmol/L (ref 98–111)
Creatinine, Ser: 1.24 mg/dL (ref 0.61–1.24)
GFR, Estimated: 56 mL/min — ABNORMAL LOW (ref >60)
Glucose, Bld: 133 mg/dL — ABNORMAL HIGH (ref 70–99)
Potassium: 5.1 mmol/L (ref 3.5–5.1)
Sodium: 138 mmol/L (ref 135–145)

## 2020-08-12 LAB — CBC
HCT: 34.9 % — ABNORMAL LOW (ref 39.0–52.0)
Hemoglobin: 11.4 g/dL — ABNORMAL LOW (ref 13.0–17.0)
MCH: 34 pg (ref 26.0–34.0)
MCHC: 32.7 g/dL (ref 30.0–36.0)
MCV: 104.2 fL — ABNORMAL HIGH (ref 80.0–100.0)
Platelets: 226 10*3/uL (ref 150–400)
RBC: 3.35 MIL/uL — ABNORMAL LOW (ref 4.22–5.81)
RDW: 12.7 % (ref 11.5–15.5)
WBC: 13.4 10*3/uL — ABNORMAL HIGH (ref 4.0–10.5)
nRBC: 0 % (ref 0.0–0.2)

## 2020-08-12 LAB — GLUCOSE, CAPILLARY
Glucose-Capillary: 146 mg/dL — ABNORMAL HIGH (ref 70–99)
Glucose-Capillary: 106 mg/dL — ABNORMAL HIGH (ref 70–99)
Glucose-Capillary: 267 mg/dL — ABNORMAL HIGH (ref 70–99)
Glucose-Capillary: 116 mg/dL — ABNORMAL HIGH (ref 70–99)

## 2020-08-12 MED ORDER — HYDROCODONE-ACETAMINOPHEN 5-325 MG PO TABS
1.0000 | ORAL_TABLET | Freq: Four times a day (QID) | ORAL | Status: DC | PRN
  Administered 2020-08-12 – 2020-08-14 (×4): 2 via ORAL
  Administered 2020-08-15: 1 via ORAL
  Administered 2020-08-15: 2 via ORAL
  Filled 2020-08-12 (×4): qty 2
  Filled 2020-08-12: qty 1
  Filled 2020-08-12: qty 2

## 2020-08-12 MED ORDER — ACETAMINOPHEN 325 MG PO TABS
650.0000 mg | ORAL_TABLET | Freq: Four times a day (QID) | ORAL | Status: DC | PRN
  Filled 2020-08-12: qty 2

## 2020-08-12 NOTE — Progress Notes (Signed)
Physical Therapy Treatment Patient Details Name: Lance Smith MRN: [id number] DOB: October 07, 1931 Today's Date: 08/12/2020    History of Present Illness 87 yo male presents to [facility] on [facility] with fall at home, sustained R hip fx. s/p R DA-THA on 7/24. PMH includes OA, CKD III, complete heart block s/p packemaker, COPD, DM, HTN, HLD.    PT Comments    Pt reports post-operative RLE pain is worse today vs on eval, but agreeable to therapy session. Pt overall requiring min-mod assist to mobilize OOB and ambulate for short distance, very limited tolerance due to pain presentation. Pt performed THA exercises well, with PT light assist. SNF remains appropriate dispo at this time, will continue to follow.     Follow Up Recommendations  SNF     Equipment Recommendations  Other (comment) (TBD by next venue)    Recommendations for Other Services       Precautions / Restrictions Precautions Precautions: Fall Restrictions Weight Bearing Restrictions: No RLE Weight Bearing: Weight bearing as tolerated    Mobility  Bed Mobility Overal bed mobility: Needs Assistance Bed Mobility: Supine to Sit;Sit to Supine     Supine to sit: Mod assist;HOB elevated Sit to supine: Mod assist;HOB elevated   General bed mobility comments: mod assist for RLE>trunk management, scooting to/from EOB. Pt able to boost self up in bed with HOB flat and bed pads    Transfers Overall transfer level: Needs assistance Equipment used: Rolling walker (2 wheeled) Transfers: Sit to/from Stand Sit to Stand: Min assist;From elevated surface         General transfer comment: min assist for rise and steady, pt with poor safety awareness and hand placement when rising but improved with sitting.  Ambulation/Gait Ambulation/Gait assistance: Min assist Gait Distance (Feet): 10 Feet Assistive device: Rolling walker (2 wheeled) Gait Pattern/deviations: Step-through pattern;Decreased stride length;Trunk flexed;Antalgic Gait  velocity: decr   General Gait Details: min assist to steady, guide pt/RW. Slowed, increasingly antalgic gait with further distance walked.   Stairs             Wheelchair Mobility    Modified Rankin (Stroke Patients Only)       Balance Overall balance assessment: Needs assistance;History of Falls Sitting-balance support: No upper extremity supported;Feet supported Sitting balance-Leahy Scale: Fair     Standing balance support: Bilateral upper extremity supported;During functional activity Standing balance-Leahy Scale: Poor Standing balance comment: reliant on external support                            Cognition Arousal/Alertness: Awake/alert Behavior During Therapy: WFL for tasks assessed/performed Overall Cognitive Status: Within Functional Limits for tasks assessed                                 General Comments: very HOH, requires PT to repeat questions      Exercises Total Joint Exercises Ankle Circles/Pumps: AROM;Right;20 reps;Supine Quad Sets: AROM;Right;10 reps;Supine Heel Slides: AAROM;Right;10 reps;Supine Hip ABduction/ADduction: AAROM;Right;10 reps;Supine Long Arc Quad: AAROM;Right;10 reps;Seated    General Comments General comments (skin integrity, edema, etc.): 2LO2, SpO2 87-94%      Pertinent Vitals/Pain Pain Assessment: Faces Faces Pain Scale: Hurts little more Pain Location: R hip Pain Descriptors / Indicators: Sore;Discomfort;Grimacing Pain Intervention(s): Limited activity within patient's tolerance;Monitored during session;Repositioned    Home Living  Prior Function            PT Goals (current goals can now be found in the care plan section) Acute Rehab PT Goals Patient Stated Goal: get better so I can help my family PT Goal Formulation: With patient Time For Goal Achievement: 08/25/20 Potential to Achieve Goals: Good Progress towards PT goals: Progressing toward goals     Frequency    Min 3X/week      PT Plan Current plan remains appropriate    Co-evaluation              AM-PAC PT "6 Clicks" Mobility   Outcome Measure  Help needed turning from your back to your side while in a flat bed without using bedrails?: A Little Help needed moving from lying on your back to sitting on the side of a flat bed without using bedrails?: A Lot Help needed moving to and from a bed to a chair (including a wheelchair)?: A Lot Help needed standing up from a chair using your arms (e.g., wheelchair or bedside chair)?: A Little Help needed to walk in hospital room?: A Little Help needed climbing 3-5 steps with a railing? : A Lot 6 Click Score: 15    End of Session Equipment Utilized During Treatment: Oxygen Activity Tolerance: Patient tolerated treatment well;Patient limited by pain;Patient limited by fatigue Patient left: with call bell/phone within reach;in bed;with bed alarm set Nurse Communication: Mobility status PT Visit Diagnosis: Other abnormalities of gait and mobility (R26.89);Difficulty in walking, not elsewhere classified (R26.2);Pain Pain - Right/Left: Right Pain - part of body: Hip     Time: [id number] PT Time Calculation (min) (ACUTE ONLY): 26 min  Charges:  $Gait Training: 8-22 mins $Therapeutic Exercise: 8-22 mins                     Stacie Glaze, PT DPT Acute Rehabilitation Services Pager [phone]  Office [phone]    Roxine Caddy E Ruffin Pyo 08/12/2020, 3:51 PM

## 2020-08-12 NOTE — Progress Notes (Signed)
PROGRESS NOTE  Lance Smith [id number] DOB: [birth year] DOA: 08/06/2020 PCP: Burnard Bunting, MD  Brief History   87 year old man PMH including atrial fibrillation, complete heart block status post pacemaker implant 2013, CAD, prior MI, diastolic CHF, recurrent DVT, on apixaban, presented with hip pain after fall.  Admitted for closed right hip fracture.  Operative intervention delayed by anticoagulation.  S/p hip replacement 7/24.  Postoperative course uncomplicated.  Plan for SNF.  A & P  * Closed right hip fracture (HCC) -- Status post mechanical fall at home. -- s/p surgery 7/24  -- resumed Xarelto -- Hip fracture protocol for pain management, etc. -- stable for SNF  Emphysema lung ([place]) --remains stable. Bronchodilators as needed. Not on oxygen at home.  CAD (coronary artery disease),Prior IMI  -- Seen by cardiology, noted to be at high risk for surgery but cleared without further intervention or evaluation. -- Plavix resumed, continue statin.  Atrial fibrillation (HCC) -- Paced rhythm on EKG.  Stable, continue amiodarone.  Type 2 diabetes mellitus without complication (HCC) -- remains stable.  Metformin on hold during hospitalization. -- Sliding scale insulin.  DVT, lower extremity, recurrent ([place]) -- continue Xarelto   Chronic diastolic CHF (congestive heart failure) (HCC) -- Appears euvolemic.  Continue torsemide. -- Last echocardiogram August 2021 with LVEF 60 to 65%.  Grade 1 diastolic dysfunction.  Aortic atherosclerosis ([place]) --continue statin  Complete heart block ([place]) -- Status post pacemaker insertion 2013.  EKG shows paced rhythm.  Disposition Plan:  Discussion: stable s/p surgery, plan for SNF, medically stable 7/27  Status is: Inpatient  Remains inpatient appropriate because:Inpatient level of care appropriate due to severity of illness  Dispo: The patient is from: Home              Anticipated d/c is to: SNF              Patient currently is  not medically stable to d/c.   Difficult to place patient No  DVT prophylaxis: rivaroxaban (XARELTO) tablet 10 mg Start: 08/11/20 1000 SCDs Start: 08/10/20 1539heparin gtt perioperatively rivaroxaban (XARELTO) tablet 10 mg   Code Status: Full Code Level of care: Telemetry Medical Family Communication: called wife Deneise Lever by telephone, no answer  Murray Hodgkins, MD  [facility] Direct contact: see [email or web address] (further directions at bottom of note if needed) 7PM-7AM contact night coverage as at bottom of note 08/12/2020, 5:06 PM  LOS: 6 days   Significant Hospital Events   7/30 admit for hip fx   Consults:  Orthopedics Cardiology    Procedures:    Interval History/Subjective  CC: f/u hip fx  Feels ok today Breathing ok No pain   Objective   Vitals:  Vitals:   08/12/20 0720 08/12/20 1424  BP: 130/64 121/83  Pulse: 89 75  Resp: 17 18  Temp: 98.7 F (37.1 C) 98.6 F (37 C)  SpO2: 94% 95%    Exam: Physical Exam Vitals reviewed.  Constitutional:      General: He is not in acute distress. Cardiovascular:     Rate and Rhythm: Normal rate and regular rhythm.     Heart sounds: No murmur heard. Pulmonary:     Effort: Pulmonary effort is normal. No respiratory distress.     Breath sounds: Normal breath sounds. No wheezing, rhonchi or rales.  Neurological:     Mental Status: He is alert.  Psychiatric:        Mood and Affect: Mood normal.   I have personally reviewed the labs  and other data, making special note of:   Today's Data  CBG is stable Creatinine down to 1.24 K+ now WNL Hgb stable 11.4  Scheduled Meds:  amiodarone  200 mg Oral Daily   atorvastatin  20 mg Oral Daily   clopidogrel  75 mg Oral Daily   docusate sodium  100 mg Oral BID   feeding supplement (GLUCERNA SHAKE)  237 mL Oral TID BM   insulin aspart  0-9 Units Subcutaneous TID WC   nicotine  21 mg Transdermal Daily   polyethylene glycol  17 g Oral BID   rivaroxaban  10 mg Oral Daily    senna  1 tablet Oral BID   tamsulosin  0.4 mg Oral Daily   torsemide  40 mg Oral QODAY   triamcinolone cream  1 application Topical BID   Continuous Infusions:    Principal Problem:   Closed right hip fracture (HCC) Active Problems:   Atrial fibrillation (HCC)   CAD (coronary artery disease),Prior IMI    Emphysema lung (HCC)   Chronic diastolic CHF (congestive heart failure) (HCC)   DVT, lower extremity, recurrent (HCC)   Type 2 diabetes mellitus without complication (HCC)   Complete heart block (HCC)   Tobacco abuse   Rash   Leukocytosis   Aortic atherosclerosis ([place])   LOS: 6 days   How to contact the [facility] Attending or Consulting provider 7A - 7P or covering provider during after hours Ed[place], for this patient?  Check the care team in [facility] and look for a) attending/consulting TRH provider listed and b) the [facility] team listed Log into [email or web address] and use [place]'s universal password to access. If you do not have the password, please contact the hospital operator. Locate the [facility] provider you are looking for under [facility] and page to a number that you can be directly reached. If you still have difficulty reaching the provider, please page the [facility] (Director on Call) for the Hospitalists listed on amion for assistance.

## 2020-08-12 NOTE — TOC Progression Note (Signed)
Transition of Care [facility]) - Progression Note    Patient Details  Name: Lance Smith MRN: [id number] Date of Birth: 1931-03-12  Transition of Care [facility]) CM/SW Contact  Milinda Antis, [place] Phone Number: 08/12/2020, 12:51 PM  Clinical Narrative:    09:51- CSW attempted to meet with patient after receiving a consult for SNF placement.  The patient was asleep when CSW arrived.  The patient did wake up while CSW was there, but was groggy and stated that he was not sure about going to a SNF.  CSW will try again at a later time.     Expected Discharge Plan: [place] (vs SNF) Barriers to Discharge: Continued Medical Work up  Expected Discharge Plan and Services Expected Discharge Plan: [place] (vs SNF)                                               Social Determinants of Health (SDOH) Interventions    Readmission Risk Interventions No flowsheet data found.

## 2020-08-13 DIAGNOSIS — S72001A Fracture of unspecified part of neck of right femur, initial encounter for closed fracture: Secondary | ICD-10-CM | POA: Diagnosis not present

## 2020-08-13 LAB — CBC
HCT: 35.5 % — ABNORMAL LOW (ref 39.0–52.0)
Hemoglobin: 12 g/dL — ABNORMAL LOW (ref 13.0–17.0)
MCH: 35.1 pg — ABNORMAL HIGH (ref 26.0–34.0)
MCHC: 33.8 g/dL (ref 30.0–36.0)
MCV: 103.8 fL — ABNORMAL HIGH (ref 80.0–100.0)
Platelets: 234 10*3/uL (ref 150–400)
RBC: 3.42 MIL/uL — ABNORMAL LOW (ref 4.22–5.81)
RDW: 13 % (ref 11.5–15.5)
WBC: 14 10*3/uL — ABNORMAL HIGH (ref 4.0–10.5)
nRBC: 0 % (ref 0.0–0.2)

## 2020-08-13 LAB — BASIC METABOLIC PANEL
Anion gap: 7 (ref 5–15)
BUN: 29 mg/dL — ABNORMAL HIGH (ref 8–23)
CO2: 31 mmol/L (ref 22–32)
Calcium: 8.3 mg/dL — ABNORMAL LOW (ref 8.9–10.3)
Chloride: 99 mmol/L (ref 98–111)
Creatinine, Ser: 1.22 mg/dL (ref 0.61–1.24)
GFR, Estimated: 57 mL/min — ABNORMAL LOW (ref >60)
Glucose, Bld: 164 mg/dL — ABNORMAL HIGH (ref 70–99)
Potassium: 4.5 mmol/L (ref 3.5–5.1)
Sodium: 137 mmol/L (ref 135–145)

## 2020-08-13 LAB — GLUCOSE, CAPILLARY
Glucose-Capillary: 150 mg/dL — ABNORMAL HIGH (ref 70–99)
Glucose-Capillary: 164 mg/dL — ABNORMAL HIGH (ref 70–99)
Glucose-Capillary: 200 mg/dL — ABNORMAL HIGH (ref 70–99)
Glucose-Capillary: 128 mg/dL — ABNORMAL HIGH (ref 70–99)

## 2020-08-13 MED ORDER — RIVAROXABAN 15 MG PO TABS
15.0000 mg | ORAL_TABLET | Freq: Every day | ORAL | Status: DC
  Administered 2020-08-14 – 2020-08-16 (×3): 15 mg via ORAL
  Filled 2020-08-13 (×3): qty 1

## 2020-08-13 NOTE — Progress Notes (Signed)
Progress Note    RITCHARD HARKLESS   [id number]  DOB: 07/04/1931  DOA: 08/06/2020     7  PCP: Burnard Bunting, MD  CC: fall at home  Hospital Course: 87 year old man PMH including atrial fibrillation, complete heart block status post pacemaker implant 2013, CAD, prior MI, diastolic CHF, recurrent DVT, on apixaban, presented with hip pain after fall.  Admitted for closed right hip fracture.  Operative intervention delayed by anticoagulation.  S/p hip replacement 7/24.  Postoperative course uncomplicated.  Plan for SNF.  Interval History:  No events overnight. Extremely hard of hearing but denies much hip pain. Understands plan is for rehab.   ROS: Constitutional: negative for chills and fevers, Respiratory: negative for cough and sputum, Cardiovascular: negative for chest pain, and Gastrointestinal: negative for abdominal pain  Assessment & Plan: * Closed right hip fracture ([place]) -- Status post mechanical fall at home. -- s/p surgery 7/24  -- resumed Xarelto -- Hip fracture protocol for pain management, etc. -- stable for SNF  Emphysema lung ([place]) --remains stable. Bronchodilators as needed. Not on oxygen at home.  Aortic atherosclerosis (HCC) --continue statin  Type 2 diabetes mellitus without complication (HCC) -- remains stable.  Metformin on hold during hospitalization. -- Sliding scale insulin.  DVT, lower extremity, recurrent ([place]) -- continue Xarelto   CAD (coronary artery disease),Prior IMI  -- Seen by cardiology, noted to be at high risk for surgery but cleared without further intervention or evaluation. -- Plavix resumed, continue statin.  Atrial fibrillation (HCC) -- Paced rhythm on EKG.  Stable, continue amiodarone.  Chronic diastolic CHF (congestive heart failure) (HCC) -- Appears euvolemic.  Continue torsemide. -- Last echocardiogram August 2021 with LVEF 60 to 65%.  Grade 1 diastolic dysfunction.  Complete heart block ([place]) -- Status post pacemaker  insertion 2013.  EKG shows paced rhythm.    Old records reviewed in assessment of this patient  Antimicrobials:   DVT prophylaxis: SCDs Start: 08/10/20 1539 Rivaroxaban (XARELTO) tablet 15 mg   Code Status:   Code Status: Full Code Family Communication:   Disposition Plan: Status is: Inpatient  Remains inpatient appropriate because:Unsafe d/c plan  Dispo: The patient is from: Home              Anticipated d/c is to: SNF              Patient currently is medically stable to d/c.   Difficult to place patient No      Risk of unplanned readmission score: Unplanned Admission- Pilot do not use: 20.94   Objective: Blood pressure 114/64, pulse 72, temperature 97.7 F (36.5 C), temperature source Oral, resp. rate 18, height '5\' 9"'$  (1.753 m), weight 70.7 kg, SpO2 95 %.  Examination: General appearance: alert, cooperative, no distress, and hard of hearing Head: Normocephalic, without obvious abnormality, atraumatic Eyes:  EOMI Lungs: clear to auscultation bilaterally Heart: regular rate and rhythm and S1, S2 normal Abdomen: normal findings: bowel sounds normal and soft, non-tender Extremities:  no edema; compartments soft Skin: mobility and turgor normal Neurologic: Grossly normal  Consultants:  Ortho surgery  Procedures:    Data Reviewed: I have personally reviewed following labs and imaging studies Results for orders placed or performed during the hospital encounter of 08/06/20 (from the past 24 hour(s))  Glucose, capillary     Status: Abnormal   Collection Time: 08/12/20  4:40 PM  Result Value Ref Range   Glucose-Capillary 267 (H) 70 - 99 mg/dL  Glucose, capillary  Status: Abnormal   Collection Time: 08/12/20  9:02 PM  Result Value Ref Range   Glucose-Capillary 116 (H) 70 - 99 mg/dL  CBC     Status: Abnormal   Collection Time: 08/13/20  1:46 AM  Result Value Ref Range   WBC 14.0 (H) 4.0 - 10.5 K/uL   RBC 3.42 (L) 4.22 - 5.81 MIL/uL   Hemoglobin 12.0 (L)  13.0 - 17.0 g/dL   HCT 35.5 (L) 39.0 - 52.0 %   MCV 103.8 (H) 80.0 - 100.0 fL   MCH 35.1 (H) 26.0 - 34.0 pg   MCHC 33.8 30.0 - 36.0 g/dL   RDW 13.0 11.5 - 15.5 %   Platelets 234 150 - 400 K/uL   nRBC 0.0 0.0 - 0.2 %  Basic metabolic panel     Status: Abnormal   Collection Time: 08/13/20  1:46 AM  Result Value Ref Range   Sodium 137 135 - 145 mmol/L   Potassium 4.5 3.5 - 5.1 mmol/L   Chloride 99 98 - 111 mmol/L   CO2 31 22 - 32 mmol/L   Glucose, Bld 164 (H) 70 - 99 mg/dL   BUN 29 (H) 8 - 23 mg/dL   Creatinine, Ser 1.22 0.61 - 1.24 mg/dL   Calcium 8.3 (L) 8.9 - 10.3 mg/dL   GFR, Estimated 57 (L) >60 mL/min   Anion gap 7 5 - 15  Glucose, capillary     Status: Abnormal   Collection Time: 08/13/20  6:11 AM  Result Value Ref Range   Glucose-Capillary 150 (H) 70 - 99 mg/dL  Glucose, capillary     Status: Abnormal   Collection Time: 08/13/20 11:21 AM  Result Value Ref Range   Glucose-Capillary 164 (H) 70 - 99 mg/dL    Recent Results (from the past 240 hour(s))  Resp Panel by RT-PCR (Flu A&B, Covid) Nasopharyngeal Swab     Status: None   Collection Time: 08/06/20  2:04 PM   Specimen: Nasopharyngeal Swab; Nasopharyngeal(NP) swabs in vial transport medium  Result Value Ref Range Status   SARS Coronavirus 2 by RT PCR NEGATIVE NEGATIVE Final    Comment: (NOTE) SARS-CoV-2 target nucleic acids are NOT DETECTED.  The SARS-CoV-2 RNA is generally detectable in upper respiratory specimens during the acute phase of infection. The lowest concentration of SARS-CoV-2 viral copies this assay can detect is 138 copies/mL. A negative result does not preclude SARS-Cov-2 infection and should not be used as the sole basis for treatment or other patient management decisions. A negative result may occur with  improper specimen collection/handling, submission of specimen other than nasopharyngeal swab, presence of viral mutation(s) within the areas targeted by this assay, and inadequate number of  viral copies(<138 copies/mL). A negative result must be combined with clinical observations, patient history, and epidemiological information. The expected result is Negative.  Fact Sheet for Patients:  EntrepreneurPulse.com.au  Fact Sheet for Healthcare Providers:  IncredibleEmployment.be  This test is no t yet approved or cleared by the [place] FDA and  has been authorized for detection and/or diagnosis of SARS-CoV-2 by FDA under an Emergency Use Authorization (EUA). This EUA will remain  in effect (meaning this test can be used) for the duration of the COVID-19 declaration under Section 564(b)(1) of the Act, 21 U.S.C.section 360bbb-3(b)(1), unless the authorization is terminated  or revoked sooner.       Influenza A by PCR NEGATIVE NEGATIVE Final   Influenza B by PCR NEGATIVE NEGATIVE Final    Comment: (NOTE) The  Xpert Xpress SARS-CoV-2/FLU/RSV plus assay is intended as an aid in the diagnosis of influenza from Nasopharyngeal swab specimens and should not be used as a sole basis for treatment. Nasal washings and aspirates are unacceptable for Xpert Xpress SARS-CoV-2/FLU/RSV testing.  Fact Sheet for Patients: EntrepreneurPulse.com.au  Fact Sheet for Healthcare Providers: IncredibleEmployment.be  This test is not yet approved or cleared by the [place] FDA and has been authorized for detection and/or diagnosis of SARS-CoV-2 by FDA under an Emergency Use Authorization (EUA). This EUA will remain in effect (meaning this test can be used) for the duration of the COVID-19 declaration under Section 564(b)(1) of the Act, 21 U.S.C. section 360bbb-3(b)(1), unless the authorization is terminated or revoked.  Performed at [facility], [place] [address] [place], [place] [address]   Surgical pcr screen     Status: None   Collection Time: 08/07/20 11:28 PM   Specimen: Nasal Mucosa; Nasal Swab   Result Value Ref Range Status   MRSA, PCR NEGATIVE NEGATIVE Final   Staphylococcus aureus NEGATIVE NEGATIVE Final    Comment: (NOTE) The Xpert SA Assay (FDA approved for NASAL specimens in patients 44 years of age and older), is one component of a comprehensive surveillance program. It is not intended to diagnose infection nor to guide or monitor treatment. Performed at [facility], [place] [address], [place], [place] [address]      Radiology Studies: No results found. Pelvis Portable  Final Result    DG HIP OPERATIVE UNILAT WITH PELVIS RIGHT  Final Result    DG C-Arm 1-60 Min  Final Result    DG Ankle Complete Right  Final Result    CT HIP RIGHT WO CONTRAST  Final Result    DG Hip Unilat W or Wo Pelvis 2-3 Views Right  Final Result      Scheduled Meds:  amiodarone  200 mg Oral Daily   atorvastatin  20 mg Oral Daily   clopidogrel  75 mg Oral Daily   docusate sodium  100 mg Oral BID   feeding supplement (GLUCERNA SHAKE)  237 mL Oral TID BM   insulin aspart  0-9 Units Subcutaneous TID WC   nicotine  21 mg Transdermal Daily   polyethylene glycol  17 g Oral BID   [START ON 08/14/2020] rivaroxaban  15 mg Oral Daily   senna  1 tablet Oral BID   tamsulosin  0.4 mg Oral Daily   torsemide  40 mg Oral QODAY   triamcinolone cream  1 application Topical BID   PRN Meds: acetaminophen, albuterol, HYDROcodone-acetaminophen, menthol-cetylpyridinium **OR** phenol, nitroGLYCERIN, ondansetron **OR** ondansetron (ZOFRAN) IV Continuous Infusions:   LOS: 7 days  Time spent: Greater than 50% of the 35 minute visit was spent in counseling/coordination of care for the patient as laid out in the A&P.   Dwyane Dee, MD [facility] 08/13/2020, 1:14 PM

## 2020-08-13 NOTE — Progress Notes (Signed)
Physical Therapy Treatment Patient Details Name: Lance Smith MRN: [id number] DOB: [birth year] Today's Date: 08/13/2020    History of Present Illness 87 yo male presents to [facility] on 7/25 with fall at home, sustained R hip fx. s/p R DA-THA on 7/24. PMH includes OA, CKD III, complete heart block s/p packemaker, COPD, DM, HTN, HLD.    PT Comments    Pt resting upon arrival to room, reports continued moderate R hip pain but agreeable to OOB mobility. Pt ambulatory for increased distance today, still requires min-mod assist and frequent safety cues during mobility. PT administered and reviewed THA HEP, pt reports he has been doing "his exercises" all day. PT to continue to follow acutely.     Follow Up Recommendations  SNF     Equipment Recommendations  Other (comment) (TBD by next venue)    Recommendations for Other Services       Precautions / Restrictions Precautions Precautions: Fall Restrictions Weight Bearing Restrictions: No RLE Weight Bearing: Weight bearing as tolerated    Mobility  Bed Mobility Overal bed mobility: Needs Assistance Bed Mobility: Supine to Sit;Sit to Supine     Supine to sit: Mod assist Sit to supine: Mod assist   General bed mobility comments: mod assist for RLE and trunk management, scooting to/from EOB. Pt able to boost self up in bed with HOB flat and bed pads    Transfers Overall transfer level: Needs assistance Equipment used: Rolling walker (2 wheeled) Transfers: Sit to/from Stand Sit to Stand: Min assist         General transfer comment: min assist for rise and steady, elevated bed height required and does not follow cues for correct hand placement.  Ambulation/Gait Ambulation/Gait assistance: Min assist Gait Distance (Feet): 50 Feet Assistive device: Rolling walker (2 wheeled) Gait Pattern/deviations: Step-through pattern;Decreased stride length;Trunk flexed;Antalgic Gait velocity: decr   General Gait Details: min assist to  steady, guide pt/RW. slowed, antalgic gait with x1 RLE buckle   Stairs             Wheelchair Mobility    Modified Rankin (Stroke Patients Only)       Balance Overall balance assessment: Needs assistance;History of Falls Sitting-balance support: No upper extremity supported;Feet supported Sitting balance-Leahy Scale: Fair     Standing balance support: Bilateral upper extremity supported;During functional activity Standing balance-Leahy Scale: Poor Standing balance comment: reliant on external support                            Cognition Arousal/Alertness: Awake/alert Behavior During Therapy: WFL for tasks assessed/performed Overall Cognitive Status: Within Functional Limits for tasks assessed                                 General Comments: very HOH, requires PT to repeat questions      Exercises Total Joint Exercises Ankle Circles/Pumps: AROM;Both;10 reps;Supine    General Comments General comments (skin integrity, edema, etc.): Pt refused O2 during gait, PT donned O2 at end of session      Pertinent Vitals/Pain Pain Assessment: Faces Faces Pain Scale: Hurts little more Pain Location: R hip Pain Descriptors / Indicators: Sore;Discomfort;Grimacing Pain Intervention(s): Limited activity within patient's tolerance;Monitored during session;Repositioned    Home Living                      Prior Function  PT Goals (current goals can now be found in the care plan section) Acute Rehab PT Goals Patient Stated Goal: get better so I can help my family PT Goal Formulation: With patient Time For Goal Achievement: 08/25/20 Potential to Achieve Goals: Good Progress towards PT goals: Progressing toward goals    Frequency    Min 3X/week      PT Plan Current plan remains appropriate    Co-evaluation              AM-PAC PT "6 Clicks" Mobility   Outcome Measure  Help needed turning from your back to your  side while in a flat bed without using bedrails?: A Little Help needed moving from lying on your back to sitting on the side of a flat bed without using bedrails?: A Lot Help needed moving to and from a bed to a chair (including a wheelchair)?: A Lot Help needed standing up from a chair using your arms (e.g., wheelchair or bedside chair)?: A Little Help needed to walk in hospital room?: A Little Help needed climbing 3-5 steps with a railing? : A Lot 6 Click Score: 15    End of Session Equipment Utilized During Treatment: Oxygen;Gait belt Activity Tolerance: Patient tolerated treatment well;Patient limited by fatigue Patient left: with call bell/phone within reach;in bed;with bed alarm set Nurse Communication: Mobility status PT Visit Diagnosis: Other abnormalities of gait and mobility (R26.89);Difficulty in walking, not elsewhere classified (R26.2);Pain Pain - Right/Left: Right Pain - part of body: Hip     Time: [id number] PT Time Calculation (min) (ACUTE ONLY): 26 min  Charges:  $Gait Training: 8-22 mins                    Stacie Glaze, PT DPT Acute Rehabilitation Services Pager [phone]  Office [phone]    Roxine Caddy E Ruffin Pyo 08/13/2020, 4:50 PM

## 2020-08-13 NOTE — NC FL2 (Signed)
[place] LEVEL OF CARE SCREENING TOOL     IDENTIFICATION  Patient Name: Lance Smith Birthdate: 01-30-1931 Sex: male Admission Date (Current Location): 08/06/2020  [facility] and [place] Number:  Herbalist and Address:  [facility] [place]. [facility], [place] [address] [place], [place] [address]      Provider Number: [id number]  Attending Physician Name and Address:  Dwyane Dee, MD  Relative Name and Phone Number:       Current Level of Care: SNF Recommended Level of Care: [place] Prior Approval Number:    Date Approved/Denied:   PASRR Number: Pending  Discharge Plan: SNF    Current Diagnoses: Patient Active Problem List   Diagnosis Date Noted   Aortic atherosclerosis ([place]) 08/07/2020   Emphysema lung ([place]) 08/07/2020   Closed right hip fracture ([place]) 08/06/2020   DVT, lower extremity, recurrent ([place]) 08/06/2020   Rash 08/06/2020   Leukocytosis 08/06/2020   Type 2 diabetes mellitus without complication ([place]) Q000111Q   CAD (coronary artery disease),Prior IMI  08/22/2013   Inferior myocardial infarction ([place]) 08/22/2013   Hx of adenomatous colonic polyps 07/16/2013   Atrial fibrillation ([place]) 06/02/2012   Chronic diastolic CHF (congestive heart failure) ([place]) 06/29/2011   Hyperglycemia 05/24/2011   Urinary retention 05/21/2011   Steroid dependence ([place]) 05/21/2011   Pacemaker-St.Jude 05/21/2011   Complete heart block ([place]) 05/20/2011   Tobacco abuse 05/20/2011    Orientation RESPIRATION BLADDER Height & Weight     Self, Time, Situation, Place (Very HOH)  O2 (2L/MIN [place]) Continent Weight: 70.7 kg Height:  '5\' 9"'$  (175.3 cm)  BEHAVIORAL SYMPTOMS/MOOD NEUROLOGICAL BOWEL NUTRITION STATUS      Continent Diet (Refer to d/c summary)  AMBULATORY STATUS COMMUNICATION OF NEEDS Skin   Extensive Assist Verbally Surgical wounds (S/p hip replacement 7/24)                       Personal Care Assistance Level of  Assistance  Bathing, Feeding, Dressing Bathing Assistance: Maximum assistance Feeding assistance: Independent Dressing Assistance: Maximum assistance     Functional Limitations Info  Sight, Hearing, Speech Sight Info: Adequate Hearing Info: Impaired Speech Info: Adequate    SPECIAL CARE FACTORS FREQUENCY  PT (By licensed PT), OT (By licensed OT)     PT Frequency: 5x/week, evaluate and treat OT Frequency: 5x/week, evaluate and treat            Contractures Contractures Info: Not present    Additional Factors Info  Code Status, Allergies Code Status Info: full code Allergies Info: : Tetanus Toxoids, Aspirin, Penicillins           Current Medications (08/13/2020):  This is the current hospital active medication list Current Facility-Administered Medications  Medication Dose Route Frequency Provider Last Rate Last Admin   acetaminophen (TYLENOL) tablet 650 mg  650 mg Oral Q6H PRN Samuella Cota, MD       albuterol (PROVENTIL) (2.5 MG/3ML) 0.083% nebulizer solution 2.5 mg  2.5 mg Nebulization Q6H PRN Swinteck, Aaron Edelman, MD       amiodarone (PACERONE) tablet 200 mg  200 mg Oral Daily Swinteck, Aaron Edelman, MD   200 mg at 08/12/20 0902   atorvastatin (LIPITOR) tablet 20 mg  20 mg Oral Daily Rod Can, MD   20 mg at 08/12/20 0902   clopidogrel (PLAVIX) tablet 75 mg  75 mg Oral Daily Samuella Cota, MD   75 mg at 08/12/20 0901   docusate sodium (COLACE)  capsule 100 mg  100 mg Oral BID Rod Can, MD   100 mg at 08/12/20 2240   feeding supplement (GLUCERNA SHAKE) (GLUCERNA SHAKE) liquid 237 mL  237 mL Oral TID BM Rod Can, MD   237 mL at 08/12/20 2241   HYDROcodone-acetaminophen (NORCO/VICODIN) 5-325 MG per tablet 1-2 tablet  1-2 tablet Oral Q6H PRN Samuella Cota, MD   2 tablet at 08/12/20 2239   insulin aspart (novoLOG) injection 0-9 Units  0-9 Units Subcutaneous TID WC Rod Can, MD   5 Units at 08/12/20 1709   menthol-cetylpyridinium (CEPACOL) lozenge  3 mg  1 lozenge Oral PRN Swinteck, Aaron Edelman, MD       Or   phenol (CHLORASEPTIC) mouth spray 1 spray  1 spray Mouth/Throat PRN Swinteck, Aaron Edelman, MD       nicotine (NICODERM CQ - dosed in mg/24 hours) patch 21 mg  21 mg Transdermal Daily Swinteck, Aaron Edelman, MD   21 mg at 08/11/20 0959   nitroGLYCERIN (NITROSTAT) SL tablet 0.4 mg  0.4 mg Sublingual Q5 min PRN Rod Can, MD       ondansetron [facility]) tablet 4 mg  4 mg Oral Q6H PRN Swinteck, Aaron Edelman, MD       Or   ondansetron (ZOFRAN) injection 4 mg  4 mg Intravenous Q6H PRN Swinteck, Aaron Edelman, MD       polyethylene glycol (MIRALAX / GLYCOLAX) packet 17 g  17 g Oral BID Rod Can, MD   17 g at 08/12/20 2240   [START ON 08/14/2020] Rivaroxaban (XARELTO) tablet 15 mg  15 mg Oral Daily Karren Cobble, RPH       senna (SENOKOT) tablet 8.6 mg  1 tablet Oral BID Rod Can, MD   8.6 mg at 08/12/20 2239   tamsulosin (FLOMAX) capsule 0.4 mg  0.4 mg Oral Daily Swinteck, Aaron Edelman, MD   0.4 mg at 08/12/20 0902   torsemide (DEMADEX) tablet 40 mg  40 mg Oral Leola Brazil, MD   40 mg at 08/12/20 [id number]   triamcinolone cream (KENALOG) 0.1 % cream 1 application  1 application Topical BID Rod Can, MD   1 application at [id number]     Discharge Medications: Please see discharge summary for a list of discharge medications.  Relevant Imaging Results:  Relevant Lab Results:   Additional Information SS# [id number]  Sharin Mons, RN

## 2020-08-13 NOTE — TOC Initial Note (Signed)
Transition of Care [facility]) - Initial/Assessment Note    Patient Details  Name: Lance Smith MRN: [id number] Date of Birth: 1931-09-01  Transition of Care [facility]) CM/SW Contact:    Sharin Mons, RN Phone Number: 08/13/2020, 10:27 AM  Clinical Narrative:         S/p hip replacement 7/24 . From home with wife . PTA independent with ADL's, no DME usage.  RNCM received consult for possible SNF placement at time of discharge. RNCM spoke with patient regarding PT recommendation of SNF placement at time of discharge. Patient reported that patient's spouse  Lance Smith  is currently unable to care for patient at their home given patient's current physical needs and fall risk.  Wife walks with a walker and cares for handicap son.Patient expressed understanding of PT recommendation and is agreeable to SNF placement at time of discharge. Patient reports preference for  [facility] . RNCM discussed insurance authorization process and provided Medicare SNF ratings list. Patient expressed being hopeful for rehab and to feel better soon. No further questions reported at this time. RNCM to continue to follow and assist with discharge planning needs.   Pt fully COVID vaccinated , no booster.  Expected Discharge Plan: Skilled Nursing Facility Barriers to Discharge: Continued Medical Work up   Patient Goals and CMS Choice   CMS Medicare.gov Compare Post Acute Care list provided to:: Patient Choice offered to / list presented to : Patient  Expected Discharge Plan and Services Expected Discharge Plan: [place]                                              Prior Living Arrangements/Services     Patient language and need for interpreter reviewed:: Yes Do you feel safe going back to the place where you live?: Yes      Need for Family Participation in Patient Care: Yes (Comment) Care giver support system in place?: Yes (comment)   Criminal Activity/Legal Involvement Pertinent to  Current Situation/Hospitalization: No - Comment as needed  Activities of Daily Living      Permission Sought/Granted   Permission granted to share information with : Yes, Verbal Permission Granted  Share Information with NAME: Lance Smith (Spouse)  [phone]           Emotional Assessment Appearance:: Appears stated age Attitude/Demeanor/Rapport: Engaged Affect (typically observed): Accepting Orientation: : Oriented to Self, Oriented to Place, Oriented to  Time, Oriented to Situation [facility]) Alcohol / Substance Use: Tobacco Use (Smokes a pack /day. Declines resources to stop smoking) Psych Involvement: No (comment)  Admission diagnosis:  Ankle pain [M25.579] Closed right hip fracture (HCC) [S72.001A] Closed subcapital fracture of right femur, initial encounter ([place]) [S72.011A] Patient Active Problem List   Diagnosis Date Noted   Aortic atherosclerosis ([place]) 08/07/2020   Emphysema lung ([place]) 08/07/2020   Closed right hip fracture ([place]) 08/06/2020   DVT, lower extremity, recurrent ([place]) 08/06/2020   Rash 08/06/2020   Leukocytosis 08/06/2020   Type 2 diabetes mellitus without complication ([place]) Q000111Q   CAD (coronary artery disease),Prior IMI  08/22/2013   Inferior myocardial infarction ([place]) 08/22/2013   Hx of adenomatous colonic polyps 07/16/2013   Atrial fibrillation ([place]) 06/02/2012   Chronic diastolic CHF (congestive heart failure) ([place]) 06/29/2011   Hyperglycemia 05/24/2011   Urinary retention 05/21/2011   Steroid dependence ([place]) 05/21/2011   Pacemaker-St.Jude 05/21/2011  Complete heart block ([place]) 05/20/2011   Tobacco abuse 05/20/2011   PCP:  Burnard Bunting, MD Pharmacy:   [facility] [address] [place]), [place] - [id number] [place] [address] Phone: [phone] Fax: [phone]     Social Determinants of Health (SDOH) Interventions    Readmission Risk Interventions No flowsheet data found.

## 2020-08-14 DIAGNOSIS — S72001D Fracture of unspecified part of neck of right femur, subsequent encounter for closed fracture with routine healing: Secondary | ICD-10-CM | POA: Diagnosis not present

## 2020-08-14 DIAGNOSIS — I4891 Unspecified atrial fibrillation: Secondary | ICD-10-CM | POA: Diagnosis not present

## 2020-08-14 LAB — CBC
HCT: 36.6 % — ABNORMAL LOW (ref 39.0–52.0)
Hemoglobin: 11.9 g/dL — ABNORMAL LOW (ref 13.0–17.0)
MCH: 33.8 pg (ref 26.0–34.0)
MCHC: 32.5 g/dL (ref 30.0–36.0)
MCV: 104 fL — ABNORMAL HIGH (ref 80.0–100.0)
Platelets: 241 10*3/uL (ref 150–400)
RBC: 3.52 MIL/uL — ABNORMAL LOW (ref 4.22–5.81)
RDW: 12.8 % (ref 11.5–15.5)
WBC: 12.1 10*3/uL — ABNORMAL HIGH (ref 4.0–10.5)
nRBC: 0 % (ref 0.0–0.2)

## 2020-08-14 LAB — GLUCOSE, CAPILLARY
Glucose-Capillary: 139 mg/dL — ABNORMAL HIGH (ref 70–99)
Glucose-Capillary: 164 mg/dL — ABNORMAL HIGH (ref 70–99)
Glucose-Capillary: 161 mg/dL — ABNORMAL HIGH (ref 70–99)
Glucose-Capillary: 235 mg/dL — ABNORMAL HIGH (ref 70–99)

## 2020-08-14 LAB — BASIC METABOLIC PANEL
Anion gap: 7 (ref 5–15)
BUN: 21 mg/dL (ref 8–23)
CO2: 27 mmol/L (ref 22–32)
Calcium: 8.5 mg/dL — ABNORMAL LOW (ref 8.9–10.3)
Chloride: 100 mmol/L (ref 98–111)
Creatinine, Ser: 1.05 mg/dL (ref 0.61–1.24)
GFR, Estimated: >60 mL/min (ref >60)
Glucose, Bld: 147 mg/dL — ABNORMAL HIGH (ref 70–99)
Potassium: 4.4 mmol/L (ref 3.5–5.1)
Sodium: 134 mmol/L — ABNORMAL LOW (ref 135–145)

## 2020-08-14 MED ORDER — POLYETHYLENE GLYCOL 3350 17 G PO PACK: 17.0000 g | PACK | Freq: Two times a day (BID) | ORAL | 0 refills | Status: DC

## 2020-08-14 MED ORDER — AMIODARONE HCL 200 MG PO TABS: 200.0000 mg | ORAL_TABLET | Freq: Every day | ORAL | 3 refills | Status: AC

## 2020-08-14 MED ORDER — DOCUSATE SODIUM 100 MG PO CAPS: 100.0000 mg | ORAL_CAPSULE | Freq: Two times a day (BID) | ORAL | 0 refills | Status: DC

## 2020-08-14 MED ORDER — ATORVASTATIN CALCIUM 20 MG PO TABS: 20.0000 mg | ORAL_TABLET | Freq: Every day | ORAL | Status: DC

## 2020-08-14 MED ORDER — NICOTINE 21 MG/24HR TD PT24: 21.0000 mg | MEDICATED_PATCH | Freq: Every day | TRANSDERMAL | 0 refills | Status: AC

## 2020-08-14 MED ORDER — TORSEMIDE 20 MG PO TABS: 40.0000 mg | ORAL_TABLET | ORAL | Status: AC

## 2020-08-14 MED ORDER — SENNA 8.6 MG PO TABS
1.0000 | ORAL_TABLET | Freq: Two times a day (BID) | ORAL | 0 refills | Status: DC
Start: 2020-08-14 — End: 2021-01-15

## 2020-08-14 MED ORDER — GLUCERNA SHAKE PO LIQD: 237.0000 mL | Freq: Three times a day (TID) | ORAL | 0 refills | Status: AC

## 2020-08-14 NOTE — Progress Notes (Signed)
Nutrition Follow-up  DOCUMENTATION CODES:   Not applicable  INTERVENTION:   -Continue Glucerna Shake po TID, each supplement provides 220 kcal and 10 grams of protein  -Continue MVI with minerals daily  NUTRITION DIAGNOSIS:   Increased nutrient needs related to post-op healing as evidenced by estimated needs.  Ongoing  GOAL:   Patient will meet greater than or equal to 90% of their needs  Progressing   MONITOR:   PO intake, Supplement acceptance, Labs, Weight trends, Skin, I & O's  REASON FOR ASSESSMENT:   Consult Assessment of nutrition requirement/status, Hip fracture protocol  ASSESSMENT:   Lance Smith is a 87 y.o. male with medical history significant of atrial fibrillation, complete heart block s/p pacemaker implant in 2013, atrial fibrillation , CAD with prior MI, diastotlic heart failure and recurrent DVT on eliquis and DM2 who presented for hip pain after a fall.  He was bending forward to pick something up and kept going. He put out his arm and did not hit his head. He had immediate pain in his right hip. Rated pain as 10/10 and "real bad." No radiation of pain. He was able to get up and walk to a few steps to a chair and then unable to bear weight on his right leg. Denies any syncope, chest pain, palpitations, fever/chills, shortness of breath, cough or leg swelling. Does complain of some ankle pain on the right.  7/24- s/p PROCEDURE: Right total hip arthroplasty, anterior approach  Reviewed I/O's: -110 ml x 24 hours and -2.8 L since admission  UOP: 1.2 L x 24 hours  Pt sleeping soundly at time of visit. RD did not disturb.   Pt with good appetite. He is consuming 50-100%. Pt is consuming Glucerna supplements.    Per [facility] notes, pt awatiing SNF placement.   Medications reviewed and include miralax, demadex, and senokot.  Labs reviewed: Na: 134, CBGS: 128-164 (inpatient orders for glycemic control are 0-9 units insulin aspart TID with meals).    Diet  Order:   Diet Order             Diet Carb Modified Fluid consistency: Thin; Room service appropriate? Yes  Diet effective now                   EDUCATION NEEDS:   Education needs have been addressed  Skin:  Skin Assessment: Reviewed RN Assessment  Last BM:  08/05/20  Height:   Ht Readings from Last 1 Encounters:  08/06/20 '5\' 9"'$  (1.753 m)    Weight:   Wt Readings from Last 1 Encounters:  08/13/20 70.7 kg    Ideal Body Weight:  72.7 kg  BMI:  Body mass index is 23.02 kg/m.  Estimated Nutritional Needs:   Kcal:  1850-2050  Protein:  90-105 grams  Fluid:  > 1.8 L    Loistine Chance, RD, LDN, [place] Registered Dietitian II Certified Diabetes Care and Education Specialist Please refer to [facility] for RD and/or RD on-call/weekend/after hours pager

## 2020-08-14 NOTE — TOC Progression Note (Addendum)
Transition of Care [facility]) - Progression Note    Patient Details  Name: Lance Smith MRN: [id number] Date of Birth: 1931-03-06  [facility]) CM/SW Contact  Milinda Antis, [place] Phone Number: 08/14/2020, 3:52 PM  Clinical Narrative:    RNCM reports that [name] may be able to accept the patient.  TOC is still waiting for confirmation.    17:18-  [facility] has accepted.  Insurance auth started.  Reference number [id number]   Expected Discharge Plan: Skilled Nursing Facility Barriers to Discharge: Continued Medical Work up  Expected Discharge Plan and Services Expected Discharge Plan: [place]                                               Social Determinants of Health (SDOH) Interventions    Readmission Risk Interventions No flowsheet data found.

## 2020-08-14 NOTE — Discharge Summary (Signed)
Physician Discharge Summary  Lance Smith [id number] DOB: 29-Jan-1931 DOA: 08/06/2020  PCP: Burnard Bunting, MD  Admit date: 08/06/2020 Discharge date: 08/15/2020  Admitted From: Home Disposition:  SNF  Recommendations for Outpatient Follow-up:  Follow up with PCP in 1-2 weeks Please obtain BMP/CBC in one week Please follow up with orthopedics as recommended.   Discharge Condition:stable.  CODE STATUS:full code.  Diet recommendation: Heart Healthy   Brief/Interim Summary:  87 year old man PMH including atrial fibrillation, complete heart block status post pacemaker implant 2013, CAD, prior MI, diastolic CHF, recurrent DVT, on apixaban, presented with hip pain after fall.  Admitted for closed right hip fracture.  Operative intervention delayed by anticoagulation.  S/p hip replacement 7/24.  Postoperative course uncomplicated.  Plan for SNF. Discharge Diagnoses:  Principal Problem:   Closed right hip fracture (HCC) Active Problems:   Complete heart block (HCC)   Tobacco abuse   Chronic diastolic CHF (congestive heart failure) (HCC)   Atrial fibrillation (HCC)   CAD (coronary artery disease),Prior IMI    DVT, lower extremity, recurrent (HCC)   Rash   Leukocytosis   Type 2 diabetes mellitus without complication (HCC)   Aortic atherosclerosis (HCC)   Emphysema lung (HCC)  Assessment & Plan: * Closed right hip fracture ([place]) -- Status post mechanical fall at home. -- s/p surgery 7/24  -- resumed Xarelto -- Hip fracture protocol for pain management, etc. -- stable for SNF   Emphysema lung ([place]) --remains stable. Bronchodilators as needed. Not on oxygen at home.   Aortic atherosclerosis (HCC) --continue statin   Type 2 diabetes mellitus without complication (HCC) -- remains stable.  Metformin to be resumed on discharge.    DVT, lower extremity, recurrent ([place]) -- continue Xarelto    CAD (coronary artery disease),Prior IMI  -- Seen by cardiology, noted to be at high  risk for surgery but cleared without further intervention or evaluation. -- Plavix resumed, continue statin.   Atrial fibrillation (HCC) -- Paced rhythm on EKG.  Stable, continue amiodarone. Resume xarelto and palvix.    Chronic diastolic CHF (congestive heart failure) (HCC) -- Appears euvolemic.  Continue torsemide. -- Last echocardiogram August 2021 with LVEF 60 to 65%.  Grade 1 diastolic dysfunction.   Complete heart block ([facility]) -- Status post pacemaker insertion 2013.  EKG shows paced rhythm.      Discharge Instructions  Discharge Instructions     Diet - low sodium heart healthy   Complete by: As directed    Discharge instructions   Complete by: As directed    Please follow up with orthopedics as recommended.   No wound care   Complete by: As directed       Allergies as of 08/14/2020       Reactions   Tetanus Toxoids Shortness Of Breath, Swelling   Aspirin Nausea Only, Other (See Comments)   headache   Penicillins Swelling   Did it involve swelling of the face/tongue/throat, SOB, or low BP? Yes Did it involve sudden or severe rash/hives, skin peeling, or any reaction on the inside of your mouth or nose? Unknown Did you need to seek medical attention at a hospital or doctor's office? Yes When did it last happen? 30 years ago If all above answers are "NO", may proceed with cephalosporin use.        Medication List     STOP taking these medications    simvastatin 40 MG tablet Commonly known as: ZOCOR       TAKE these medications  amiodarone 200 MG tablet Commonly known as: Pacerone Take 1 tablet (200 mg total) by mouth daily. Start taking on: August 15, 2020 What changed: See the new instructions.   atorvastatin 20 MG tablet Commonly known as: LIPITOR Take 1 tablet (20 mg total) by mouth daily. Start taking on: August 15, 2020   clopidogrel 75 MG tablet Commonly known as: Plavix Take 1 tablet (75 mg total) by mouth daily.   docusate sodium 100 MG  capsule Commonly known as: COLACE Take 1 capsule (100 mg total) by mouth 2 (two) times daily.   feeding supplement (GLUCERNA SHAKE) Liqd Take 237 mLs by mouth 3 (three) times daily between meals.   fluocinonide ointment 0.05 % Commonly known as: LIDEX Apply 1 application topically 2 (two) times daily.   metFORMIN 500 MG tablet Commonly known as: GLUCOPHAGE Take 500 mg by mouth 2 (two) times daily with a meal.   multivitamin with minerals Tabs tablet Take 1 tablet by mouth daily. Centrum Silver   nicotine 21 mg/24hr patch Commonly known as: NICODERM CQ - dosed in mg/24 hours Place 1 patch (21 mg total) onto the skin daily. Start taking on: August 15, 2020   nitroGLYCERIN 0.4 MG SL tablet Commonly known as: NITROSTAT Place 0.4 mg under the tongue every 5 (five) minutes as needed. For chest pain   OneTouch Verio test strip Generic drug: glucose blood 1 each by Other route See admin instructions. Once or twice daily   polyethylene glycol 17 g packet Commonly known as: MIRALAX / GLYCOLAX Take 17 g by mouth 2 (two) times daily.   predniSONE 10 MG tablet Commonly known as: DELTASONE Take 10 mg by mouth daily with breakfast.   ProAir HFA 108 (90 Base) MCG/ACT inhaler Generic drug: albuterol Inhale 2 puffs into the lungs See admin instructions. Inhale 2 puffs by mouth twice daily & every 4 hours as needed for wheezing or shortness of breath.   Rivaroxaban 15 MG Tabs tablet Commonly known as: XARELTO Take 1 tablet (15 mg total) by mouth daily.   senna 8.6 MG Tabs tablet Commonly known as: SENOKOT Take 1 tablet (8.6 mg total) by mouth 2 (two) times daily.   tamsulosin 0.4 MG Caps capsule Commonly known as: FLOMAX Take 0.4 mg by mouth daily.   torsemide 20 MG tablet Commonly known as: Demadex Take 2 tablets (40 mg total) by mouth every other day.   triamcinolone cream 0.1 % Commonly known as: KENALOG Apply 1 application topically 2 (two) times daily.         Follow-up Information     S[place], Aaron Edelman, MD. Schedule an appointment as soon as possible for a visit in 2 week(s).   Specialty: Orthopedic Surgery Why: For wound re-check, For suture removal Contact information: [address] [place] [address] [phone]         Burnard Bunting, MD. Schedule an appointment as soon as possible for a visit in 1 week(s).   Specialty: Internal Medicine Contact information: [place] [address] [phone]                Allergies  Allergen Reactions   Tetanus Toxoids Shortness Of Breath and Swelling   Aspirin Nausea Only and Other (See Comments)    headache   Penicillins Swelling    Did it involve swelling of the face/tongue/throat, SOB, or low BP? Yes Did it involve sudden or severe rash/hives, skin peeling, or any reaction on the inside of your mouth or nose?  Unknown Did you need to seek medical attention at a hospital or doctor's office? Yes When did it last happen? 30 years ago If all above answers are "NO", may proceed with cephalosporin use.     Consultations: Orthopedics.    Procedures/Studies: DG Ankle Complete Right  Result Date: 08/06/2020 CLINICAL DATA:  Fall with right ankle pain EXAM: RIGHT ANKLE - COMPLETE 3+ VIEW COMPARISON:  None. FINDINGS: Lateral soft tissue swelling. No definitive fracture or malalignment. Ankle mortise is symmetric. IMPRESSION: Soft tissue swelling.  No definite acute osseous abnormality Electronically Signed   By: Donavan Foil M.D.   On: 08/06/2020 15:31   Pelvis Portable  Result Date: 08/10/2020 CLINICAL DATA:  Status post right hip replacement. EXAM: PORTABLE PELVIS 1-2 VIEWS COMPARISON:  Operative images obtained earlier today. Right hip radiographs and CT, 08/06/2020. FINDINGS: New total right hip arthroplasty appears well seated and aligned. No acute fracture or evidence of an operative complication. Skeletal structures are diffusely demineralized.  IMPRESSION: Well-positioned total right hip arthroplasty. Electronically Signed   By: Lajean Manes M.D.   On: 08/10/2020 15:32   CT HIP RIGHT WO CONTRAST  Result Date: 08/06/2020 CLINICAL DATA:  Right hip pain after a fall while walking today. Initial encounter. EXAM: CT OF THE RIGHT HIP WITHOUT CONTRAST TECHNIQUE: Multidetector CT imaging of the right hip was performed according to the standard protocol. Multiplanar CT image reconstructions were also generated. COMPARISON:  Plain films right hip today. FINDINGS: Bones/Joint/Cartilage As seen on the comparison examination, the patient has a mildly impacted subcapital fracture of the right hip. There is also mild anterior displacement of the femoral neck. No other fracture is identified. The hip is located. No lytic or sclerotic lesion is identified. Mild appearing right hip osteoarthritis noted. Ligaments Suboptimally assessed by CT. Muscles and Tendons Appear intact. Soft tissues Prostatomegaly.  Sigmoid diverticulosis. IMPRESSION: Mildly impacted and anteriorly displaced subcapital fracture right hip. No other acute finding. Mild right hip osteoarthritis. Prostatomegaly. Sigmoid diverticulosis. Electronically Signed   By: Inge Rise M.D.   On: 08/06/2020 14:58   DG C-Arm 1-60 Min  Result Date: 08/10/2020 CLINICAL DATA:  Right hip replacement EXAM: DG C-ARM 1-60 MIN FLUOROSCOPY TIME:  Fluoroscopy Time:  8.3 seconds Number of Acquired Spot Images: 6 COMPARISON:  None. FINDINGS: Six images were obtained during the right hip replacement. Hardware is in good position by the end of the study. IMPRESSION: Right hip replacement as above. Electronically Signed   By: Dorise Bullion III M.D   On: 08/10/2020 15:42   DG HIP OPERATIVE UNILAT WITH PELVIS RIGHT  Result Date: 08/10/2020 CLINICAL DATA:  Right hip arthroplasty. FLUOROSCOPY TIME:  8.3 seconds. EXAM: OPERATIVE RIGHT HIP (WITH PELVIS IF PERFORMED) 6 VIEWS TECHNIQUE: Fluoroscopic spot image(s) were  submitted for interpretation post-operatively. COMPARISON:  August 06, 2020 FINDINGS: Six images were obtained intraoperatively during replacement of the right hip. Acetabular and femoral components are in good position based on provided images by the end of the study. IMPRESSION: Right hip replacement as above. Electronically Signed   By: Dorise Bullion III M.D   On: 08/10/2020 15:42   DG Hip Unilat W or Wo Pelvis 2-3 Views Right  Result Date: 08/06/2020 CLINICAL DATA:  [place] while walking. Unable to bear weight on the right leg. EXAM: DG HIP (WITH OR WITHOUT PELVIS) 2-3V RIGHT COMPARISON:  None. FINDINGS: There is a mildly impacted but otherwise nondisplaced subcapital fracture of the right hip. The left hip is intact. The pubic symphysis and SI  joints are intact. No pelvic fractures or bone lesions. Advanced vascular calcifications. IMPRESSION: Mildly impacted but otherwise nondisplaced subcapital fracture of the right hip. Electronically Signed   By: Marijo Sanes M.D.   On: 08/06/2020 14:01      Subjective: No new complaints.   Discharge Exam: Vitals:   08/14/20 0715 08/14/20 1424  BP: 119/62 113/69  Pulse: 89 73  Resp: 17 14  Temp: 98.4 F (36.9 C) 97.9 F (36.6 C)  SpO2: 96% 92%   Vitals:   08/13/20 1322 08/13/20 2200 08/14/20 0715 08/14/20 1424  BP: 110/69 114/61 119/62 113/69  Pulse: 74 70 89 73  Resp: '18 18 17 14  '$ Temp: 98.8 F (37.1 C) 98.2 F (36.8 C) 98.4 F (36.9 C) 97.9 F (36.6 C)  TempSrc: Oral Oral Oral Oral  SpO2: 94% 95% 96% 92%  Weight:      Height:        General: Pt is alert, awake, not in acute distress Cardiovascular: RRR, S1/S2 +, no rubs, no gallops Respiratory: CTA bilaterally, no wheezing, no rhonchi Abdominal: Soft, NT, ND, bowel sounds + Extremities: no edema, no cyanosis    The results of significant diagnostics from this hospitalization (including imaging, microbiology, ancillary and laboratory) are listed below for reference.      Microbiology: Recent Results (from the past 240 hour(s))  Resp Panel by RT-PCR (Flu A&B, Covid) Nasopharyngeal Swab     Status: None   Collection Time: 08/06/20  2:04 PM   Specimen: Nasopharyngeal Swab; Nasopharyngeal(NP) swabs in vial transport medium  Result Value Ref Range Status   SARS Coronavirus 2 by RT PCR NEGATIVE NEGATIVE Final    Comment: (NOTE) SARS-CoV-2 target nucleic acids are NOT DETECTED.  The SARS-CoV-2 RNA is generally detectable in upper respiratory specimens during the acute phase of infection. The lowest concentration of SARS-CoV-2 viral copies this assay can detect is 138 copies/mL. A negative result does not preclude SARS-Cov-2 infection and should not be used as the sole basis for treatment or other patient management decisions. A negative result may occur with  improper specimen collection/handling, submission of specimen other than nasopharyngeal swab, presence of viral mutation(s) within the areas targeted by this assay, and inadequate number of viral copies(<138 copies/mL). A negative result must be combined with clinical observations, patient history, and epidemiological information. The expected result is Negative.  Fact Sheet for Patients:  EntrepreneurPulse.com.au  Fact Sheet for Healthcare Providers:  IncredibleEmployment.be  This test is no t yet approved or cleared by the [place] FDA and  has been authorized for detection and/or diagnosis of SARS-CoV-2 by FDA under an Emergency Use Authorization (EUA). This EUA will remain  in effect (meaning this test can be used) for the duration of the COVID-19 declaration under Section 564(b)(1) of the Act, 21 U.S.C.section 360bbb-3(b)(1), unless the authorization is terminated  or revoked sooner.       Influenza A by PCR NEGATIVE NEGATIVE Final   Influenza B by PCR NEGATIVE NEGATIVE Final    Comment: (NOTE) The Xpert Xpress SARS-CoV-2/FLU/RSV plus assay is  intended as an aid in the diagnosis of influenza from Nasopharyngeal swab specimens and should not be used as a sole basis for treatment. Nasal washings and aspirates are unacceptable for Xpert Xpress SARS-CoV-2/FLU/RSV testing.  Fact Sheet for Patients: EntrepreneurPulse.com.au  Fact Sheet for Healthcare Providers: IncredibleEmployment.be  This test is not yet approved or cleared by the [place] FDA and has been authorized for detection and/or diagnosis of SARS-CoV-2 by FDA  under an Emergency Use Authorization (EUA). This EUA will remain in effect (meaning this test can be used) for the duration of the COVID-19 declaration under Section 564(b)(1) of the Act, 21 U.S.C. section 360bbb-3(b)(1), unless the authorization is terminated or revoked.  Performed at [facility], [place] [address] [place], [place] [address]   Surgical pcr screen     Status: None   Collection Time: 08/07/20 11:28 PM   Specimen: Nasal Mucosa; Nasal Swab  Result Value Ref Range Status   MRSA, PCR NEGATIVE NEGATIVE Final   Staphylococcus aureus NEGATIVE NEGATIVE Final    Comment: (NOTE) The Xpert SA Assay (FDA approved for NASAL specimens in patients 67 years of age and older), is one component of a comprehensive surveillance program. It is not intended to diagnose infection nor to guide or monitor treatment. Performed at [facility], [place] [address], [place], [place] [address]      Labs: BNP (last 3 results) Recent Labs    06/19/20 1338  BNP [id number]*   Basic Metabolic Panel: Recent Labs  Lab 08/11/20 0128 08/11/20 0751 08/12/20 0436 08/13/20 0146 08/14/20 0142  NA 132* 135 138 137 134*  K 5.7* 5.2* 5.1 4.5 4.4  CL 99 99 101 99 100  CO2 '24 29 31 31 27  '$ GLUCOSE 237* 169* 133* 164* 147*  BUN 23 26* 27* 29* 21  CREATININE 1.33* 1.28* 1.24 1.22 1.05  CALCIUM 8.4* 8.6* 8.5* 8.3* 8.5*   Liver Function Tests: No results for input(s): AST, ALT,  ALKPHOS, BILITOT, PROT, ALBUMIN in the last 168 hours. No results for input(s): LIPASE, AMYLASE in the last 168 hours. No results for input(s): AMMONIA in the last 168 hours. CBC: Recent Labs  Lab 08/10/20 0222 08/11/20 0128 08/12/20 0436 08/13/20 0146 08/14/20 0142  WBC 11.5* 14.7* 13.4* 14.0* 12.1*  HGB 12.7* 12.3* 11.4* 12.0* 11.9*  HCT 39.2 36.4* 34.9* 35.5* 36.6*  MCV 105.7* 104.0* 104.2* 103.8* 104.0*  PLT 253 244 226 234 241   Cardiac Enzymes: No results for input(s): CKTOTAL, CKMB, CKMBINDEX, TROPONINI in the last 168 hours. BNP: Invalid input(s): POCBNP CBG: Recent Labs  Lab 08/13/20 1121 08/13/20 1607 08/13/20 2046 08/14/20 0621 08/14/20 1118  GLUCAP 164* 200* 128* 139* 164*   D-Dimer No results for input(s): DDIMER in the last 72 hours. Hgb A1c No results for input(s): HGBA1C in the last 72 hours. Lipid Profile No results for input(s): CHOL, HDL, LDLCALC, TRIG, CHOLHDL, LDLDIRECT in the last 72 hours. Thyroid function studies No results for input(s): TSH, T4TOTAL, T3FREE, THYROIDAB in the last 72 hours.  Invalid input(s): FREET3 Anemia work up No results for input(s): VITAMINB12, FOLATE, FERRITIN, TIBC, IRON, RETICCTPCT in the last 72 hours. Urinalysis    Component Value Date/Time   COLORURINE YELLOW 07/13/2013 1225   APPEARANCEUR CLEAR 07/13/2013 1225   LABSPEC 1.010 07/13/2013 1225   PHURINE 6.0 07/13/2013 1225   GLUCOSEU NEGATIVE 07/13/2013 1225   HGBUR MODERATE (A) 07/13/2013 1225   BILIRUBINUR NEGATIVE 07/13/2013 1225   KETONESUR NEGATIVE 07/13/2013 1225   PROTEINUR NEGATIVE 07/13/2013 1225   UROBILINOGEN 0.2 07/13/2013 1225   NITRITE NEGATIVE 07/13/2013 1225   LEUKOCYTESUR TRACE (A) 07/13/2013 1225   Sepsis Labs Invalid input(s): PROCALCITONIN,  WBC,  LACTICIDVEN Microbiology Recent Results (from the past 240 hour(s))  Resp Panel by RT-PCR (Flu A&B, Covid) Nasopharyngeal Swab     Status: None   Collection Time: 08/06/20  2:04 PM    Specimen: Nasopharyngeal Swab; Nasopharyngeal(NP) swabs in  vial transport medium  Result Value Ref Range Status   SARS Coronavirus 2 by RT PCR NEGATIVE NEGATIVE Final    Comment: (NOTE) SARS-CoV-2 target nucleic acids are NOT DETECTED.  The SARS-CoV-2 RNA is generally detectable in upper respiratory specimens during the acute phase of infection. The lowest concentration of SARS-CoV-2 viral copies this assay can detect is 138 copies/mL. A negative result does not preclude SARS-Cov-2 infection and should not be used as the sole basis for treatment or other patient management decisions. A negative result may occur with  improper specimen collection/handling, submission of specimen other than nasopharyngeal swab, presence of viral mutation(s) within the areas targeted by this assay, and inadequate number of viral copies(<138 copies/mL). A negative result must be combined with clinical observations, patient history, and epidemiological information. The expected result is Negative.  Fact Sheet for Patients:  EntrepreneurPulse.com.au  Fact Sheet for Healthcare Providers:  IncredibleEmployment.be  This test is no t yet approved or cleared by the [place] FDA and  has been authorized for detection and/or diagnosis of SARS-CoV-2 by FDA under an Emergency Use Authorization (EUA). This EUA will remain  in effect (meaning this test can be used) for the duration of the COVID-19 declaration under Section 564(b)(1) of the Act, 21 U.S.C.section 360bbb-3(b)(1), unless the authorization is terminated  or revoked sooner.       Influenza A by PCR NEGATIVE NEGATIVE Final   Influenza B by PCR NEGATIVE NEGATIVE Final    Comment: (NOTE) The Xpert Xpress SARS-CoV-2/FLU/RSV plus assay is intended as an aid in the diagnosis of influenza from Nasopharyngeal swab specimens and should not be used as a sole basis for treatment. Nasal washings and aspirates are  unacceptable for Xpert Xpress SARS-CoV-2/FLU/RSV testing.  Fact Sheet for Patients: EntrepreneurPulse.com.au  Fact Sheet for Healthcare Providers: IncredibleEmployment.be  This test is not yet approved or cleared by the [place] FDA and has been authorized for detection and/or diagnosis of SARS-CoV-2 by FDA under an Emergency Use Authorization (EUA). This EUA will remain in effect (meaning this test can be used) for the duration of the COVID-19 declaration under Section 564(b)(1) of the Act, 21 U.S.C. section 360bbb-3(b)(1), unless the authorization is terminated or revoked.  Performed at [facility], [place] [address] [place], [place] [address]   Surgical pcr screen     Status: None   Collection Time: 08/07/20 11:28 PM   Specimen: Nasal Mucosa; Nasal Swab  Result Value Ref Range Status   MRSA, PCR NEGATIVE NEGATIVE Final   Staphylococcus aureus NEGATIVE NEGATIVE Final    Comment: (NOTE) The Xpert SA Assay (FDA approved for NASAL specimens in patients 76 years of age and older), is one component of a comprehensive surveillance program. It is not intended to diagnose infection nor to guide or monitor treatment. Performed at [facility], [place] [address], [place], [place] [address]      Time coordinating discharge: 38 minutes.   SIGNED:   Hosie Poisson, MD  [facility]

## 2020-08-15 DIAGNOSIS — S72001D Fracture of unspecified part of neck of right femur, subsequent encounter for closed fracture with routine healing: Secondary | ICD-10-CM | POA: Diagnosis not present

## 2020-08-15 DIAGNOSIS — I4891 Unspecified atrial fibrillation: Secondary | ICD-10-CM | POA: Diagnosis not present

## 2020-08-15 LAB — RESP PANEL BY RT-PCR (FLU A&B, COVID) ARPGX2
Influenza A by PCR: NEGATIVE
Influenza B by PCR: NEGATIVE
SARS Coronavirus 2 by RT PCR: NEGATIVE

## 2020-08-15 LAB — BASIC METABOLIC PANEL
Anion gap: 8 (ref 5–15)
BUN: 27 mg/dL — ABNORMAL HIGH (ref 8–23)
CO2: 30 mmol/L (ref 22–32)
Calcium: 8.5 mg/dL — ABNORMAL LOW (ref 8.9–10.3)
Chloride: 98 mmol/L (ref 98–111)
Creatinine, Ser: 1.26 mg/dL — ABNORMAL HIGH (ref 0.61–1.24)
GFR, Estimated: 55 mL/min — ABNORMAL LOW (ref >60)
Glucose, Bld: 149 mg/dL — ABNORMAL HIGH (ref 70–99)
Potassium: 4.3 mmol/L (ref 3.5–5.1)
Sodium: 136 mmol/L (ref 135–145)

## 2020-08-15 LAB — CBC
HCT: 35.7 % — ABNORMAL LOW (ref 39.0–52.0)
Hemoglobin: 11.9 g/dL — ABNORMAL LOW (ref 13.0–17.0)
MCH: 34.8 pg — ABNORMAL HIGH (ref 26.0–34.0)
MCHC: 33.3 g/dL (ref 30.0–36.0)
MCV: 104.4 fL — ABNORMAL HIGH (ref 80.0–100.0)
Platelets: 251 10*3/uL (ref 150–400)
RBC: 3.42 MIL/uL — ABNORMAL LOW (ref 4.22–5.81)
RDW: 12.6 % (ref 11.5–15.5)
WBC: 11.7 10*3/uL — ABNORMAL HIGH (ref 4.0–10.5)
nRBC: 0 % (ref 0.0–0.2)

## 2020-08-15 LAB — GLUCOSE, CAPILLARY
Glucose-Capillary: 159 mg/dL — ABNORMAL HIGH (ref 70–99)
Glucose-Capillary: 153 mg/dL — ABNORMAL HIGH (ref 70–99)
Glucose-Capillary: 189 mg/dL — ABNORMAL HIGH (ref 70–99)
Glucose-Capillary: 115 mg/dL — ABNORMAL HIGH (ref 70–99)

## 2020-08-15 MED ORDER — BISACODYL 5 MG PO TBEC
10.0000 mg | DELAYED_RELEASE_TABLET | Freq: Every day | ORAL | Status: DC | PRN
  Administered 2020-08-15: 10 mg via ORAL
  Filled 2020-08-15: qty 2

## 2020-08-15 MED ORDER — COVID-19 MRNA VACC (MODERNA) 50 MCG/0.25ML IM SUSP
0.2500 mL | Freq: Once | INTRAMUSCULAR | Status: AC
  Administered 2020-08-15: 0.25 mL via INTRAMUSCULAR
  Filled 2020-08-15: qty 0.25

## 2020-08-15 NOTE — Plan of Care (Signed)

## 2020-08-15 NOTE — Progress Notes (Signed)
Physical Therapy Treatment Patient Details Name: Lance Smith MRN: [id number] DOB: February 14, 1931 Today's Date: 08/15/2020    History of Present Illness 87 yo male [facility] on [facility] with fall at home, sustained R hip fx. s/p R DA-THA on 7/24. PMH includes OA, CKD III, complete heart block s/p packemaker, COPD, DM, HTN, HLD.    PT Comments    Pt now refusing SNF and plans to go home tomorrow. Pt moving fairly well. Requires only min guard to min assist. Instructed pt in going up/down stairs to enter home. Pt perseverating on wanting to have a BM and wanting an enema.    Follow Up Recommendations  Home health PT;Supervision for mobility/OOB (Pt refusing SNF)     Equipment Recommendations  Rolling walker with 5" wheels;3in1 (PT)    Recommendations for Other Services       Precautions / Restrictions Precautions Precautions: Fall Restrictions Weight Bearing Restrictions: No RLE Weight Bearing: Weight bearing as tolerated    Mobility  Bed Mobility               General bed mobility comments: Pt up on commode    Transfers Overall transfer level: Needs assistance Equipment used: Rolling walker (2 wheeled) Transfers: Sit to/from Stand Sit to Stand: Min assist;Min guard         General transfer comment: Min assist to bring hips up from low commode. Min guard to rise from recliner.  Ambulation/Gait Ambulation/Gait assistance: Min guard Gait Distance (Feet): 30 Feet Assistive device: Rolling walker (2 wheeled) Gait Pattern/deviations: Step-through pattern;Decreased stride length;Trunk flexed;Antalgic;Decreased stance time - right Gait velocity: decr Gait velocity interpretation: <1.31 ft/sec, indicative of household ambulator General Gait Details: Assist for safety. Pt self limiting as far as distance. Didn't want to go in hallway   Stairs Stairs: Yes Stairs assistance: Min guard Stair Management: With walker;Step to pattern;Forwards Number of Stairs: 1  (1x2) General stair comments: Up/down 1 plaform step with assist for safety   Wheelchair Mobility    Modified Rankin (Stroke Patients Only)       Balance Overall balance assessment: Needs assistance;History of Falls Sitting-balance support: No upper extremity supported;Feet supported Sitting balance-Leahy Scale: Fair     Standing balance support: Bilateral upper extremity supported;During functional activity Standing balance-Leahy Scale: Poor Standing balance comment: walker and min guard for static sitting                            Cognition Arousal/Alertness: Awake/alert Behavior During Therapy: WFL for tasks assessed/performed Overall Cognitive Status: Within Functional Limits for tasks assessed                                 General Comments: Pt with strong opinions and reluctant to listen at times      Exercises      General Comments General comments (skin integrity, edema, etc.): Pt refused O2 with mobility/ambulation. Attempted to get SpO2 reading but unable to obtain reading      Pertinent Vitals/Pain Pain Assessment: Faces Faces Pain Scale: Hurts little more Pain Location: R hip Pain Descriptors / Indicators: Sore;Grimacing Pain Intervention(s): Limited activity within patient's tolerance;Monitored during session    Home Living                      Prior Function            PT  Goals (current goals can now be found in the care plan section) Acute Rehab PT Goals Patient Stated Goal: go home Progress towards PT goals: Progressing toward goals    Frequency    Min 3X/week      PT Plan Current plan remains appropriate    Co-evaluation              AM-PAC PT "6 Clicks" Mobility   Outcome Measure  Help needed turning from your back to your side while in a flat bed without using bedrails?: A Little Help needed moving from lying on your back to sitting on the side of a flat bed without using bedrails?: A  Little Help needed moving to and from a bed to a chair (including a wheelchair)?: A Little Help needed standing up from a chair using your arms (e.g., wheelchair or bedside chair)?: A Little Help needed to walk in hospital room?: A Little Help needed climbing 3-5 steps with a railing? : A Little 6 Click Score: 18    End of Session Equipment Utilized During Treatment: Gait belt Activity Tolerance: Other (comment) (self limiting) Patient left: with call bell/phone within reach;in chair;with chair alarm set Nurse Communication: Mobility status;Other (comment) (pt requesting enema) PT Visit Diagnosis: Other abnormalities of gait and mobility (R26.89);Difficulty in walking, not elsewhere classified (R26.2);Pain Pain - Right/Left: Right Pain - part of body: Hip     Time: 1431-1455 PT Time Calculation (min) (ACUTE ONLY): 24 min  Charges:  $Gait Training: 23-37 mins                     Louisa Pager [phone] Office [place] 08/15/2020, 3:38 PM
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
## 2020-08-15 NOTE — Progress Notes (Signed)
Moderna covid vaccine given. See MAR. Patient monitored for 30 minutes after administration. No reactions noted. No complaints. Covid card given to patient, placed on the side table.

## 2020-08-15 NOTE — Progress Notes (Signed)
PROGRESS NOTE    Lance Smith  [id number] DOB: April 05, 1931 DOA: 08/06/2020 PCP: Burnard Bunting, MD    Chief Complaint  Patient presents with   Fall    Brief Narrative:   87 year old man PMH including atrial fibrillation, complete heart block status post pacemaker implant 2013, CAD, prior MI, diastolic CHF, recurrent DVT, on apixaban, presented with hip pain after fall.  Admitted for closed right hip fracture.  Operative intervention delayed by anticoagulation.  S/p hip replacement 7/24.  Postoperative course uncomplicated.  Plan for SNF. Assessment & Plan:   Principal Problem:   Closed right hip fracture (HCC) Active Problems:   Complete heart block (HCC)   Tobacco abuse   Chronic diastolic CHF (congestive heart failure) (HCC)   Atrial fibrillation (HCC)   CAD (coronary artery disease),Prior IMI    DVT, lower extremity, recurrent (HCC)   Rash   Leukocytosis   Type 2 diabetes mellitus without complication (HCC)   Aortic atherosclerosis (HCC)   Emphysema lung (HCC)  Closed right hip fracture:  - s/p mechanical fall.  S/p surgery 7/24 Resumed xarelto.  He was supposed to be discharged to SNF, but wife is refusing, wanted CIR .  Request CIR consult .  If he is not a candidate for CIR, wife wants him to be discharged in the am to home.    Emphysema:  Remains stable.    Aortic atherosclerosis.  Continue stable.    Type 2 DM without complications.  Metformin on discharge.    DVT of the lower extremity  Resume xarelto.    CAD: Continue with plavix and statin.    Atrial fibrillation Rate controlled.    Chronic diastolic CHF:  PT IS EUVOLEMIC.  Echo showed last EF of 60 to 65% with grade 1 diastolic dysfunction.    H/o CHB - S/P Pacemaker .      DVT prophylaxis: xarelto.  Code Status: Full code.  Family Communication: none at bedside. Talked to wife over the phone.  Disposition:   Status is: Inpatient  Remains inpatient appropriate  because:Unsafe d/c plan  Dispo: The patient is from: Home              Anticipated d/c is to: Home              Patient currently is medically stable to d/c.   Difficult to place patient No       Consultants:  Orthopedics.   Procedures:   Antimicrobials: (none.    Subjective: No chest pain or sob.   Objective: Vitals:   08/15/20 0500 08/15/20 0752 08/15/20 1055 08/15/20 1204  BP:  127/64 120/68 (!) 114/58  Pulse:  74 84 86  Resp:  '16 17 14  '$ Temp:  98.7 F (37.1 C) 98.1 F (36.7 C) 98.6 F (37 C)  TempSrc:  Oral Oral Oral  SpO2:  91% 92% 95%  Weight: 67.2 kg     Height:        Intake/Output Summary (Last 24 hours) at 08/15/2020 1603 Last data filed at 08/15/2020 0752 Gross per 24 hour  Intake 240 ml  Output 600 ml  Net -360 ml   Filed Weights   08/11/20 0435 08/13/20 0500 08/15/20 0500  Weight: 71.6 kg 70.7 kg 67.2 kg    Examination:  General exam: Appears calm and comfortable  Respiratory system: Clear to auscultation. Respiratory effort normal. Cardiovascular system: S1 & S2 heard, RRR. No JVD,  No pedal edema. Gastrointestinal system: Abdomen is nondistended, soft and nontender.  Central nervous system: Alert and oriented. No focal neurological deficits. Extremities: Symmetric 5 x 5 power. Skin: No rashes, lesions or ulcers Psychiatry: Mood & affect appropriate.     Data Reviewed: I have personally reviewed following labs and imaging studies  CBC: Recent Labs  Lab 08/11/20 0128 08/12/20 0436 08/13/20 0146 08/14/20 0142 08/15/20 0112  WBC 14.7* 13.4* 14.0* 12.1* 11.7*  HGB 12.3* 11.4* 12.0* 11.9* 11.9*  HCT 36.4* 34.9* 35.5* 36.6* 35.7*  MCV 104.0* 104.2* 103.8* 104.0* 104.4*  PLT 244 226 234 241 [id number]    Basic Metabolic Panel: Recent Labs  Lab 08/11/20 0751 08/12/20 0436 08/13/20 0146 08/14/20 0142 08/15/20 0112  NA 135 138 137 134* 136  K 5.2* 5.1 4.5 4.4 4.3  CL 99 101 99 100 98  CO2 '29 31 31 27 30  '$ GLUCOSE 169* 133* 164*  147* 149*  BUN 26* 27* 29* 21 27*  CREATININE 1.28* 1.24 1.22 1.05 1.26*  CALCIUM 8.6* 8.5* 8.3* 8.5* 8.5*    GFR: Estimated Creatinine Clearance: 39.3 mL/min (A) (by C-G formula based on SCr of 1.26 mg/dL (H)).  Liver Function Tests: No results for input(s): AST, ALT, ALKPHOS, BILITOT, PROT, ALBUMIN in the last 168 hours.  CBG: Recent Labs  Lab 08/14/20 1118 08/14/20 1608 08/14/20 2121 08/15/20 0657 08/15/20 1200  GLUCAP 164* 161* 235* 159* 153*     Recent Results (from the past 240 hour(s))  Resp Panel by RT-PCR (Flu A&B, Covid) Nasopharyngeal Swab     Status: None   Collection Time: 08/06/20  2:04 PM   Specimen: Nasopharyngeal Swab; Nasopharyngeal(NP) swabs in vial transport medium  Result Value Ref Range Status   SARS Coronavirus 2 by RT PCR NEGATIVE NEGATIVE Final    Comment: (NOTE) SARS-CoV-2 target nucleic acids are NOT DETECTED.  The SARS-CoV-2 RNA is generally detectable in upper respiratory specimens during the acute phase of infection. The lowest concentration of SARS-CoV-2 viral copies this assay can detect is 138 copies/mL. A negative result does not preclude SARS-Cov-2 infection and should not be used as the sole basis for treatment or other patient management decisions. A negative result may occur with  improper specimen collection/handling, submission of specimen other than nasopharyngeal swab, presence of viral mutation(s) within the areas targeted by this assay, and inadequate number of viral copies(<138 copies/mL). A negative result must be combined with clinical observations, patient history, and epidemiological information. The expected result is Negative.  Fact Sheet for Patients:  EntrepreneurPulse.com.au  Fact Sheet for Healthcare Providers:  IncredibleEmployment.be  This test is no t yet approved or cleared by the [place] FDA and  has been authorized for detection and/or diagnosis of SARS-CoV-2  by FDA under an Emergency Use Authorization (EUA). This EUA will remain  in effect (meaning this test can be used) for the duration of the COVID-19 declaration under Section 564(b)(1) of the Act, 21 U.S.C.section 360bbb-3(b)(1), unless the authorization is terminated  or revoked sooner.       Influenza A by PCR NEGATIVE NEGATIVE Final   Influenza B by PCR NEGATIVE NEGATIVE Final    Comment: (NOTE) The Xpert Xpress SARS-CoV-2/FLU/RSV plus assay is intended as an aid in the diagnosis of influenza from Nasopharyngeal swab specimens and should not be used as a sole basis for treatment. Nasal washings and aspirates are unacceptable for Xpert Xpress SARS-CoV-2/FLU/RSV testing.  Fact Sheet for Patients: EntrepreneurPulse.com.au  Fact Sheet for Healthcare Providers: IncredibleEmployment.be  This test is not yet approved or cleared by the [place] FDA  and has been authorized for detection and/or diagnosis of SARS-CoV-2 by FDA under an Emergency Use Authorization (EUA). This EUA will remain in effect (meaning this test can be used) for the duration of the COVID-19 declaration under Section 564(b)(1) of the Act, 21 U.S.C. section 360bbb-3(b)(1), unless the authorization is terminated or revoked.  Performed at [facility], [place] [address], [place], [place] [address]   Surgical pcr screen     Status: None   Collection Time: 08/07/20 11:28 PM   Specimen: Nasal Mucosa; Nasal Swab  Result Value Ref Range Status   MRSA, PCR NEGATIVE NEGATIVE Final   Staphylococcus aureus NEGATIVE NEGATIVE Final    Comment: (NOTE) The Xpert SA Assay (FDA approved for NASAL specimens in patients 82 years of age and older), is one component of a comprehensive surveillance program. It is not intended to diagnose infection nor to guide or monitor treatment. Performed at [facility], [place] [address] [place], [place] [address]   Resp Panel by RT-PCR (Flu  A&B, Covid) Nasopharyngeal Swab     Status: None   Collection Time: 08/15/20 11:05 AM   Specimen: Nasopharyngeal Swab; Nasopharyngeal(NP) swabs in vial transport medium  Result Value Ref Range Status   SARS Coronavirus 2 by RT PCR NEGATIVE NEGATIVE Final    Comment: (NOTE) SARS-CoV-2 target nucleic acids are NOT DETECTED.  The SARS-CoV-2 RNA is generally detectable in upper respiratory specimens during the acute phase of infection. The lowest concentration of SARS-CoV-2 viral copies this assay can detect is 138 copies/mL. A negative result does not preclude SARS-Cov-2 infection and should not be used as the sole basis for treatment or other patient management decisions. A negative result may occur with  improper specimen collection/handling, submission of specimen other than nasopharyngeal swab, presence of viral mutation(s) within the areas targeted by this assay, and inadequate number of viral copies(<138 copies/mL). A negative result must be combined with clinical observations, patient history, and epidemiological information. The expected result is Negative.  Fact Sheet for Patients:  EntrepreneurPulse.com.au  Fact Sheet for Healthcare Providers:  IncredibleEmployment.be  This test is no t yet approved or cleared by the [place] FDA and  has been authorized for detection and/or diagnosis of SARS-CoV-2 by FDA under an Emergency Use Authorization (EUA). This EUA will remain  in effect (meaning this test can be used) for the duration of the COVID-19 declaration under Section 564(b)(1) of the Act, 21 U.S.C.section 360bbb-3(b)(1), unless the authorization is terminated  or revoked sooner.       Influenza A by PCR NEGATIVE NEGATIVE Final   Influenza B by PCR NEGATIVE NEGATIVE Final    Comment: (NOTE) The Xpert Xpress SARS-CoV-2/FLU/RSV plus assay is intended as an aid in the diagnosis of influenza from Nasopharyngeal swab specimens  and should not be used as a sole basis for treatment. Nasal washings and aspirates are unacceptable for Xpert Xpress SARS-CoV-2/FLU/RSV testing.  Fact Sheet for Patients: EntrepreneurPulse.com.au  Fact Sheet for Healthcare Providers: IncredibleEmployment.be  This test is not yet approved or cleared by the [place] FDA and has been authorized for detection and/or diagnosis of SARS-CoV-2 by FDA under an Emergency Use Authorization (EUA). This EUA will remain in effect (meaning this test can be used) for the duration of the COVID-19 declaration under Section 564(b)(1) of the Act, 21 U.S.C. section 360bbb-3(b)(1), unless the authorization is terminated or revoked.  Performed at [facility], [place] [address] [place], [place] [address]  Radiology Studies: No results found.      Scheduled Meds:  amiodarone  200 mg Oral Daily   atorvastatin  20 mg Oral Daily   clopidogrel  75 mg Oral Daily   docusate sodium  100 mg Oral BID   feeding supplement (GLUCERNA SHAKE)  237 mL Oral TID BM   insulin aspart  0-9 Units Subcutaneous TID WC   nicotine  21 mg Transdermal Daily   polyethylene glycol  17 g Oral BID   rivaroxaban  15 mg Oral Daily   senna  1 tablet Oral BID   tamsulosin  0.4 mg Oral Daily   torsemide  40 mg Oral QODAY   triamcinolone cream  1 application Topical BID   Continuous Infusions:   LOS: 9 days        Hosie Poisson, MD [facility]   To contact the attending provider between 7A-7P or the covering provider during after hours 7P-7A, please log into the web site [email or web address] and access using universal [place] password for that web site. If you do not have the password, please call the hospital operator.  08/15/2020, 4:03 PM
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
## 2020-08-15 NOTE — Progress Notes (Signed)
Inpatient Rehabilitation Admissions Coordinator   Inpatient rehab consult received. SNF recommended but family refuses SNF placement that is offered. [employer] medicare unlikely to approve Cir admit for this diagnosis and it is not recommended by therapy. No medical need for continued intensive rehab in acute hospital setting. Other rehab venue options need to be pursued. We will not pursue Cir admit at this time.  I spoke with his wife by phone and she is aware that we are not pursuing CIR admit for he does not have the medical need. She is asking to d/c home with [facility]  in the morning. She does not want SNF. I will update acute team and TOC.  Danne Baxter, RN, MSN Rehab Admissions Coordinator [phone] 08/15/2020 1:33 PM

## 2020-08-15 NOTE — TOC Progression Note (Signed)
Transition of Care [facility]) - Progression Note    Patient Details  Name: Lance Smith MRN: [id number] Date of Birth: [birth year]  Transition of Care [facility]) CM/SW Contact  Milinda Antis, [place] Phone Number: 08/15/2020, 10:25 AM  Clinical Narrative:    10:00-  CSW called the insurance company to check on the status of the patient's insurance authorization due to nothing being uploaded in the online system.  CSW was on hold and then left a message on the automated VM requesting a returned call.  CSW informed the facility that insurance Josem Kaufmann was still pending.     Expected Discharge Plan: Skilled Nursing Facility Barriers to Discharge: Continued Medical Work up  Expected Discharge Plan and Services Expected Discharge Plan: [place]                                               Social Determinants of Health (SDOH) Interventions    Readmission Risk Interventions No flowsheet data found.

## 2020-08-16 ENCOUNTER — Inpatient Hospital Stay (HOSPITAL_COMMUNITY): Payer: Medicare Other

## 2020-08-16 ENCOUNTER — Encounter (HOSPITAL_COMMUNITY): Payer: Self-pay | Admitting: Orthopedic Surgery

## 2020-08-16 DIAGNOSIS — S72001D Fracture of unspecified part of neck of right femur, subsequent encounter for closed fracture with routine healing: Secondary | ICD-10-CM | POA: Diagnosis not present

## 2020-08-16 LAB — BASIC METABOLIC PANEL
Anion gap: 7 (ref 5–15)
BUN: 30 mg/dL — ABNORMAL HIGH (ref 8–23)
CO2: 29 mmol/L (ref 22–32)
Calcium: 8.5 mg/dL — ABNORMAL LOW (ref 8.9–10.3)
Chloride: 98 mmol/L (ref 98–111)
Creatinine, Ser: 1.16 mg/dL (ref 0.61–1.24)
GFR, Estimated: >60 mL/min (ref >60)
Glucose, Bld: 140 mg/dL — ABNORMAL HIGH (ref 70–99)
Potassium: 4.6 mmol/L (ref 3.5–5.1)
Sodium: 134 mmol/L — ABNORMAL LOW (ref 135–145)

## 2020-08-16 LAB — GLUCOSE, CAPILLARY
Glucose-Capillary: 167 mg/dL — ABNORMAL HIGH (ref 70–99)
Glucose-Capillary: 147 mg/dL — ABNORMAL HIGH (ref 70–99)

## 2020-08-16 LAB — CBC
HCT: 36.2 % — ABNORMAL LOW (ref 39.0–52.0)
Hemoglobin: 11.8 g/dL — ABNORMAL LOW (ref 13.0–17.0)
MCH: 33.6 pg (ref 26.0–34.0)
MCHC: 32.6 g/dL (ref 30.0–36.0)
MCV: 103.1 fL — ABNORMAL HIGH (ref 80.0–100.0)
Platelets: 271 10*3/uL (ref 150–400)
RBC: 3.51 MIL/uL — ABNORMAL LOW (ref 4.22–5.81)
RDW: 12.4 % (ref 11.5–15.5)
WBC: 13.9 10*3/uL — ABNORMAL HIGH (ref 4.0–10.5)
nRBC: 0 % (ref 0.0–0.2)

## 2020-08-16 MED ORDER — BISACODYL 5 MG PO TBEC: 10.0000 mg | DELAYED_RELEASE_TABLET | Freq: Every day | ORAL | 0 refills | Status: DC | PRN

## 2020-08-16 MED ORDER — IRRISEPT - 450ML BOTTLE WITH 0.05% CHG IN STERILE WATER, USP 99.95% OPTIME
TOPICAL | Status: DC | PRN
  Administered 2020-08-16: 450 mL

## 2020-08-16 NOTE — Progress Notes (Signed)
Physical Therapy Treatment Patient Details Name: Lance Smith MRN: [id number] DOB: 1931-09-21 Today's Date: 08/16/2020    History of Present Illness 87 yo male presents to [facility] on 7/25 with fall at home, sustained R hip fx. s/p R DA-THA on 7/24. PMH includes OA, CKD III, complete heart block s/p packemaker, COPD, DM, HTN, HLD.    PT Comments    Today's session focused on ambulation. Pt fatigues quickly after ambulating 20 ft and standing for ~1 min to obtain SpO2 reading. He desats on RA to 82% on RA and recovers to 92% on 2L O2. Will continue to follow for mobility progression.     Follow Up Recommendations  Home health PT;Supervision for mobility/OOB (Pt refusing SNF)     Equipment Recommendations  Rolling walker with 5" wheels;3in1 (PT)    Recommendations for Other Services       Precautions / Restrictions Precautions Precautions: Fall Restrictions Weight Bearing Restrictions: No RLE Weight Bearing: Weight bearing as tolerated    Mobility  Bed Mobility               General bed mobility comments: sitting EOB on arrival to room.    Transfers Overall transfer level: Needs assistance Equipment used: Rolling walker (2 wheeled) Transfers: Sit to/from Stand Sit to Stand: Min assist         General transfer comment: min A to steady. Poor eccentric control into chair.  Ambulation/Gait Ambulation/Gait assistance: Min guard Gait Distance (Feet): 20 Feet Assistive device: Rolling walker (2 wheeled) Gait Pattern/deviations: Step-through pattern;Decreased stride length;Trunk flexed;Antalgic;Decreased stance time - right Gait velocity: decr   General Gait Details: Short distance gait in room for home O2 qualifications. Pt fatigued quickly with prolonged standing waiting for pulse ox reading.   Stairs             Wheelchair Mobility    Modified Rankin (Stroke Patients Only)       Balance Overall balance assessment: Needs assistance;History of  Falls Sitting-balance support: No upper extremity supported;Feet supported Sitting balance-Leahy Scale: Fair     Standing balance support: Bilateral upper extremity supported;During functional activity Standing balance-Leahy Scale: Poor Standing balance comment: walker and min guard for static sitting                            Cognition Arousal/Alertness: Awake/alert Behavior During Therapy: WFL for tasks assessed/performed Overall Cognitive Status: Within Functional Limits for tasks assessed                                 General Comments: Pt with strong opinions and reluctant to listen at times      Exercises      General Comments General comments (skin integrity, edema, etc.): Desats on RA. See Home O2 saturation qualifications note for details.      Pertinent Vitals/Pain Pain Assessment: Faces Faces Pain Scale: Hurts a little bit Pain Location: R hip Pain Descriptors / Indicators: Sore;Grimacing Pain Intervention(s): Monitored during session;Limited activity within patient's tolerance;Repositioned    Home Living                      Prior Function            PT Goals (current goals can now be found in the care plan section) Acute Rehab PT Goals Patient Stated Goal: go home PT Goal Formulation: With patient Time  For Goal Achievement: 08/25/20 Potential to Achieve Goals: Good Progress towards PT goals: Progressing toward goals    Frequency    Min 3X/week      PT Plan Current plan remains appropriate    Co-evaluation              AM-PAC PT "6 Clicks" Mobility   Outcome Measure  Help needed turning from your back to your side while in a flat bed without using bedrails?: A Little Help needed moving from lying on your back to sitting on the side of a flat bed without using bedrails?: A Little Help needed moving to and from a bed to a chair (including a wheelchair)?: A Little Help needed standing up from a chair  using your arms (e.g., wheelchair or bedside chair)?: A Little Help needed to walk in hospital room?: A Little Help needed climbing 3-5 steps with a railing? : A Little 6 Click Score: 18    End of Session Equipment Utilized During Treatment: Gait belt Activity Tolerance: Patient tolerated treatment well Patient left: with call bell/phone within reach;in chair;with chair alarm set;Other (comment) (MD preasent) Nurse Communication: Mobility status;Other (comment) (Desat on RA) PT Visit Diagnosis: Other abnormalities of gait and mobility (R26.89);Difficulty in walking, not elsewhere classified (R26.2);Pain Pain - Right/Left: Right Pain - part of body: Hip     Time: [id number] PT Time Calculation (min) (ACUTE ONLY): 20 min  Charges:  $Gait Training: 8-22 mins                     Benjiman Core, [place] Pager [phone] Acute Rehab  Allena Katz 08/16/2020, 9:57 AM

## 2020-08-16 NOTE — TOC Progression Note (Signed)
Transition of Care [facility]) - Progression Note    Patient Details  Name: PRAJIT BALTAZAR MRN: [id number] Date of Birth: 07-08-1931  Transition of Care [employer]) CM/SW Contact  Bartholomew Crews, RN Phone Number: [phone] 08/16/2020, 11:08 AM  Clinical Narrative:     Spoke with patient at the bedside. PTA home with wife, daughter, and 3 grandsons. Discussed home health needs and offered choice of home health agency.   Patient used hospital phone to call his wife and asked that CM discuss transition plans with her.   Discussed home health needs, spouse has used [facility] in the past. Referral accepted by Alvis Lemmings for PT, OT, SW - HH orders provided by MD.   Discussed DME needs. Has 3-N-1 in the home, declines needing an additional one for patient. Needs RW - referral to [facility] for RW and for home oxygen. [employer] to deliver portable oxygen and RW to the room, and will deliver home oxygen to the home today. Address verified with wife.   Daughter to provide transportation home when patient ready.   TOC following for transition needs.  Expected Discharge Plan: [place] Barriers to Discharge: No Barriers Identified  Expected Discharge Plan and Services Expected Discharge Plan: [place] In-house Referral: Clinical Social Work Discharge Planning Services: CM Consult Post Acute Care Choice: Home Health, Durable Medical Equipment Living arrangements for the past 2 months: Single Family Home Expected Discharge Date: 08/16/20               DME Arranged: Danny Lawless DME Agency: [facility] Date DME Agency Contacted: 08/16/20 Time DME Agency Contacted: [phone] Representative spoke with at DME Agency: [place]: PT, OT, Social Work [employer] Agency: [place] Date [place]: 08/16/20 Time [place]: 1058 Representative spoke with at [place]: [place] ([place]) Interventions    Readmission Risk  Interventions No flowsheet data found.

## 2020-08-16 NOTE — Progress Notes (Signed)
Discharge package printed and instructions given to pt and daughter. Verbalizes understanding.

## 2020-08-16 NOTE — Plan of Care (Signed)
  Problem: Pain Managment: Goal: General experience of comfort will improve Outcome: Progressing   Problem: Safety: Goal: Ability to remain free from injury will improve Outcome: Progressing   

## 2020-08-16 NOTE — Progress Notes (Signed)
SATURATION QUALIFICATIONS: (This note is used to comply with regulatory documentation for home oxygen)  Patient Saturations on Room Air at Rest = 86%  Patient Saturations on Room Air while Ambulating = 82%  Patient Saturations on 2 Liters of oxygen while Ambulating = 92%  Please briefly explain why patient needs home oxygen: Pt desats on RA at rest and with activity.   Benjiman Core, PTA Pager [phone] Acute Rehab

## 2020-08-18 NOTE — Discharge Summary (Signed)
Physician Discharge Summary  Lance Smith [id number] DOB: [birth year] DOA: 08/06/2020  PCP: Burnard Bunting, MD  Admit date: 08/06/2020 Discharge date: 08/16/2020  Admitted From: Home Disposition:  Home.   Recommendations for Outpatient Follow-up:  Follow up with PCP in 1-2 weeks Please obtain BMP/CBC in one week Please follow up with orthopedics as recommended.   Discharge Condition:stable.  CODE STATUS:full code.  Diet recommendation: Heart Healthy   Brief/Interim Summary:  87 year old man PMH including atrial fibrillation, complete heart block status post pacemaker implant 2013, CAD, prior MI, diastolic CHF, recurrent DVT, on apixaban, presented with hip pain after fall.  Admitted for closed right hip fracture.  Operative intervention delayed by anticoagulation.  S/p hip replacement 7/24.  Postoperative course uncomplicated.  Therapy eval recommending SNF. BUT pt and wife refused SNF placement and wanted to go home.   Discharge Diagnoses:  Principal Problem:   Closed right hip fracture (HCC) Active Problems:   Complete heart block (HCC)   Tobacco abuse   Chronic diastolic CHF (congestive heart failure) (HCC)   Atrial fibrillation (HCC)   CAD (coronary artery disease),Prior IMI    DVT, lower extremity, recurrent (HCC)   Rash   Leukocytosis   Type 2 diabetes mellitus without complication (HCC)   Aortic atherosclerosis (HCC)   Emphysema lung (HCC)  Assessment & Plan: * Closed right hip fracture ([place]) -- Status post mechanical fall at home. -- s/p surgery 7/24  -- resumed Xarelto -- Hip fracture protocol for pain management, etc. -- stable for SNF   Emphysema lung ([place]) --remains stable. Bronchodilators as needed. Not on oxygen at home.   Aortic atherosclerosis (HCC) --continue statin   Type 2 diabetes mellitus without complication (HCC) -- remains stable.  Metformin to be resumed on discharge.    DVT, lower extremity, recurrent ([place]) -- continue Xarelto     CAD (coronary artery disease),Prior IMI  -- Seen by cardiology, noted to be at high risk for surgery but cleared without further intervention or evaluation. -- Plavix resumed, continue statin.   Atrial fibrillation (HCC) -- Paced rhythm on EKG.  Stable, continue amiodarone. Resume xarelto and palvix.    Chronic diastolic CHF (congestive heart failure) (HCC) -- Appears euvolemic.  Continue torsemide. -- Last echocardiogram August 2021 with LVEF 60 to 65%.  Grade 1 diastolic dysfunction.   Complete heart block ([place]) -- Status post pacemaker insertion 2013.  EKG shows paced rhythm.      Discharge Instructions  Discharge Instructions     Diet - low sodium heart healthy   Complete by: As directed    Diet - low sodium heart healthy   Complete by: As directed    Discharge instructions   Complete by: As directed    Please follow up with orthopedics as recommended.   Increase activity slowly   Complete by: As directed    No wound care   Complete by: As directed    No wound care   Complete by: As directed       Allergies as of 08/16/2020       Reactions   Tetanus Toxoids Shortness Of Breath, Swelling   Aspirin Nausea Only, Other (See Comments)   headache   Penicillins Swelling   Did it involve swelling of the face/tongue/throat, SOB, or low BP? Yes Did it involve sudden or severe rash/hives, skin peeling, or any reaction on the inside of your mouth or nose? Unknown Did you need to seek medical attention at a hospital or doctor's office? Yes When  did it last happen? 30 years ago If all above answers are "NO", may proceed with cephalosporin use.        Medication List     STOP taking these medications    simvastatin 40 MG tablet Commonly known as: ZOCOR       TAKE these medications    amiodarone 200 MG tablet Commonly known as: Pacerone Take 1 tablet (200 mg total) by mouth daily. What changed: See the new instructions.   atorvastatin 20 MG tablet Commonly  known as: LIPITOR Take 1 tablet (20 mg total) by mouth daily.   bisacodyl 5 MG EC tablet Commonly known as: DULCOLAX Take 2 tablets (10 mg total) by mouth daily as needed for moderate constipation.   clopidogrel 75 MG tablet Commonly known as: Plavix Take 1 tablet (75 mg total) by mouth daily.   docusate sodium 100 MG capsule Commonly known as: COLACE Take 1 capsule (100 mg total) by mouth 2 (two) times daily.   feeding supplement (GLUCERNA SHAKE) Liqd Take 237 mLs by mouth 3 (three) times daily between meals.   fluocinonide ointment 0.05 % Commonly known as: LIDEX Apply 1 application topically 2 (two) times daily.   metFORMIN 500 MG tablet Commonly known as: GLUCOPHAGE Take 500 mg by mouth 2 (two) times daily with a meal.   multivitamin with minerals Tabs tablet Take 1 tablet by mouth daily. Centrum Silver   nicotine 21 mg/24hr patch Commonly known as: NICODERM CQ - dosed in mg/24 hours Place 1 patch (21 mg total) onto the skin daily.   nitroGLYCERIN 0.4 MG SL tablet Commonly known as: NITROSTAT Place 0.4 mg under the tongue every 5 (five) minutes as needed. For chest pain   OneTouch Verio test strip Generic drug: glucose blood 1 each by Other route See admin instructions. Once or twice daily   polyethylene glycol 17 g packet Commonly known as: MIRALAX / GLYCOLAX Take 17 g by mouth 2 (two) times daily.   predniSONE 10 MG tablet Commonly known as: DELTASONE Take 10 mg by mouth daily with breakfast.   ProAir HFA 108 (90 Base) MCG/ACT inhaler Generic drug: albuterol Inhale 2 puffs into the lungs See admin instructions. Inhale 2 puffs by mouth twice daily & every 4 hours as needed for wheezing or shortness of breath.   Rivaroxaban 15 MG Tabs tablet Commonly known as: XARELTO Take 1 tablet (15 mg total) by mouth daily.   senna 8.6 MG Tabs tablet Commonly known as: SENOKOT Take 1 tablet (8.6 mg total) by mouth 2 (two) times daily.   tamsulosin 0.4 MG Caps  capsule Commonly known as: FLOMAX Take 0.4 mg by mouth daily.   torsemide 20 MG tablet Commonly known as: Demadex Take 2 tablets (40 mg total) by mouth every other day.   triamcinolone cream 0.1 % Commonly known as: KENALOG Apply 1 application topically 2 (two) times daily.        Follow-up Information     S[place], Aaron Edelman, MD. Schedule an appointment as soon as possible for a visit in 2 week(s).   Specialty: Orthopedic Surgery Why: For wound re-check, For suture removal Contact information: [address] [place] [address] [id number]         Burnard Bunting, MD. Schedule an appointment as soon as possible for a visit in 1 week(s).   Specialty: Internal Medicine Contact information: [place] [address] [phone]         [facility] Follow up.  Specialty: Home Health Services Why: the office will call within 48 hours to schedule physical and occupational visits Contact information: [address] [place] [address] [phone]                Allergies  Allergen Reactions   Tetanus Toxoids Shortness Of Breath and Swelling   Aspirin Nausea Only and Other (See Comments)    headache   Penicillins Swelling    Did it involve swelling of the face/tongue/throat, SOB, or low BP? Yes Did it involve sudden or severe rash/hives, skin peeling, or any reaction on the inside of your mouth or nose? Unknown Did you need to seek medical attention at a hospital or doctor's office? Yes When did it last happen? 30 years ago If all above answers are "NO", may proceed with cephalosporin use.     Consultations: Orthopedics.    Procedures/Studies: DG Ankle Complete Right  Result Date: 08/06/2020 CLINICAL DATA:  Fall with right ankle pain EXAM: RIGHT ANKLE - COMPLETE 3+ VIEW COMPARISON:  None. FINDINGS: Lateral soft tissue swelling. No definitive fracture or malalignment. Ankle mortise is  symmetric. IMPRESSION: Soft tissue swelling.  No definite acute osseous abnormality Electronically Signed   By: Donavan Foil M.D.   On: 08/06/2020 15:31   Pelvis Portable  Result Date: 08/10/2020 CLINICAL DATA:  Status post right hip replacement. EXAM: PORTABLE PELVIS 1-2 VIEWS COMPARISON:  Operative images obtained earlier today. Right hip radiographs and CT, 08/06/2020. FINDINGS: New total right hip arthroplasty appears well seated and aligned. No acute fracture or evidence of an operative complication. Skeletal structures are diffusely demineralized. IMPRESSION: Well-positioned total right hip arthroplasty. Electronically Signed   By: Lajean Manes M.D.   On: 08/10/2020 15:32   CT HIP RIGHT WO CONTRAST  Result Date: 08/06/2020 CLINICAL DATA:  Right hip pain after a fall while walking today. Initial encounter. EXAM: CT OF THE RIGHT HIP WITHOUT CONTRAST TECHNIQUE: Multidetector CT imaging of the right hip was performed according to the standard protocol. Multiplanar CT image reconstructions were also generated. COMPARISON:  Plain films right hip today. FINDINGS: Bones/Joint/Cartilage As seen on the comparison examination, the patient has a mildly impacted subcapital fracture of the right hip. There is also mild anterior displacement of the femoral neck. No other fracture is identified. The hip is located. No lytic or sclerotic lesion is identified. Mild appearing right hip osteoarthritis noted. Ligaments Suboptimally assessed by CT. Muscles and Tendons Appear intact. Soft tissues Prostatomegaly.  Sigmoid diverticulosis. IMPRESSION: Mildly impacted and anteriorly displaced subcapital fracture right hip. No other acute finding. Mild right hip osteoarthritis. Prostatomegaly. Sigmoid diverticulosis. Electronically Signed   By: Inge Rise M.D.   On: 08/06/2020 14:58   DG CHEST PORT 1 VIEW  Result Date: 08/16/2020 CLINICAL DATA:  87 year old male with shortness of breath. EXAM: PORTABLE CHEST 1 VIEW  COMPARISON:  Chest radiograph 06/19/2020 and earlier. FINDINGS: Portable AP semi upright view at 1039 hours. Stable left chest dual lumen cardiac pacemaker and cardiomegaly. Tortuous thoracic aorta with calcified plaque. Other mediastinal contours are within normal limits. Visualized tracheal air column is within normal limits. Chronic left lung base hypo ventilation appears stable. Elsewhere when allowing for portable technique the lungs are clear. Centrilobular emphysema demonstrated by CT in May this year. IMPRESSION: No acute cardiopulmonary abnormality. Cardiomegaly, Aortic Atherosclerosis (ICD10-I70.0), and Emphysema (ICD10-J43.9). Electronically Signed   By: Genevie Ann M.D.   On: 08/16/2020 11:19   DG C-Arm 1-60 Min  Result Date: 08/10/2020 CLINICAL DATA:  Right hip replacement EXAM: DG C-ARM 1-60 MIN FLUOROSCOPY TIME:  Fluoroscopy Time:  8.3 seconds Number of Acquired Spot Images: 6 COMPARISON:  None. FINDINGS: Six images were obtained during the right hip replacement. Hardware is in good position by the end of the study. IMPRESSION: Right hip replacement as above. Electronically Signed   By: Dorise Bullion III M.D   On: 08/10/2020 15:42   DG HIP OPERATIVE UNILAT WITH PELVIS RIGHT  Result Date: 08/10/2020 CLINICAL DATA:  Right hip arthroplasty. FLUOROSCOPY TIME:  8.3 seconds. EXAM: OPERATIVE RIGHT HIP (WITH PELVIS IF PERFORMED) 6 VIEWS TECHNIQUE: Fluoroscopic spot image(s) were submitted for interpretation post-operatively. COMPARISON:  August 06, 2020 FINDINGS: Six images were obtained intraoperatively during replacement of the right hip. Acetabular and femoral components are in good position based on provided images by the end of the study. IMPRESSION: Right hip replacement as above. Electronically Signed   By: Dorise Bullion III M.D   On: 08/10/2020 15:42   DG Hip Unilat W or Wo Pelvis 2-3 Views Right  Result Date: 08/06/2020 CLINICAL DATA:  [place] while walking. Unable to bear weight on the right  leg. EXAM: DG HIP (WITH OR WITHOUT PELVIS) 2-3V RIGHT COMPARISON:  None. FINDINGS: There is a mildly impacted but otherwise nondisplaced subcapital fracture of the right hip. The left hip is intact. The pubic symphysis and SI joints are intact. No pelvic fractures or bone lesions. Advanced vascular calcifications. IMPRESSION: Mildly impacted but otherwise nondisplaced subcapital fracture of the right hip. Electronically Signed   By: Marijo Sanes M.D.   On: 08/06/2020 14:01      Subjective: No new complaints.   Discharge Exam: Vitals:   08/16/20 0805 08/16/20 1207  BP: 109/69 122/65  Pulse: 87 90  Resp:    Temp: 98.8 F (37.1 C) 99.2 F (37.3 C)  SpO2: 90% 95%   Vitals:   08/15/20 2100 08/16/20 0500 08/16/20 0805 08/16/20 1207  BP: 103/61  109/69 122/65  Pulse: 70  87 90  Resp: 17     Temp: 98.8 F (37.1 C)  98.8 F (37.1 C) 99.2 F (37.3 C)  TempSrc: Oral  Oral   SpO2: 96%  90% 95%  Weight:  68.6 kg    Height:        General: Pt is alert, awake, not in acute distress Cardiovascular: RRR, S1/S2 +, no rubs, no gallops Respiratory: CTA bilaterally, no wheezing, no rhonchi Abdominal: Soft, NT, ND, bowel sounds + Extremities: no edema, no cyanosis    The results of significant diagnostics from this hospitalization (including imaging, microbiology, ancillary and laboratory) are listed below for reference.     Microbiology: Recent Results (from the past 240 hour(s))  Resp Panel by RT-PCR (Flu A&B, Covid) Nasopharyngeal Swab     Status: None   Collection Time: 08/15/20 11:05 AM   Specimen: Nasopharyngeal Swab; Nasopharyngeal(NP) swabs in vial transport medium  Result Value Ref Range Status   SARS Coronavirus 2 by RT PCR NEGATIVE NEGATIVE Final    Comment: (NOTE) SARS-CoV-2 target nucleic acids are NOT DETECTED.  The SARS-CoV-2 RNA is generally detectable in upper respiratory specimens during the acute phase of infection. The lowest concentration of SARS-CoV-2 viral  copies this assay can detect is 138 copies/mL. A negative result does not preclude SARS-Cov-2 infection and should not be used as the sole basis for treatment or other patient management decisions. A negative result may occur with  improper specimen collection/handling, submission of specimen other than nasopharyngeal swab, presence of  viral mutation(s) within the areas targeted by this assay, and inadequate number of viral copies(<138 copies/mL). A negative result must be combined with clinical observations, patient history, and epidemiological information. The expected result is Negative.  Fact Sheet for Patients:  EntrepreneurPulse.com.au  Fact Sheet for Healthcare Providers:  IncredibleEmployment.be  This test is no t yet approved or cleared by the [place] FDA and  has been authorized for detection and/or diagnosis of SARS-CoV-2 by FDA under an Emergency Use Authorization (EUA). This EUA will remain  in effect (meaning this test can be used) for the duration of the COVID-19 declaration under Section 564(b)(1) of the Act, 21 U.S.C.section 360bbb-3(b)(1), unless the authorization is terminated  or revoked sooner.       Influenza A by PCR NEGATIVE NEGATIVE Final   Influenza B by PCR NEGATIVE NEGATIVE Final    Comment: (NOTE) The Xpert Xpress SARS-CoV-2/FLU/RSV plus assay is intended as an aid in the diagnosis of influenza from Nasopharyngeal swab specimens and should not be used as a sole basis for treatment. Nasal washings and aspirates are unacceptable for Xpert Xpress SARS-CoV-2/FLU/RSV testing.  Fact Sheet for Patients: EntrepreneurPulse.com.au  Fact Sheet for Healthcare Providers: IncredibleEmployment.be  This test is not yet approved or cleared by the [place] FDA and has been authorized for detection and/or diagnosis of SARS-CoV-2 by FDA under an Emergency Use Authorization (EUA). This  EUA will remain in effect (meaning this test can be used) for the duration of the COVID-19 declaration under Section 564(b)(1) of the Act, 21 U.S.C. section 360bbb-3(b)(1), unless the authorization is terminated or revoked.  Performed at [facility], [place] [address], [place], [place] [address]      Labs: BNP (last 3 results) Recent Labs    06/19/20 1338  BNP 294.6*    Basic Metabolic Panel: Recent Labs  Lab 08/12/20 0436 08/13/20 0146 08/14/20 0142 08/15/20 0112 08/16/20 0057  NA 138 137 134* 136 134*  K 5.1 4.5 4.4 4.3 4.6  CL 101 99 100 98 98  CO2 '31 31 27 30 29  '$ GLUCOSE 133* 164* 147* 149* 140*  BUN 27* 29* 21 27* 30*  CREATININE 1.24 1.22 1.05 1.26* 1.16  CALCIUM 8.5* 8.3* 8.5* 8.5* 8.5*    Liver Function Tests: No results for input(s): AST, ALT, ALKPHOS, BILITOT, PROT, ALBUMIN in the last 168 hours. No results for input(s): LIPASE, AMYLASE in the last 168 hours. No results for input(s): AMMONIA in the last 168 hours. CBC: Recent Labs  Lab 08/12/20 0436 08/13/20 0146 08/14/20 0142 08/15/20 0112 08/16/20 0057  WBC 13.4* 14.0* 12.1* 11.7* 13.9*  HGB 11.4* 12.0* 11.9* 11.9* 11.8*  HCT 34.9* 35.5* 36.6* 35.7* 36.2*  MCV 104.2* 103.8* 104.0* 104.4* 103.1*  PLT 226 234 241 251 271    Cardiac Enzymes: No results for input(s): CKTOTAL, CKMB, CKMBINDEX, TROPONINI in the last 168 hours. BNP: Invalid input(s): POCBNP CBG: Recent Labs  Lab 08/15/20 1200 08/15/20 1700 08/15/20 2058 08/16/20 0647 08/16/20 1204  GLUCAP 153* 189* 115* 167* 147*    D-Dimer No results for input(s): DDIMER in the last 72 hours. Hgb A1c No results for input(s): HGBA1C in the last 72 hours. Lipid Profile No results for input(s): CHOL, HDL, LDLCALC, TRIG, CHOLHDL, LDLDIRECT in the last 72 hours. Thyroid function studies No results for input(s): TSH, T4TOTAL, T3FREE, THYROIDAB in the last 72 hours.  Invalid input(s): FREET3 Anemia work up No results for input(s):  VITAMINB12, FOLATE, FERRITIN, TIBC, IRON, RETICCTPCT in the last 23  hours. Urinalysis    Component Value Date/Time   COLORURINE YELLOW 07/13/2013 1225   APPEARANCEUR CLEAR 07/13/2013 1225   LABSPEC 1.010 07/13/2013 1225   PHURINE 6.0 07/13/2013 1225   GLUCOSEU NEGATIVE 07/13/2013 1225   HGBUR MODERATE (A) 07/13/2013 1225   BILIRUBINUR NEGATIVE 07/13/2013 1225   KETONESUR NEGATIVE 07/13/2013 1225   PROTEINUR NEGATIVE 07/13/2013 1225   UROBILINOGEN 0.2 07/13/2013 1225   NITRITE NEGATIVE 07/13/2013 1225   LEUKOCYTESUR TRACE (A) 07/13/2013 1225   Sepsis Labs Invalid input(s): PROCALCITONIN,  WBC,  LACTICIDVEN Microbiology Recent Results (from the past 240 hour(s))  Resp Panel by RT-PCR (Flu A&B, Covid) Nasopharyngeal Swab     Status: None   Collection Time: 08/15/20 11:05 AM   Specimen: Nasopharyngeal Swab; Nasopharyngeal(NP) swabs in vial transport medium  Result Value Ref Range Status   SARS Coronavirus 2 by RT PCR NEGATIVE NEGATIVE Final    Comment: (NOTE) SARS-CoV-2 target nucleic acids are NOT DETECTED.  The SARS-CoV-2 RNA is generally detectable in upper respiratory specimens during the acute phase of infection. The lowest concentration of SARS-CoV-2 viral copies this assay can detect is 138 copies/mL. A negative result does not preclude SARS-Cov-2 infection and should not be used as the sole basis for treatment or other patient management decisions. A negative result may occur with  improper specimen collection/handling, submission of specimen other than nasopharyngeal swab, presence of viral mutation(s) within the areas targeted by this assay, and inadequate number of viral copies(<138 copies/mL). A negative result must be combined with clinical observations, patient history, and epidemiological information. The expected result is Negative.  Fact Sheet for Patients:  EntrepreneurPulse.com.au  Fact Sheet for Healthcare Providers:   IncredibleEmployment.be  This test is no t yet approved or cleared by the [place] FDA and  has been authorized for detection and/or diagnosis of SARS-CoV-2 by FDA under an Emergency Use Authorization (EUA). This EUA will remain  in effect (meaning this test can be used) for the duration of the COVID-19 declaration under Section 564(b)(1) of the Act, 21 U.S.C.section 360bbb-3(b)(1), unless the authorization is terminated  or revoked sooner.       Influenza A by PCR NEGATIVE NEGATIVE Final   Influenza B by PCR NEGATIVE NEGATIVE Final    Comment: (NOTE) The Xpert Xpress SARS-CoV-2/FLU/RSV plus assay is intended as an aid in the diagnosis of influenza from Nasopharyngeal swab specimens and should not be used as a sole basis for treatment. Nasal washings and aspirates are unacceptable for Xpert Xpress SARS-CoV-2/FLU/RSV testing.  Fact Sheet for Patients: EntrepreneurPulse.com.au  Fact Sheet for Healthcare Providers: IncredibleEmployment.be  This test is not yet approved or cleared by the [place] FDA and has been authorized for detection and/or diagnosis of SARS-CoV-2 by FDA under an Emergency Use Authorization (EUA). This EUA will remain in effect (meaning this test can be used) for the duration of the COVID-19 declaration under Section 564(b)(1) of the Act, 21 U.S.C. section 360bbb-3(b)(1), unless the authorization is terminated or revoked.  Performed at [facility], [place] [address], [place], [place] [address]      Time coordinating discharge: 38 minutes.   SIGNED:   Hosie Poisson, MD  [facility]

## 2020-09-23 ENCOUNTER — Ambulatory Visit: Payer: Medicare Other

## 2020-09-25 ENCOUNTER — Telehealth: Payer: Self-pay

## 2020-09-25 LAB — CUP PACEART REMOTE DEVICE CHECK
Battery Remaining Longevity: 1 mo
Battery Remaining Percentage: 2 %
Battery Voltage: 2.66 V
Brady Statistic AP VP Percent: 28 %
Brady Statistic AP VS Percent: 1 %
Brady Statistic AS VP Percent: 71 %
Brady Statistic AS VS Percent: 1 %
Brady Statistic RA Percent Paced: 27 %
Brady Statistic RV Percent Paced: 99 %
Date Time Interrogation Session: 20220907191056
Implantable Lead Implant Date: 20130502
Implantable Lead Implant Date: 20130502
Implantable Lead Location: 753859
Implantable Lead Location: 753860
Implantable Pulse Generator Implant Date: 20130502
Lead Channel Impedance Value: 360 Ohm
Lead Channel Impedance Value: 440 Ohm
Lead Channel Pacing Threshold Amplitude: 0.75 V
Lead Channel Pacing Threshold Amplitude: 0.75 V
Lead Channel Pacing Threshold Pulse Width: 0.5 ms
Lead Channel Pacing Threshold Pulse Width: 0.5 ms
Lead Channel Sensing Intrinsic Amplitude: 12 mV
Lead Channel Sensing Intrinsic Amplitude: 2.9 mV
Lead Channel Setting Pacing Amplitude: 2 V
Lead Channel Setting Pacing Amplitude: 2.5 V
Lead Channel Setting Pacing Pulse Width: 0.5 ms
Lead Channel Setting Sensing Sensitivity: 4 mV
Pulse Gen Model: 2110
Pulse Gen Serial Number: 7321242

## 2020-09-25 NOTE — Telephone Encounter (Signed)
"  Scheduled remote reviewed. Normal device function.   The device estimates 1 month until the elective replacement indicator is met, sent to triage"  Successful telephone encounter to patient's wife Deneise Lever (on [place]) as patient is unable to hear over the telephone. Discussed with wife that battery has approx 1 month until ERI and patient will be scheduled for monthly battery checks. Wife appreciative of call and will relay message to patient.

## 2020-10-07 ENCOUNTER — Other Ambulatory Visit: Payer: Self-pay | Admitting: Orthopedic Surgery

## 2020-10-07 DIAGNOSIS — M546 Pain in thoracic spine: Secondary | ICD-10-CM

## 2020-10-16 ENCOUNTER — Other Ambulatory Visit (HOSPITAL_BASED_OUTPATIENT_CLINIC_OR_DEPARTMENT_OTHER): Payer: Medicare Other

## 2020-10-16 ENCOUNTER — Other Ambulatory Visit (HOSPITAL_COMMUNITY): Payer: Self-pay | Admitting: Orthopedic Surgery

## 2020-10-16 ENCOUNTER — Other Ambulatory Visit: Payer: Self-pay | Admitting: Orthopedic Surgery

## 2020-10-16 ENCOUNTER — Other Ambulatory Visit: Payer: Self-pay

## 2020-10-16 ENCOUNTER — Ambulatory Visit (HOSPITAL_BASED_OUTPATIENT_CLINIC_OR_DEPARTMENT_OTHER)
Admission: RE | Admit: 2020-10-16 | Discharge: 2020-10-16 | Disposition: A | Discharge status: Home / Self Care | Payer: Medicare Other | Source: Ambulatory Visit | Attending: Orthopedic Surgery | Admitting: Orthopedic Surgery

## 2020-10-16 DIAGNOSIS — M545 Low back pain, unspecified: Secondary | ICD-10-CM | POA: Diagnosis present

## 2020-10-16 DIAGNOSIS — M546 Pain in thoracic spine: Secondary | ICD-10-CM

## 2020-10-27 ENCOUNTER — Ambulatory Visit (INDEPENDENT_AMBULATORY_CARE_PROVIDER_SITE_OTHER): Payer: Medicare Other

## 2020-10-27 DIAGNOSIS — I442 Atrioventricular block, complete: Secondary | ICD-10-CM

## 2020-10-30 LAB — CUP PACEART REMOTE DEVICE CHECK
Battery Remaining Longevity: 1 mo
Battery Remaining Percentage: 1 %
Battery Voltage: 2.63 V
Brady Statistic AP VP Percent: 34 %
Brady Statistic AP VS Percent: 1 %
Brady Statistic AS VP Percent: 66 %
Brady Statistic AS VS Percent: 1 %
Brady Statistic RA Percent Paced: 32 %
Brady Statistic RV Percent Paced: 99 %
Date Time Interrogation Session: 20221007084400
Implantable Lead Implant Date: 20130502
Implantable Lead Implant Date: 20130502
Implantable Lead Location: 753859
Implantable Lead Location: 753860
Implantable Pulse Generator Implant Date: 20130502
Lead Channel Impedance Value: 360 Ohm
Lead Channel Impedance Value: 400 Ohm
Lead Channel Pacing Threshold Amplitude: 0.75 V
Lead Channel Pacing Threshold Amplitude: 0.75 V
Lead Channel Pacing Threshold Pulse Width: 0.5 ms
Lead Channel Pacing Threshold Pulse Width: 0.5 ms
Lead Channel Sensing Intrinsic Amplitude: 12 mV
Lead Channel Sensing Intrinsic Amplitude: 2.2 mV
Lead Channel Setting Pacing Amplitude: 2 V
Lead Channel Setting Pacing Amplitude: 2.5 V
Lead Channel Setting Pacing Pulse Width: 0.5 ms
Lead Channel Setting Sensing Sensitivity: 4 mV
Pulse Gen Model: 2110
Pulse Gen Serial Number: 7321242

## 2020-11-04 NOTE — Addendum Note (Signed)
Addended by: Douglass Rivers D on: 11/04/2020 01:09 PM   Modules accepted: Level of Service

## 2020-11-04 NOTE — Progress Notes (Signed)
Remote pacemaker transmission.   

## 2020-11-12 ENCOUNTER — Other Ambulatory Visit: Payer: Self-pay

## 2020-11-12 ENCOUNTER — Ambulatory Visit (INDEPENDENT_AMBULATORY_CARE_PROVIDER_SITE_OTHER): Payer: Medicare Other | Admitting: Otolaryngology

## 2020-11-12 DIAGNOSIS — H903 Sensorineural hearing loss, bilateral: Secondary | ICD-10-CM

## 2020-11-12 DIAGNOSIS — H6123 Impacted cerumen, bilateral: Secondary | ICD-10-CM | POA: Diagnosis not present

## 2020-11-12 NOTE — Progress Notes (Signed)
HPI: Lance Smith is a 87 y.o. male who presents for evaluation of wax buildup in his ears referred by [facility].  Patient wears bilateral hearing aids..  Past Medical History:  Diagnosis Date   Adenomatous colon polyp 01/2002   Arthritis    CKD (chronic kidney disease) stage 3, GFR 30-59 ml/min (HCC)    Complete heart block ([place])    a. 05/2011 s/p SJM Model [id number] Dual Chamber PPM   COPD (chronic obstructive pulmonary disease) (HCC)    Diabetes mellitus (HCC)    Diastolic CHF ([place])    a. 08/1446 Echo: EF 55-60%   DVT (deep venous thrombosis), unspecified laterality    a. previously on coumadin - d/c'd spring of 2013   Hyperglycemia    Hyperlipidemia    Hypertension    Inferior myocardial infarction ([place]) 08/22/2013   Steroid dependence ([place])    Tobacco abuse    Past Surgical History:  Procedure Laterality Date   bilateral inguinal hernia     CARDIOVERSION N/A 06/16/2018   Procedure: CARDIOVERSION;  Surgeon: Fay Records, MD;  Location: [facility] ENDOSCOPY;  Service: Cardiovascular;  Laterality: N/A;   MELANOMA EXCISION     PACEMAKER INSERTION  May 2013   PERMANENT PACEMAKER INSERTION Left 05/20/2011   Procedure: PERMANENT PACEMAKER INSERTION;  Surgeon: Deboraha Sprang, MD;  Location: [facility] CATH LAB;  Service: Cardiovascular;  Laterality: Left;   TOTAL HIP ARTHROPLASTY Right 08/10/2020   Procedure: TOTAL HIP ARTHROPLASTY ANTERIOR APPROACH;  Surgeon: Rod Can, MD;  Location: [facility];  Service: Orthopedics;  Laterality: Right;   Social History   Socioeconomic History   Marital status: Married    Spouse name: Not on file   Number of children: Not on file   Years of education: Not on file   Highest education level: Not on file  Occupational History   Occupation: retired  Tobacco Use   Smoking status: Every Day    Packs/day: 2.00    Types: Cigarettes   Smokeless tobacco: Never  Vaping Use   Vaping Use: Never used  Substance and Sexual Activity   Alcohol use: No     Alcohol/week: 0.0 standard drinks   Drug use: No   Sexual activity: Not Currently  Other Topics Concern   Not on file  Social History Narrative   Not on file   Social Determinants of Health   Financial Resource Strain: Not on file  Food Insecurity: Not on file  Transportation Needs: Not on file  Physical Activity: Not on file  Stress: Not on file  Social Connections: Not on file   Family History  Problem Relation Age of Onset   Leukemia Mother    Colon cancer Neg Hx    Allergies  Allergen Reactions   Tetanus Toxoids Shortness Of Breath and Swelling   Aspirin Nausea Only and Other (See Comments)    headache   Penicillins Swelling    Did it involve swelling of the face/tongue/throat, SOB, or low BP? Yes Did it involve sudden or severe rash/hives, skin peeling, or any reaction on the inside of your mouth or nose? Unknown Did you need to seek medical attention at a hospital or doctor's office? Yes When did it last happen? 30 years ago If all above answers are "NO", may proceed with cephalosporin use.    Prior to Admission medications   Medication Sig Start Date End Date Taking? Authorizing Provider  amiodarone (PACERONE) 200 MG tablet Take 1 tablet (200 mg total) by mouth daily.  08/15/20 12/13/20  Hosie Poisson, MD  atorvastatin (LIPITOR) 20 MG tablet Take 1 tablet (20 mg total) by mouth daily. 08/15/20   Hosie Poisson, MD  bisacodyl (DULCOLAX) 5 MG EC tablet Take 2 tablets (10 mg total) by mouth daily as needed for moderate constipation. 08/16/20   Hosie Poisson, MD  clopidogrel (PLAVIX) 75 MG tablet Take 1 tablet (75 mg total) by mouth daily. 06/04/20   Deboraha Sprang, MD  docusate sodium (COLACE) 100 MG capsule Take 1 capsule (100 mg total) by mouth 2 (two) times daily. 08/14/20   Hosie Poisson, MD  feeding supplement, GLUCERNA SHAKE, (GLUCERNA SHAKE) LIQD Take 237 mLs by mouth 3 (three) times daily between meals. 08/14/20   Hosie Poisson, MD  fluocinonide ointment (LIDEX) 0.45 %  Apply 1 application topically 2 (two) times daily. 07/25/20   [provider]  metFORMIN (GLUCOPHAGE) 500 MG tablet Take 500 mg by mouth 2 (two) times daily with a meal. 07/13/15   [provider]  Multiple Vitamin (MULTIVITAMIN WITH MINERALS) TABS tablet Take 1 tablet by mouth daily. Centrum Silver    [provider]  nicotine (NICODERM CQ - DOSED IN MG/24 HOURS) 21 mg/24hr patch Place 1 patch (21 mg total) onto the skin daily. 08/15/20   Hosie Poisson, MD  nitroGLYCERIN (NITROSTAT) 0.4 MG SL tablet Place 0.4 mg under the tongue every 5 (five) minutes as needed. For chest pain    [provider]  [facility] VERIO test strip 1 each by Other route See admin instructions. Once or twice daily 08/05/20   [provider]  polyethylene glycol (MIRALAX / GLYCOLAX) 17 g packet Take 17 g by mouth 2 (two) times daily. 08/14/20   Hosie Poisson, MD  predniSONE (DELTASONE) 10 MG tablet Take 10 mg by mouth daily with breakfast.    [provider]  PROAIR HFA 108 (90 Base) MCG/ACT inhaler Inhale 2 puffs into the lungs See admin instructions. Inhale 2 puffs by mouth twice daily & every 4 hours as needed for wheezing or shortness of breath. 05/05/18   [provider]  Rivaroxaban (XARELTO) 15 MG TABS tablet Take 1 tablet (15 mg total) by mouth daily. 10/20/18   Shirley Friar, PA-C  senna (SENOKOT) 8.6 MG TABS tablet Take 1 tablet (8.6 mg total) by mouth 2 (two) times daily. 08/14/20   Hosie Poisson, MD  tamsulosin (FLOMAX) 0.4 MG CAPS capsule Take 0.4 mg by mouth daily. 02/27/18   [provider]  torsemide (DEMADEX) 20 MG tablet Take 2 tablets (40 mg total) by mouth every other day. 08/14/20   Hosie Poisson, MD  triamcinolone cream (KENALOG) 0.1 % Apply 1 application topically 2 (two) times daily. 07/09/20   [provider]     Positive ROS: Otherwise negative  All other systems have been reviewed and were otherwise negative with the  exception of those mentioned in the HPI and as above.  Physical Exam: Constitutional: Alert, well-appearing, no acute distress Ears: External ears without lesions or tenderness. Ear canals will large amount of wax in both ear canals that was adjacent to the TM.  This was cleaned with suction curette and forceps.  The TMs were clear bilaterally.. Nasal: External nose without lesions. Clear nasal passages Oral: Oropharynx clear. Neck: No palpable adenopathy or masses Respiratory: Breathing comfortably  Skin: No facial/neck lesions or rash noted.  Cerumen impaction removal  Date/Time: 11/12/2020 12:39 PM Performed by: Rozetta Nunnery, MD Authorized by: Rozetta Nunnery, MD   Consent:  Consent obtained:  Verbal   Consent given by:  Patient   Risks discussed:  Pain and bleeding Procedure details:    Location:  L ear and R ear   Procedure type: curette, suction and forceps   Post-procedure details:    Inspection:  TM intact and canal normal   Hearing quality:  Improved   Procedure completion:  Tolerated well, no immediate complications Comments:     Had moderate amount of wax in both ear canals occluding the ear canals and blocking the TMs.  This was cleaned in the office and TMs were otherwise clear.  Assessment: Bilateral cerumen impactions in patient who wears hearing aids.  Plan: This was cleaned in the office.  He will follow-up as needed.  Radene Journey, MD

## 2020-11-27 ENCOUNTER — Ambulatory Visit (INDEPENDENT_AMBULATORY_CARE_PROVIDER_SITE_OTHER): Payer: Medicare Other

## 2020-11-27 DIAGNOSIS — I442 Atrioventricular block, complete: Secondary | ICD-10-CM

## 2020-11-27 LAB — CUP PACEART REMOTE DEVICE CHECK
Battery Remaining Longevity: 1 mo
Battery Remaining Percentage: 0.5 %
Battery Voltage: 2.62 V
Brady Statistic AP VP Percent: 35 %
Brady Statistic AP VS Percent: 1 %
Brady Statistic AS VP Percent: 64 %
Brady Statistic AS VS Percent: 1 %
Brady Statistic RA Percent Paced: 34 %
Brady Statistic RV Percent Paced: 99 %
Date Time Interrogation Session: 20221110155821
Implantable Lead Implant Date: 20130502
Implantable Lead Implant Date: 20130502
Implantable Lead Location: 753859
Implantable Lead Location: 753860
Implantable Pulse Generator Implant Date: 20130502
Lead Channel Impedance Value: 360 Ohm
Lead Channel Impedance Value: 440 Ohm
Lead Channel Pacing Threshold Amplitude: 0.75 V
Lead Channel Pacing Threshold Amplitude: 0.75 V
Lead Channel Pacing Threshold Pulse Width: 0.5 ms
Lead Channel Pacing Threshold Pulse Width: 0.5 ms
Lead Channel Sensing Intrinsic Amplitude: 12 mV
Lead Channel Sensing Intrinsic Amplitude: 4.4 mV
Lead Channel Setting Pacing Amplitude: 2 V
Lead Channel Setting Pacing Amplitude: 2.5 V
Lead Channel Setting Pacing Pulse Width: 0.5 ms
Lead Channel Setting Sensing Sensitivity: 4 mV
Pulse Gen Model: 2110
Pulse Gen Serial Number: 7321242

## 2020-12-05 NOTE — Progress Notes (Signed)
Remote pacemaker transmission.   

## 2020-12-24 NOTE — Progress Notes (Signed)
Monthly battery checks are scheduled.

## 2021-01-15 ENCOUNTER — Telehealth: Payer: Self-pay

## 2021-01-15 ENCOUNTER — Inpatient Hospital Stay (HOSPITAL_COMMUNITY): Payer: Medicare Other

## 2021-01-15 ENCOUNTER — Inpatient Hospital Stay (HOSPITAL_COMMUNITY)
Admission: EM | Admit: 2021-01-15 | Discharge: 2021-01-18 | DRG: 291 | Disposition: E | Payer: Medicare Other | Attending: Internal Medicine | Admitting: Internal Medicine

## 2021-01-15 ENCOUNTER — Encounter (HOSPITAL_COMMUNITY): Payer: Self-pay | Admitting: *Deleted

## 2021-01-15 ENCOUNTER — Other Ambulatory Visit: Payer: Self-pay

## 2021-01-15 ENCOUNTER — Emergency Department (HOSPITAL_COMMUNITY): Payer: Medicare Other

## 2021-01-15 DIAGNOSIS — R634 Abnormal weight loss: Secondary | ICD-10-CM | POA: Diagnosis not present

## 2021-01-15 DIAGNOSIS — D631 Anemia in chronic kidney disease: Secondary | ICD-10-CM | POA: Diagnosis present

## 2021-01-15 DIAGNOSIS — R778 Other specified abnormalities of plasma proteins: Secondary | ICD-10-CM | POA: Diagnosis present

## 2021-01-15 DIAGNOSIS — Z681 Body mass index (BMI) 19 or less, adult: Secondary | ICD-10-CM | POA: Diagnosis not present

## 2021-01-15 DIAGNOSIS — J9601 Acute respiratory failure with hypoxia: Secondary | ICD-10-CM | POA: Diagnosis present

## 2021-01-15 DIAGNOSIS — I4891 Unspecified atrial fibrillation: Secondary | ICD-10-CM | POA: Diagnosis present

## 2021-01-15 DIAGNOSIS — Z950 Presence of cardiac pacemaker: Secondary | ICD-10-CM | POA: Diagnosis not present

## 2021-01-15 DIAGNOSIS — Z96641 Presence of right artificial hip joint: Secondary | ICD-10-CM | POA: Diagnosis present

## 2021-01-15 DIAGNOSIS — R296 Repeated falls: Secondary | ICD-10-CM | POA: Diagnosis present

## 2021-01-15 DIAGNOSIS — R64 Cachexia: Secondary | ICD-10-CM | POA: Diagnosis present

## 2021-01-15 DIAGNOSIS — I442 Atrioventricular block, complete: Secondary | ICD-10-CM | POA: Diagnosis present

## 2021-01-15 DIAGNOSIS — Z720 Tobacco use: Secondary | ICD-10-CM | POA: Diagnosis present

## 2021-01-15 DIAGNOSIS — I509 Heart failure, unspecified: Secondary | ICD-10-CM | POA: Diagnosis not present

## 2021-01-15 DIAGNOSIS — R0603 Acute respiratory distress: Secondary | ICD-10-CM

## 2021-01-15 DIAGNOSIS — Z8673 Personal history of transient ischemic attack (TIA), and cerebral infarction without residual deficits: Secondary | ICD-10-CM

## 2021-01-15 DIAGNOSIS — D649 Anemia, unspecified: Secondary | ICD-10-CM | POA: Diagnosis present

## 2021-01-15 DIAGNOSIS — N400 Enlarged prostate without lower urinary tract symptoms: Secondary | ICD-10-CM | POA: Diagnosis present

## 2021-01-15 DIAGNOSIS — Z974 Presence of external hearing-aid: Secondary | ICD-10-CM

## 2021-01-15 DIAGNOSIS — Z86718 Personal history of other venous thrombosis and embolism: Secondary | ICD-10-CM

## 2021-01-15 DIAGNOSIS — G8929 Other chronic pain: Secondary | ICD-10-CM | POA: Diagnosis present

## 2021-01-15 DIAGNOSIS — W19XXXA Unspecified fall, initial encounter: Secondary | ICD-10-CM | POA: Diagnosis present

## 2021-01-15 DIAGNOSIS — Z20822 Contact with and (suspected) exposure to covid-19: Secondary | ICD-10-CM | POA: Diagnosis present

## 2021-01-15 DIAGNOSIS — E1122 Type 2 diabetes mellitus with diabetic chronic kidney disease: Secondary | ICD-10-CM | POA: Diagnosis present

## 2021-01-15 DIAGNOSIS — I82409 Acute embolism and thrombosis of unspecified deep veins of unspecified lower extremity: Secondary | ICD-10-CM | POA: Diagnosis present

## 2021-01-15 DIAGNOSIS — E8729 Other acidosis: Secondary | ICD-10-CM | POA: Diagnosis present

## 2021-01-15 DIAGNOSIS — I083 Combined rheumatic disorders of mitral, aortic and tricuspid valves: Secondary | ICD-10-CM | POA: Diagnosis present

## 2021-01-15 DIAGNOSIS — Z79899 Other long term (current) drug therapy: Secondary | ICD-10-CM

## 2021-01-15 DIAGNOSIS — F17210 Nicotine dependence, cigarettes, uncomplicated: Secondary | ICD-10-CM | POA: Diagnosis present

## 2021-01-15 DIAGNOSIS — J449 Chronic obstructive pulmonary disease, unspecified: Secondary | ICD-10-CM | POA: Diagnosis present

## 2021-01-15 DIAGNOSIS — N289 Disorder of kidney and ureter, unspecified: Secondary | ICD-10-CM | POA: Diagnosis not present

## 2021-01-15 DIAGNOSIS — R55 Syncope and collapse: Secondary | ICD-10-CM | POA: Diagnosis present

## 2021-01-15 DIAGNOSIS — I5033 Acute on chronic diastolic (congestive) heart failure: Secondary | ICD-10-CM | POA: Diagnosis present

## 2021-01-15 DIAGNOSIS — I130 Hypertensive heart and chronic kidney disease with heart failure and stage 1 through stage 4 chronic kidney disease, or unspecified chronic kidney disease: Secondary | ICD-10-CM | POA: Diagnosis present

## 2021-01-15 DIAGNOSIS — Z66 Do not resuscitate: Secondary | ICD-10-CM | POA: Diagnosis not present

## 2021-01-15 DIAGNOSIS — I480 Paroxysmal atrial fibrillation: Secondary | ICD-10-CM | POA: Diagnosis present

## 2021-01-15 DIAGNOSIS — Z85820 Personal history of malignant melanoma of skin: Secondary | ICD-10-CM

## 2021-01-15 DIAGNOSIS — I252 Old myocardial infarction: Secondary | ICD-10-CM

## 2021-01-15 DIAGNOSIS — I2510 Atherosclerotic heart disease of native coronary artery without angina pectoris: Secondary | ICD-10-CM | POA: Diagnosis present

## 2021-01-15 DIAGNOSIS — Z880 Allergy status to penicillin: Secondary | ICD-10-CM

## 2021-01-15 DIAGNOSIS — I700 Atherosclerosis of aorta: Secondary | ICD-10-CM | POA: Diagnosis present

## 2021-01-15 DIAGNOSIS — Z7901 Long term (current) use of anticoagulants: Secondary | ICD-10-CM

## 2021-01-15 DIAGNOSIS — E785 Hyperlipidemia, unspecified: Secondary | ICD-10-CM | POA: Diagnosis present

## 2021-01-15 DIAGNOSIS — Z7952 Long term (current) use of systemic steroids: Secondary | ICD-10-CM

## 2021-01-15 DIAGNOSIS — N1832 Chronic kidney disease, stage 3b: Secondary | ICD-10-CM | POA: Diagnosis present

## 2021-01-15 DIAGNOSIS — Z888 Allergy status to other drugs, medicaments and biological substances status: Secondary | ICD-10-CM

## 2021-01-15 DIAGNOSIS — E119 Type 2 diabetes mellitus without complications: Secondary | ICD-10-CM | POA: Diagnosis not present

## 2021-01-15 DIAGNOSIS — M1990 Unspecified osteoarthritis, unspecified site: Secondary | ICD-10-CM | POA: Diagnosis present

## 2021-01-15 DIAGNOSIS — Z781 Physical restraint status: Secondary | ICD-10-CM

## 2021-01-15 DIAGNOSIS — Z716 Tobacco abuse counseling: Secondary | ICD-10-CM

## 2021-01-15 DIAGNOSIS — Z886 Allergy status to analgesic agent status: Secondary | ICD-10-CM

## 2021-01-15 DIAGNOSIS — E46 Unspecified protein-calorie malnutrition: Secondary | ICD-10-CM | POA: Diagnosis present

## 2021-01-15 DIAGNOSIS — Z7984 Long term (current) use of oral hypoglycemic drugs: Secondary | ICD-10-CM

## 2021-01-15 DIAGNOSIS — H9190 Unspecified hearing loss, unspecified ear: Secondary | ICD-10-CM | POA: Diagnosis present

## 2021-01-15 DIAGNOSIS — R1310 Dysphagia, unspecified: Secondary | ICD-10-CM | POA: Diagnosis present

## 2021-01-15 DIAGNOSIS — R54 Age-related physical debility: Secondary | ICD-10-CM | POA: Diagnosis present

## 2021-01-15 DIAGNOSIS — R627 Adult failure to thrive: Secondary | ICD-10-CM | POA: Diagnosis present

## 2021-01-15 DIAGNOSIS — Z7902 Long term (current) use of antithrombotics/antiplatelets: Secondary | ICD-10-CM

## 2021-01-15 LAB — ECHOCARDIOGRAM COMPLETE
AR max vel: 1.87 cm2
AV Area VTI: 2.06 cm2
AV Area mean vel: 1.89 cm2
AV Mean grad: 7 mmHg
AV Peak grad: 13.8 mmHg
Ao pk vel: 1.86 m/s
Area-P 1/2: 2.83 cm2
Height: 69 in
S' Lateral: 3.3 cm
Weight: 2160 oz

## 2021-01-15 LAB — COMPREHENSIVE METABOLIC PANEL
ALT: 9 U/L (ref 0–44)
AST: 12 U/L — ABNORMAL LOW (ref 15–41)
Albumin: 2.6 g/dL — ABNORMAL LOW (ref 3.5–5.0)
Alkaline Phosphatase: 139 U/L — ABNORMAL HIGH (ref 38–126)
Anion gap: 8 (ref 5–15)
BUN: 20 mg/dL (ref 8–23)
CO2: 28 mmol/L (ref 22–32)
Calcium: 8.5 mg/dL — ABNORMAL LOW (ref 8.9–10.3)
Chloride: 104 mmol/L (ref 98–111)
Creatinine, Ser: 1.45 mg/dL — ABNORMAL HIGH (ref 0.61–1.24)
GFR, Estimated: 47 mL/min — ABNORMAL LOW (ref >60)
Glucose, Bld: 155 mg/dL — ABNORMAL HIGH (ref 70–99)
Potassium: 3.6 mmol/L (ref 3.5–5.1)
Sodium: 140 mmol/L (ref 135–145)
Total Bilirubin: 0.5 mg/dL (ref 0.3–1.2)
Total Protein: 5.7 g/dL — ABNORMAL LOW (ref 6.5–8.1)

## 2021-01-15 LAB — IRON AND TIBC
Iron: 74 ug/dL (ref 45–182)
Saturation Ratios: 26 % (ref 17.9–39.5)
TIBC: 281 ug/dL (ref 250–450)
UIBC: 207 ug/dL

## 2021-01-15 LAB — FOLATE: Folate: 7.8 ng/mL (ref >5.9)

## 2021-01-15 LAB — I-STAT ARTERIAL BLOOD GAS, ED
Acid-Base Excess: 3 mmol/L — ABNORMAL HIGH (ref 0.0–2.0)
Bicarbonate: 28.7 mmol/L — ABNORMAL HIGH (ref 20.0–28.0)
Calcium, Ion: 1.21 mmol/L (ref 1.15–1.40)
HCT: 18 % — ABNORMAL LOW (ref 39.0–52.0)
Hemoglobin: 6.1 g/dL — CL (ref 13.0–17.0)
O2 Saturation: 98 %
Patient temperature: 98.7
Potassium: 3.6 mmol/L (ref 3.5–5.1)
Sodium: 140 mmol/L (ref 135–145)
TCO2: 30 mmol/L (ref 22–32)
pCO2 arterial: 48 mmHg (ref 32.0–48.0)
pH, Arterial: 7.385 (ref 7.350–7.450)
pO2, Arterial: 107 mmHg (ref 83.0–108.0)

## 2021-01-15 LAB — FERRITIN: Ferritin: 278 ng/mL (ref 24–336)

## 2021-01-15 LAB — CBC WITH DIFFERENTIAL/PLATELET
Abs Immature Granulocytes: 0.13 10*3/uL — ABNORMAL HIGH (ref 0.00–0.07)
Basophils Absolute: 0.1 10*3/uL (ref 0.0–0.1)
Basophils Relative: 1 %
Eosinophils Absolute: 1.1 10*3/uL — ABNORMAL HIGH (ref 0.0–0.5)
Eosinophils Relative: 11 %
HCT: 20.6 % — ABNORMAL LOW (ref 39.0–52.0)
Hemoglobin: 6 g/dL — CL (ref 13.0–17.0)
Immature Granulocytes: 1 %
Lymphocytes Relative: 11 %
Lymphs Abs: 1.1 10*3/uL (ref 0.7–4.0)
MCH: 33.5 pg (ref 26.0–34.0)
MCHC: 29.1 g/dL — ABNORMAL LOW (ref 30.0–36.0)
MCV: 115.1 fL — ABNORMAL HIGH (ref 80.0–100.0)
Monocytes Absolute: 1.2 10*3/uL — ABNORMAL HIGH (ref 0.1–1.0)
Monocytes Relative: 11 %
Neutro Abs: 6.6 10*3/uL (ref 1.7–7.7)
Neutrophils Relative %: 65 %
Platelets: 368 10*3/uL (ref 150–400)
RBC: 1.79 MIL/uL — ABNORMAL LOW (ref 4.22–5.81)
RDW: 24.7 % — ABNORMAL HIGH (ref 11.5–15.5)
WBC: 10.2 10*3/uL (ref 4.0–10.5)
nRBC: 0.4 % — ABNORMAL HIGH (ref 0.0–0.2)

## 2021-01-15 LAB — BLOOD GAS, ARTERIAL
Acid-Base Excess: 5.5 mmol/L — ABNORMAL HIGH (ref 0.0–2.0)
Bicarbonate: 32.9 mmol/L — ABNORMAL HIGH (ref 20.0–28.0)
Drawn by: 270271
FIO2: 100
O2 Saturation: 82.6 %
Patient temperature: 37
pCO2 arterial: 83.5 mmHg (ref 32.0–48.0)
pH, Arterial: 7.219 — ABNORMAL LOW (ref 7.350–7.450)
pO2, Arterial: 59 mmHg — ABNORMAL LOW (ref 83.0–108.0)

## 2021-01-15 LAB — HEMOGLOBIN AND HEMATOCRIT, BLOOD
HCT: 26.4 % — ABNORMAL LOW (ref 39.0–52.0)
Hemoglobin: 8 g/dL — ABNORMAL LOW (ref 13.0–17.0)

## 2021-01-15 LAB — RESP PANEL BY RT-PCR (FLU A&B, COVID) ARPGX2
Influenza A by PCR: NEGATIVE
Influenza B by PCR: NEGATIVE
SARS Coronavirus 2 by RT PCR: NEGATIVE

## 2021-01-15 LAB — PREALBUMIN: Prealbumin: 9.9 mg/dL — ABNORMAL LOW (ref 18–38)

## 2021-01-15 LAB — GLUCOSE, CAPILLARY: Glucose-Capillary: 105 mg/dL — ABNORMAL HIGH (ref 70–99)

## 2021-01-15 LAB — PREPARE RBC (CROSSMATCH)

## 2021-01-15 LAB — HEMOGLOBIN A1C
Hgb A1c MFr Bld: 6.3 % — ABNORMAL HIGH (ref 4.8–5.6)
Mean Plasma Glucose: 134.11 mg/dL

## 2021-01-15 LAB — BRAIN NATRIURETIC PEPTIDE: B Natriuretic Peptide: 624.7 pg/mL — ABNORMAL HIGH (ref 0.0–100.0)

## 2021-01-15 LAB — PROTIME-INR
INR: 2 — ABNORMAL HIGH (ref 0.8–1.2)
Prothrombin Time: 22.5 seconds — ABNORMAL HIGH (ref 11.4–15.2)

## 2021-01-15 LAB — POC OCCULT BLOOD, ED: Fecal Occult Bld: NEGATIVE

## 2021-01-15 LAB — D-DIMER, QUANTITATIVE: D-Dimer, Quant: 2.64 ug/mL-FEU — ABNORMAL HIGH (ref 0.00–0.50)

## 2021-01-15 LAB — HEPARIN LEVEL (UNFRACTIONATED): Heparin Unfractionated: >1.1 IU/mL — ABNORMAL HIGH (ref 0.30–0.70)

## 2021-01-15 LAB — APTT: aPTT: 144 seconds — ABNORMAL HIGH (ref 24–36)

## 2021-01-15 LAB — VITAMIN B12: Vitamin B-12: 665 pg/mL (ref 180–914)

## 2021-01-15 LAB — CBG MONITORING, ED: Glucose-Capillary: 93 mg/dL (ref 70–99)

## 2021-01-15 MED ORDER — PANTOPRAZOLE SODIUM 40 MG IV SOLR
40.0000 mg | INTRAVENOUS | Status: DC
  Administered 2021-01-15: 11:00:00 40 mg via INTRAVENOUS
  Filled 2021-01-15: qty 40

## 2021-01-15 MED ORDER — FUROSEMIDE 10 MG/ML IJ SOLN
40.0000 mg | Freq: Two times a day (BID) | INTRAMUSCULAR | Status: DC
  Administered 2021-01-15: 22:00:00 40 mg via INTRAVENOUS
  Filled 2021-01-15: qty 4

## 2021-01-15 MED ORDER — ROSUVASTATIN CALCIUM 20 MG PO TABS
20.0000 mg | ORAL_TABLET | Freq: Every day | ORAL | Status: DC
  Filled 2021-01-15: qty 1

## 2021-01-15 MED ORDER — SIMVASTATIN 20 MG PO TABS: 20.0000 mg | ORAL_TABLET | Freq: Every day | ORAL | Status: DC

## 2021-01-15 MED ORDER — TAMSULOSIN HCL 0.4 MG PO CAPS
0.4000 mg | ORAL_CAPSULE | Freq: Every day | ORAL | Status: DC
  Administered 2021-01-15: 11:00:00 0.4 mg via ORAL
  Filled 2021-01-15: qty 1

## 2021-01-15 MED ORDER — GLUCERNA SHAKE PO LIQD
237.0000 mL | Freq: Three times a day (TID) | ORAL | Status: DC
  Administered 2021-01-15: 12:00:00 237 mL via ORAL
  Filled 2021-01-15: qty 237

## 2021-01-15 MED ORDER — LORAZEPAM 2 MG/ML IJ SOLN
0.5000 mg | Freq: Four times a day (QID) | INTRAMUSCULAR | Status: DC | PRN
  Administered 2021-01-15 – 2021-01-16 (×2): 0.5 mg via INTRAVENOUS
  Filled 2021-01-15 (×2): qty 1

## 2021-01-15 MED ORDER — INSULIN ASPART 100 UNIT/ML IJ SOLN
0.0000 [IU] | Freq: Three times a day (TID) | INTRAMUSCULAR | Status: DC
  Administered 2021-01-16: 06:00:00 1 [IU] via SUBCUTANEOUS

## 2021-01-15 MED ORDER — LIDOCAINE 5 % EX PTCH
2.0000 | MEDICATED_PATCH | CUTANEOUS | Status: DC
  Administered 2021-01-15: 14:00:00 2 via TRANSDERMAL
  Filled 2021-01-15: qty 2

## 2021-01-15 MED ORDER — FUROSEMIDE 10 MG/ML IJ SOLN
40.0000 mg | Freq: Once | INTRAMUSCULAR | Status: AC
  Administered 2021-01-15: 06:00:00 40 mg via INTRAVENOUS
  Filled 2021-01-15: qty 4

## 2021-01-15 MED ORDER — CLOPIDOGREL BISULFATE 75 MG PO TABS: 75.0000 mg | ORAL_TABLET | Freq: Every day | ORAL | Status: DC

## 2021-01-15 MED ORDER — ETOMIDATE 2 MG/ML IV SOLN
INTRAVENOUS | Status: AC
  Filled 2021-01-15: qty 10

## 2021-01-15 MED ORDER — ROCURONIUM BROMIDE 10 MG/ML (PF) SYRINGE
PREFILLED_SYRINGE | INTRAVENOUS | Status: AC
  Filled 2021-01-15: qty 10

## 2021-01-15 MED ORDER — IPRATROPIUM-ALBUTEROL 0.5-2.5 (3) MG/3ML IN SOLN
3.0000 mL | Freq: Once | RESPIRATORY_TRACT | Status: AC
  Administered 2021-01-15: 02:00:00 3 mL via RESPIRATORY_TRACT
  Filled 2021-01-15: qty 3

## 2021-01-15 MED ORDER — MIDAZOLAM HCL 2 MG/2ML IJ SOLN
INTRAMUSCULAR | Status: AC
  Filled 2021-01-15: qty 2

## 2021-01-15 MED ORDER — SUCCINYLCHOLINE CHLORIDE 200 MG/10ML IV SOSY
PREFILLED_SYRINGE | INTRAVENOUS | Status: AC
  Filled 2021-01-15: qty 10

## 2021-01-15 MED ORDER — SODIUM CHLORIDE 0.9% FLUSH
3.0000 mL | Freq: Two times a day (BID) | INTRAVENOUS | Status: DC
  Administered 2021-01-15: 23:00:00 3 mL via INTRAVENOUS

## 2021-01-15 MED ORDER — HYDROCODONE-ACETAMINOPHEN 5-325 MG PO TABS
1.0000 | ORAL_TABLET | Freq: Four times a day (QID) | ORAL | Status: DC | PRN
  Filled 2021-01-15: qty 1

## 2021-01-15 MED ORDER — HEPARIN (PORCINE) 25000 UT/250ML-% IV SOLN
850.0000 [IU]/h | INTRAVENOUS | Status: DC
  Administered 2021-01-15: 12:00:00 850 [IU]/h via INTRAVENOUS
  Filled 2021-01-15: qty 250

## 2021-01-15 MED ORDER — CALCITONIN (SALMON) 200 UNIT/ACT NA SOLN
1.0000 | Freq: Every day | NASAL | Status: DC
  Filled 2021-01-15: qty 3.7

## 2021-01-15 MED ORDER — PERFLUTREN LIPID MICROSPHERE
1.0000 mL | INTRAVENOUS | Status: AC | PRN
  Administered 2021-01-15: 13:00:00 2 mL via INTRAVENOUS
  Filled 2021-01-15: qty 10

## 2021-01-15 MED ORDER — AMIODARONE HCL 200 MG PO TABS
200.0000 mg | ORAL_TABLET | Freq: Every day | ORAL | Status: DC
  Administered 2021-01-15: 11:00:00 200 mg via ORAL
  Filled 2021-01-15: qty 1

## 2021-01-15 MED ORDER — ALBUTEROL SULFATE (2.5 MG/3ML) 0.083% IN NEBU
INHALATION_SOLUTION | RESPIRATORY_TRACT | Status: AC
  Filled 2021-01-15: qty 3

## 2021-01-15 MED ORDER — IOHEXOL 350 MG/ML SOLN
80.0000 mL | Freq: Once | INTRAVENOUS | Status: AC | PRN
  Administered 2021-01-15: 05:00:00 80 mL via INTRAVENOUS

## 2021-01-15 MED ORDER — FENTANYL CITRATE PF 50 MCG/ML IJ SOSY
PREFILLED_SYRINGE | INTRAMUSCULAR | Status: AC
  Filled 2021-01-15: qty 2

## 2021-01-15 MED ORDER — FUROSEMIDE 10 MG/ML IJ SOLN: 40.0000 mg | Freq: Once | INTRAMUSCULAR | Status: AC

## 2021-01-15 MED ORDER — FUROSEMIDE 10 MG/ML IJ SOLN
INTRAMUSCULAR | Status: AC
  Administered 2021-01-15: 23:00:00 40 mg via INTRAVENOUS
  Filled 2021-01-15: qty 4

## 2021-01-15 MED ORDER — SODIUM CHLORIDE 0.9 % IV SOLN
10.0000 mL/h | Freq: Once | INTRAVENOUS | Status: AC
  Administered 2021-01-15: 05:00:00 10 mL/h via INTRAVENOUS

## 2021-01-15 NOTE — Progress Notes (Signed)
HOSPITAL MEDICINE OVERNIGHT EVENT NOTE    Notified by nursing and rapid response that patient's respiratory status has deteriorated in the past several hours.  ABG obtained, pH 7.219, PCO2 of 83.5, PaO2 of 59 on nonrebreather mask.  Chest x-ray obtained revealing progressively worsening substantial pulmonary edema.  I promptly went to go evaluate the patient at the bedside.  Patient is extremely lethargic and minimally responsive to pain.  Lung sounds reveal coarse rhonchi bilaterally, markedly diminished at the bases.  Patient is not a BiPAP candidate due to severe lethargy and minimal responsiveness and has also been in restraints for the majority of the day.  Considering patient's progressive pulmonary edema status post blood transfusion I am administering an additional 40 mg of intravenous Lasix.  PCCM has been contacted.  Vernelle Emerald  MD [facility]   ADDENDUM 11:40pm  After lengthy discussion with the wife who is the decision-maker for the patient via phone conversation, she has stated that the patient does not wish to receive cardiopulmonary resuscitation or mechanical ventilation.  Patient therefore will be made DNR/DNI.  PCCM has come to evaluate the patient and has recommended aggressive suctioning in addition to high flow oxygen delivery superimposed on a nonrebreather mask as well as continue with diuretics and close monitoring.  Considering the recent CODE STATUS change they recommend the patient remain in progressive care for now.  Wife is on her way to see the patient, continue to monitor the patient closely.  Sherryll Burger Andrue Dini

## 2021-01-15 NOTE — ED Triage Notes (Addendum)
Pt arrives via [facility] from home. Pt was in the bathroom, had a near syncopal event (no loc, did not hit his head) and fell (daughter heard him fall). On xarelto. On fire arrival, he was tripoding and SOB. Hx of CHF and COPD. When they were moving him from the bedroom to the living room he had some "passing out" episodes. 116sbp, paced on the monitor, 79% on RA, rales throughout, placed on NRB, improved to 100%. CBG 241. IV established in the left hand. Abdominal firmness.  Pt has skin tear to right arm and right ear-family reported to EMS that this is old and not from this event.

## 2021-01-15 NOTE — H&P (Addendum)
You have History and Physical    Lance Smith AOZ:[id number] DOB: 1931/07/09 DOA: 12/26/2020  Referring MD/NP/PA: Mitzi Hansen, MD PCP: Burnard Bunting, MD  Consultants: Ladene Artist, MD as Consulting Physician (Gastroenterology) Deboraha Sprang, MD as Consulting Physician (Cardiology) Patient coming from: Home via EMS  Chief Complaint:   I have personally briefly reviewed patient's old medical records in [place]   HPI: Lance Smith is a 87 y.o. male with medical history significant of HTN, HLD, A. fib and recurrent DVT on chronic anticoagulation, HFpEF, complete heart block s/p PM, and emphysema presents after what he reports this passing out and falling.  History is limited from the patient as he is hard of hearing and speech is difficult to understand with him speaking in short sentences due to shortness of breath.  At baseline patient uses a walker to ambulate, but is not normally on oxygen.  Remembers feeling faint as he was going into use the restroom and the next thing that he recalls was being on the floor with EMS pulling on him.  At this point he complains of pain in his back, hips, and knees.  He states that he has been feeling short of breath and chronically coughs.  Patient makes it seem as though he had been intermittently smoking although he had tried to quit in July. Started smoking again about a month, but quit again a couple weeks ago.   Additional history obtained from his wife over the phone. His daughter heard him fall around 1 AM this morning.  Day found in between the shower and the commode.  They had to call to him several times before he would respond.  He was bleeding from his arm where he already had a skin tear prior from scratching.  Wife notes that he has been progressively going downhill since his he was admitted into the hospital back in July of this year.  He has multiple fractures up and down his spine and complains of severe back pain at baseline.   His last dose of Plavix and Xarelto were yesterday to her knowledge, and he has been compliant with his torsemide taking it every other day. He has not been on prednisone since possibly in July of this year due to him having brittle bones.  Records note last colonoscopy in 2015 noted to sessile polyps 5 to 7 mm in the transverse colon and 2 sessile polyps 4 to 7 mm in the descending colon which were removed, mild diverticulosis, and moderate internal hemorrhoids.  In route with EMS O2 saturations were noted to be as low as 79% on room air with rales throughout placed on a nonrebreather with improvement of O2 saturation to 100%.  CBG 241.  Patient noted to have some abdominal firmness and had skin tears to the right arm and right ear for which family reported this was old.  ED Course: On admission into the emergency department patient was seen to be afebrile, pulse 58-75, respirations 14-26, blood pressures 98/62 119/58 and O2 saturations as low as 88% with improvement currently on 2 L nasal cannula oxygen to greater than 92%.  Labs significant for WBC 10.2, hemoglobin 6, platelets 368, BUN 20, creatinine 1.45, alkaline phosphatase 139, albumin 2.6, BNP 624.7, D-dimer 2.64, and INR 2.  Fecal occult was negative.  Iron studies were noted to be within normal limits chest x-ray noted cardiomegaly with vascular congestion and small bilateral pleural effusions.  Influenza and COVID-19 screening were negative.  CT of the head showed no acute abnormalities.  CTA of the chest, abdomen, and pelvis was obtained and noted no signs of a pulmonary embolus, no signs of bleed bleeding, cardiomegaly with left ventricular dilation, mild interstitial pulmonary edema, moderate bilateral pleural effusions concerning for CHF exacerbation, and new T5 and T11 compression fractures with 20% height loss.  The patient was typed and screened and ordered 2 units of packed red blood cells to be transfused.  Patient was given 40 mg of Lasix  IV and DuoNeb breathing treatment.  Accepted to a progressive bed.  Review of Systems  Constitutional:  Positive for malaise/fatigue. Negative for fever.  HENT:  Positive for hearing loss.   Eyes:  Negative for photophobia and pain.  Respiratory:  Positive for cough, shortness of breath and wheezing.   Cardiovascular:  Positive for leg swelling (Very little). Negative for chest pain.  Gastrointestinal:  Negative for abdominal pain and vomiting.  Genitourinary:  Negative for dysuria.  Musculoskeletal:  Positive for back pain, falls and joint pain.  Neurological:  Positive for loss of consciousness and weakness.  Endo/Heme/Allergies:  Bruises/bleeds easily.  Psychiatric/Behavioral:  Negative for substance abuse.    Past Medical History:  Diagnosis Date   Adenomatous colon polyp 01/2002   Arthritis    CKD (chronic kidney disease) stage 3, GFR 30-59 ml/min (HCC)    Complete heart block ([place])    a. 05/2011 s/p SJM Model [id number] Dual Chamber PPM   COPD (chronic obstructive pulmonary disease) (HCC)    Diabetes mellitus (HCC)    Diastolic CHF ([place])    a. 03/5359 Echo: EF 55-60%   DVT (deep venous thrombosis), unspecified laterality    a. 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   Steroid dependence ([place])    Tobacco abuse     Past Surgical History:  Procedure Laterality Date   bilateral inguinal hernia     CARDIOVERSION N/A 06/16/2018   Procedure: CARDIOVERSION;  Surgeon: Fay Records, MD;  Location: [facility] ENDOSCOPY;  Service: Cardiovascular;  Laterality: N/A;   MELANOMA EXCISION     PACEMAKER INSERTION  May 2013   PERMANENT PACEMAKER INSERTION Left 05/20/2011   Procedure: PERMANENT PACEMAKER INSERTION;  Surgeon: Deboraha Sprang, MD;  Location: [facility] CATH LAB;  Service: Cardiovascular;  Laterality: Left;   TOTAL HIP ARTHROPLASTY Right 08/10/2020   Procedure: TOTAL HIP ARTHROPLASTY ANTERIOR  APPROACH;  Surgeon: Rod Can, MD;  Location: [facility];  Service: Orthopedics;  Laterality: Right;     reports that he has been smoking cigarettes. He has been smoking an average of 2 packs per day. He has never used smokeless tobacco. He reports that he does not drink alcohol and does not use drugs.  Allergies  Allergen Reactions   Tetanus Toxoids Shortness Of Breath and Swelling   Aspirin Nausea Only and Other (See Comments)    headache   Penicillins Swelling    Did it involve swelling of the face/tongue/throat, SOB, or low BP? Yes Did it involve sudden or severe rash/hives, skin peeling, or any reaction on the inside of your mouth or nose? Unknown Did you need to seek medical attention at a hospital or doctor's office? Yes When did it last happen? 30 years ago If all above answers are NO, may proceed with cephalosporin use.     Family History  Problem Relation Age of Onset   Leukemia  Mother    Colon cancer Neg Hx     Prior to Admission medications   Medication Sig Start Date End Date Taking? Authorizing Provider  amiodarone (PACERONE) 200 MG tablet Take 1 tablet (200 mg total) by mouth daily. 08/15/20 01/03/2021 Yes Hosie Poisson, MD  clopidogrel (PLAVIX) 75 MG tablet Take 1 tablet (75 mg total) by mouth daily. 06/04/20  Yes Deboraha Sprang, MD  docusate sodium (COLACE) 100 MG capsule Take 1 capsule (100 mg total) by mouth 2 (two) times daily. 08/14/20  Yes Hosie Poisson, MD  metFORMIN (GLUCOPHAGE) 500 MG tablet Take 500 mg by mouth 2 (two) times daily with a meal. 07/13/15  Yes [provider]  Rivaroxaban (XARELTO) 15 MG TABS tablet Take 1 tablet (15 mg total) by mouth daily. 10/20/18  Yes Shirley Friar, PA-C  tamsulosin (FLOMAX) 0.4 MG CAPS capsule Take 0.4 mg by mouth daily. 02/27/18  Yes [provider]  torsemide (DEMADEX) 20 MG tablet Take 2 tablets (40 mg total) by mouth every other day. 08/14/20  Yes Hosie Poisson, MD  atorvastatin (LIPITOR) 20 MG  tablet Take 1 tablet (20 mg total) by mouth daily. 08/15/20   Hosie Poisson, MD  bisacodyl (DULCOLAX) 5 MG EC tablet Take 2 tablets (10 mg total) by mouth daily as needed for moderate constipation. 08/16/20   Hosie Poisson, MD  calcitonin, salmon, (MIACALCIN/FORTICAL) 200 UNIT/ACT nasal spray Place 1 spray into alternate nostrils daily. 11/17/20   [provider]  feeding supplement, GLUCERNA SHAKE, (GLUCERNA SHAKE) LIQD Take 237 mLs by mouth 3 (three) times daily between meals. 08/14/20   Hosie Poisson, MD  fluocinonide ointment (LIDEX) 3.15 % Apply 1 application topically 2 (two) times daily. 07/25/20   [provider]  Multiple Vitamin (MULTIVITAMIN WITH MINERALS) TABS tablet Take 1 tablet by mouth daily. Centrum Silver    [provider]  nicotine (NICODERM CQ - DOSED IN MG/24 HOURS) 21 mg/24hr patch Place 1 patch (21 mg total) onto the skin daily. 08/15/20   Hosie Poisson, MD  nitroGLYCERIN (NITROSTAT) 0.4 MG SL tablet Place 0.4 mg under the tongue every 5 (five) minutes as needed. For chest pain    [provider]  [facility] VERIO test strip 1 each by Other route See admin instructions. Once or twice daily 08/05/20   [provider]  polyethylene glycol (MIRALAX / GLYCOLAX) 17 g packet Take 17 g by mouth 2 (two) times daily. 08/14/20   Hosie Poisson, MD  predniSONE (DELTASONE) 10 MG tablet Take 10 mg by mouth daily with breakfast.    [provider]  PROAIR HFA 108 (90 Base) MCG/ACT inhaler Inhale 2 puffs into the lungs See admin instructions. Inhale 2 puffs by mouth twice daily & every 4 hours as needed for wheezing or shortness of breath. 05/05/18   [provider]  senna (SENOKOT) 8.6 MG TABS tablet Take 1 tablet (8.6 mg total) by mouth 2 (two) times daily. 08/14/20   Hosie Poisson, MD  simvastatin (ZOCOR) 20 MG tablet Take 20 mg by mouth at bedtime. 01/07/21   [provider]  triamcinolone cream (KENALOG) 0.1 % Apply 1 application  topically 2 (two) times daily. 07/09/20   [provider]    Physical Exam:  Constitutional: Elderly male who appears chronically ill Vitals:   01/14/2021 0600 01/14/2021 0715 01/04/2021 0745 01/14/2021 0750  BP: (!) 119/58 98/60 (!) 110/56 103/67  Pulse: 62 60 (!) 58 (!) 58  Resp: 20 18 (!) 23 (!) 21  Temp:  98.3 F (36.8 C)  98.4 F (36.9 C)  TempSrc:  Oral    SpO2: 100% 96% 98%   Weight:      Height:       Eyes: PERRL, crusting and puffiness around bilateral lids ENMT: Mucous membranes are moist. Posterior pharynx clear of any exudate or lesions. Edentulous.  Hard of hearing with hearing aids in place Neck: normal,  Respiratory: Tachypneic with mild intermittent wheezes.  Patient on 2 L oxygen with O2 saturations maintained.  Only able to talk in shortened sentences at this time. Cardiovascular: Regular rate and rhythm, trace lower extremity edema  Abdomen: Mildly distended abdomen, but no tenderness appreciated.  Musculoskeletal: no  cyanosis. No joint deformity upper and lower extremities.  Skin: Bruising noted of the chest wall and bilateral upper extremities. Neurologic: CN 2-12 grossly intact.  Able to move all extremities Psychiatric: Normal judgment and insight. Alert and oriented x 3. Normal mood.     Labs on Admission: I have personally reviewed following labs and imaging studies  CBC: Recent Labs  Lab 01/04/2021 0209 12/23/2020 0234  WBC 10.2  --   NEUTROABS 6.6  --   HGB 6.0* 6.1*  HCT 20.6* 18.0*  MCV 115.1*  --   PLT 368  --    Basic Metabolic Panel: Recent Labs  Lab 12/31/2020 0209 01/11/2021 0234  NA 140 140  K 3.6 3.6  CL 104  --   CO2 28  --   GLUCOSE 155*  --   BUN 20  --   CREATININE 1.45*  --   CALCIUM 8.5*  --    GFR: Estimated Creatinine Clearance: 31.1 mL/min (A) (by C-G formula based on SCr of 1.45 mg/dL (H)). Liver Function Tests: Recent Labs  Lab 01/14/2021 0209  AST 12*  ALT 9  ALKPHOS 139*  BILITOT 0.5  PROT 5.7*  ALBUMIN 2.6*    No results for input(s): LIPASE, AMYLASE in the last 168 hours. No results for input(s): AMMONIA in the last 168 hours. Coagulation Profile: Recent Labs  Lab 12/21/2020 0209  INR 2.0*   Cardiac Enzymes: No results for input(s): CKTOTAL, CKMB, CKMBINDEX, TROPONINI in the last 168 hours. BNP (last 3 results) No results for input(s): PROBNP in the last 8760 hours. HbA1C: No results for input(s): HGBA1C in the last 72 hours. CBG: No results for input(s): GLUCAP in the last 168 hours. Lipid Profile: No results for input(s): CHOL, HDL, LDLCALC, TRIG, CHOLHDL, LDLDIRECT in the last 72 hours. Thyroid Function Tests: No results for input(s): TSH, T4TOTAL, FREET4, T3FREE, THYROIDAB in the last 72 hours. Anemia Panel: Recent Labs    12/20/2020 0353  VITAMINB12 665  FOLATE 7.8  FERRITIN 278  TIBC 281  IRON 74   Urine analysis:    Component Value Date/Time   COLORURINE YELLOW 07/13/2013 1225   APPEARANCEUR CLEAR 07/13/2013 1225   LABSPEC 1.010 07/13/2013 1225   PHURINE 6.0 07/13/2013 1225   GLUCOSEU NEGATIVE 07/13/2013 1225   HGBUR MODERATE (A) 07/13/2013 1225   BILIRUBINUR NEGATIVE 07/13/2013 1225   KETONESUR NEGATIVE 07/13/2013 1225   PROTEINUR NEGATIVE 07/13/2013 1225   UROBILINOGEN 0.2 07/13/2013 1225   NITRITE NEGATIVE 07/13/2013 1225   LEUKOCYTESUR TRACE (A) 07/13/2013 1225   Sepsis Labs: Recent Results (from the past 240 hour(s))  Resp Panel by RT-PCR (Flu A&B, Covid) Nasopharyngeal Swab     Status: None   Collection Time: 01/07/2021  3:27 AM   Specimen: Nasopharyngeal Swab; Nasopharyngeal(NP) swabs in vial transport medium  Result Value Ref Range Status   SARS Coronavirus 2 by RT PCR NEGATIVE NEGATIVE Final    Comment: (NOTE) SARS-CoV-2 target nucleic acids are NOT DETECTED.  The SARS-CoV-2 RNA is generally detectable in upper respiratory specimens during the acute phase of infection. The lowest concentration of SARS-CoV-2 viral copies this assay can detect is 138  copies/mL. A negative result does not preclude SARS-Cov-2 infection and should not be used as the sole basis for treatment or other patient management decisions. A negative result may occur with  improper specimen collection/handling, submission of specimen other than nasopharyngeal swab, presence of viral mutation(s) within the areas targeted by this assay, and inadequate number of viral copies(<138 copies/mL). A negative result must be combined with clinical observations, patient history, and epidemiological information. The expected result is Negative.  Fact Sheet for Patients:  EntrepreneurPulse.com.au  Fact Sheet for Healthcare Providers:  IncredibleEmployment.be  This test is no t yet approved or cleared by the [place] FDA and  has been authorized for detection and/or diagnosis of SARS-CoV-2 by FDA under an Emergency Use Authorization (EUA). This EUA will remain  in effect (meaning this test can be used) for the duration of the COVID-19 declaration under Section 564(b)(1) of the Act, 21 U.S.C.section 360bbb-3(b)(1), unless the authorization is terminated  or revoked sooner.       Influenza A by PCR NEGATIVE NEGATIVE Final   Influenza B by PCR NEGATIVE NEGATIVE Final    Comment: (NOTE) The Xpert Xpress SARS-CoV-2/FLU/RSV plus assay is intended as an aid in the diagnosis of influenza from Nasopharyngeal swab specimens and should not be used as a sole basis for treatment. Nasal washings and aspirates are unacceptable for Xpert Xpress SARS-CoV-2/FLU/RSV testing.  Fact Sheet for Patients: EntrepreneurPulse.com.au  Fact Sheet for Healthcare Providers: IncredibleEmployment.be  This test is not yet approved or cleared by the [place] FDA and has been authorized for detection and/or diagnosis of SARS-CoV-2 by FDA under an Emergency Use Authorization (EUA). This EUA will remain in effect (meaning  this test can be used) for the duration of the COVID-19 declaration under Section 564(b)(1) of the Act, 21 U.S.C. section 360bbb-3(b)(1), unless the authorization is terminated or revoked.  Performed at [facility], [place] [address], [place], [place] [address]      Radiological Exams on Admission: CT Head Wo Contrast  Result Date: 12/21/2020 CLINICAL DATA:  87 year old male with history of near syncopal event. Head trauma. On Xarelto. EXAM: CT HEAD WITHOUT CONTRAST TECHNIQUE: Contiguous axial images were obtained from the base of the skull through the vertex without intravenous contrast. COMPARISON:  No priors. FINDINGS: Brain: Moderate cerebral atrophy. Patchy and confluent areas of decreased attenuation are noted throughout the deep and periventricular white matter of the cerebral hemispheres bilaterally, compatible with chronic microvascular ischemic disease. No evidence of acute infarction, hemorrhage, hydrocephalus, extra-axial collection or mass lesion/mass effect. Vascular: No hyperdense vessel or unexpected calcification. Skull: Normal. Negative for fracture or focal lesion. Sinuses/Orbits: No acute finding. Other: None. IMPRESSION: 1. No acute intracranial abnormalities. 2. Moderate cerebral atrophy with chronic microvascular ischemic changes in the cerebral white matter, as above. Electronically Signed   By: Vinnie Langton M.D.   On: 01/02/2021 04:55   CT Angio Chest PE W and/or Wo Contrast  Result Date: 01/11/2021 CLINICAL DATA:  87 year old male with history of near syncopal event in the bathroom. Suspected pulmonary embolism. Suspected retroperitoneal bleed. EXAM: CT ANGIOGRAPHY CHEST, ABDOMEN AND PELVIS TECHNIQUE: Multidetector CT imaging through the chest, abdomen  and pelvis was performed using the standard protocol during bolus administration of intravenous contrast. Multiplanar reconstructed images and MIPs were obtained and reviewed to evaluate the vascular anatomy.  CONTRAST:  61mL OMNIPAQUE IOHEXOL 350 MG/ML SOLN COMPARISON:  Chest CTA 06/04/2020. CT the abdomen and pelvis 07/22/2016. FINDINGS: CTA CHEST FINDINGS Cardiovascular: No filling defects within the pulmonary arterial tree to suggest pulmonary embolism. Heart size is enlarged with left ventricular dilatation. There is no significant pericardial fluid, thickening or pericardial calcification. There is aortic atherosclerosis, as well as atherosclerosis of the great vessels of the mediastinum and the coronary arteries, including calcified atherosclerotic plaque in the left main, left anterior descending and right coronary arteries. Severe calcifications of the mitral annulus. Mild calcifications of the aortic valve. Left-sided pacemaker/AICD with lead tips terminating in the right atrium and right ventricular apex. Mediastinum/Nodes: No pathologically enlarged mediastinal or hilar lymph nodes. Hilar esophagus is unremarkable in appearance. No axillary lymphadenopathy. Lungs/Pleura: Moderate bilateral pleural effusions lying dependently with areas of passive subsegmental atelectasis in the dependent portions of the lungs bilaterally, most severe in the left lower lobe. Patchy areas of mild ground-glass attenuation and interlobular septal thickening are noted in the lungs suggesting very mild interstitial pulmonary edema. Mild diffuse bronchial wall thickening with mild centrilobular and paraseptal emphysema. Musculoskeletal: Compression fractures of inferior endplate of T5 and superior endplate of V61 with approximately 20% loss of height at both levels, new compared to the prior study from May 2022, but nonacute in appearance. There are no aggressive appearing lytic or blastic lesions noted in the visualized portions of the skeleton. Review of the MIP images confirms the above findings. CTA ABDOMEN AND PELVIS FINDINGS VASCULAR Aorta: Extensive atherosclerosis throughout the abdominal aorta. Normal caliber aorta without  aneurysm, dissection, vasculitis or significant stenosis. Celiac: Patent without evidence of aneurysm, dissection, vasculitis or significant stenosis. SMA: Atherosclerosis. Patent without evidence of aneurysm, dissection, vasculitis or significant stenosis. Renals: Both renal arteries are patent without evidence of aneurysm, dissection, vasculitis, fibromuscular dysplasia or significant stenosis. IMA: Patent without evidence of aneurysm, dissection, vasculitis or significant stenosis. Inflow: Patent without evidence of aneurysm, dissection, vasculitis or significant stenosis. Veins: No obvious venous abnormality within the limitations of this arterial phase study. Review of the MIP images confirms the above findings. NON-VASCULAR Hepatobiliary: No suspicious cystic or solid hepatic lesions. No intra or extrahepatic biliary ductal dilatation. Gallbladder is normal in appearance. Pancreas: No pancreatic mass. No pancreatic ductal dilatation. No pancreatic or peripancreatic fluid collections or inflammatory changes. Spleen: Unremarkable. Adrenals/Urinary Tract: Low-attenuation lesions in both kidneys, compatible with simple cysts, largest of which is exophytic extending laterally from the upper pole of the right kidney measuring 3.2 x 2.2 cm (axial image 83 of series 8). Other subcentimeter low-attenuation lesions in the kidneys are too small to definitively characterize, but are statistically likely to represent tiny cysts. Left adrenal gland is normal. There is a partially calcified right adrenal nodule measuring 2.6 x 2.2 cm (axial image 57 of series 8) which is incompletely characterized on today's examination, but stable compared to numerous prior studies dating back to 2013, presumably a benign lesions such as an adenoma. No hydroureteronephrosis. Urinary bladder is normal in appearance. Stomach/Bowel: The appearance of the stomach is normal. No pathologic dilatation of small bowel or colon. Numerous colonic  diverticulae are noted, without surrounding inflammatory changes to suggest an acute diverticulitis at this time. Normal appendix. Lymphatic: No lymphadenopathy noted in the abdomen or pelvis. Reproductive: Prostate gland and seminal vesicles are unremarkable in  appearance. Other: Small umbilical hernia containing only omental fat. No high attenuation fluid collection within the peritoneal cavity or retroperitoneum to suggest significant hemorrhage. No significant volume of ascites. No pneumoperitoneum. Musculoskeletal: Status post right hip arthroplasty. There are no aggressive appearing lytic or blastic lesions noted in the visualized portions of the skeleton. Review of the MIP images confirms the above findings. IMPRESSION: 1. No evidence of pulmonary embolism. 2. No retroperitoneal or intraperitoneal hemorrhage. 3. No acute abnormality in the abdomen or pelvis to account for the patient's symptoms. 4. Cardiomegaly with left ventricular dilatation. There is also evidence of mild interstitial pulmonary edema in the lungs and moderate bilateral pleural effusions; imaging findings concerning for potential congestive heart failure, as above 5. Colonic diverticulosis without evidence of acute diverticulitis at this time. 6. Aortic atherosclerosis, in addition to left main and three-vessel coronary artery disease. 7. New but nonacute compression fractures of T5 and T11 with 20% loss of height at both levels. 8. Additional incidental findings, as above. Electronically Signed   By: Vinnie Langton M.D.   On: 12/25/2020 05:34   DG Chest Port 1 View  Result Date: 12/21/2020 CLINICAL DATA:  Syncope.  Shortness of breath. EXAM: PORTABLE CHEST 1 VIEW COMPARISON:  Chest radiograph dated 08/16/2020. FINDINGS: Cardiomegaly with vascular congestion. Small bilateral pleural effusions with bibasilar atelectasis or infiltrate. No pneumothorax. Atherosclerotic calcification of the aorta. Left pectoral pacemaker device. No acute  osseous pathology. IMPRESSION: Cardiomegaly with vascular congestion and small bilateral pleural effusions. Electronically Signed   By: Anner Crete M.D.   On: 12/19/2020 02:45   CT Angio Abd/Pel W and/or Wo Contrast  Result Date: 12/24/2020 CLINICAL DATA:  87 year old male with history of near syncopal event in the bathroom. Suspected pulmonary embolism. Suspected retroperitoneal bleed. EXAM: CT ANGIOGRAPHY CHEST, ABDOMEN AND PELVIS TECHNIQUE: Multidetector CT imaging through the chest, abdomen and pelvis was performed using the standard protocol during bolus administration of intravenous contrast. Multiplanar reconstructed images and MIPs were obtained and reviewed to evaluate the vascular anatomy. CONTRAST:  73mL OMNIPAQUE IOHEXOL 350 MG/ML SOLN COMPARISON:  Chest CTA 06/04/2020. CT the abdomen and pelvis 07/22/2016. FINDINGS: CTA CHEST FINDINGS Cardiovascular: No filling defects within the pulmonary arterial tree to suggest pulmonary embolism. Heart size is enlarged with left ventricular dilatation. There is no significant pericardial fluid, thickening or pericardial calcification. There is aortic atherosclerosis, as well as atherosclerosis of the great vessels of the mediastinum and the coronary arteries, including calcified atherosclerotic plaque in the left main, left anterior descending and right coronary arteries. Severe calcifications of the mitral annulus. Mild calcifications of the aortic valve. Left-sided pacemaker/AICD with lead tips terminating in the right atrium and right ventricular apex. Mediastinum/Nodes: No pathologically enlarged mediastinal or hilar lymph nodes. Hilar esophagus is unremarkable in appearance. No axillary lymphadenopathy. Lungs/Pleura: Moderate bilateral pleural effusions lying dependently with areas of passive subsegmental atelectasis in the dependent portions of the lungs bilaterally, most severe in the left lower lobe. Patchy areas of mild ground-glass attenuation  and interlobular septal thickening are noted in the lungs suggesting very mild interstitial pulmonary edema. Mild diffuse bronchial wall thickening with mild centrilobular and paraseptal emphysema. Musculoskeletal: Compression fractures of inferior endplate of T5 and superior endplate of E95 with approximately 20% loss of height at both levels, new compared to the prior study from May 2022, but nonacute in appearance. There are no aggressive appearing lytic or blastic lesions noted in the visualized portions of the skeleton. Review of the MIP images confirms the above findings.  CTA ABDOMEN AND PELVIS FINDINGS VASCULAR Aorta: Extensive atherosclerosis throughout the abdominal aorta. Normal caliber aorta without aneurysm, dissection, vasculitis or significant stenosis. Celiac: Patent without evidence of aneurysm, dissection, vasculitis or significant stenosis. SMA: Atherosclerosis. Patent without evidence of aneurysm, dissection, vasculitis or significant stenosis. Renals: Both renal arteries are patent without evidence of aneurysm, dissection, vasculitis, fibromuscular dysplasia or significant stenosis. IMA: Patent without evidence of aneurysm, dissection, vasculitis or significant stenosis. Inflow: Patent without evidence of aneurysm, dissection, vasculitis or significant stenosis. Veins: No obvious venous abnormality within the limitations of this arterial phase study. Review of the MIP images confirms the above findings. NON-VASCULAR Hepatobiliary: No suspicious cystic or solid hepatic lesions. No intra or extrahepatic biliary ductal dilatation. Gallbladder is normal in appearance. Pancreas: No pancreatic mass. No pancreatic ductal dilatation. No pancreatic or peripancreatic fluid collections or inflammatory changes. Spleen: Unremarkable. Adrenals/Urinary Tract: Low-attenuation lesions in both kidneys, compatible with simple cysts, largest of which is exophytic extending laterally from the upper pole of the right  kidney measuring 3.2 x 2.2 cm (axial image 83 of series 8). Other subcentimeter low-attenuation lesions in the kidneys are too small to definitively characterize, but are statistically likely to represent tiny cysts. Left adrenal gland is normal. There is a partially calcified right adrenal nodule measuring 2.6 x 2.2 cm (axial image 57 of series 8) which is incompletely characterized on today's examination, but stable compared to numerous prior studies dating back to 2013, presumably a benign lesions such as an adenoma. No hydroureteronephrosis. Urinary bladder is normal in appearance. Stomach/Bowel: The appearance of the stomach is normal. No pathologic dilatation of small bowel or colon. Numerous colonic diverticulae are noted, without surrounding inflammatory changes to suggest an acute diverticulitis at this time. Normal appendix. Lymphatic: No lymphadenopathy noted in the abdomen or pelvis. Reproductive: Prostate gland and seminal vesicles are unremarkable in appearance. Other: Small umbilical hernia containing only omental fat. No high attenuation fluid collection within the peritoneal cavity or retroperitoneum to suggest significant hemorrhage. No significant volume of ascites. No pneumoperitoneum. Musculoskeletal: Status post right hip arthroplasty. There are no aggressive appearing lytic or blastic lesions noted in the visualized portions of the skeleton. Review of the MIP images confirms the above findings. IMPRESSION: 1. No evidence of pulmonary embolism. 2. No retroperitoneal or intraperitoneal hemorrhage. 3. No acute abnormality in the abdomen or pelvis to account for the patient's symptoms. 4. Cardiomegaly with left ventricular dilatation. There is also evidence of mild interstitial pulmonary edema in the lungs and moderate bilateral pleural effusions; imaging findings concerning for potential congestive heart failure, as above 5. Colonic diverticulosis without evidence of acute diverticulitis at this  time. 6. Aortic atherosclerosis, in addition to left main and three-vessel coronary artery disease. 7. New but nonacute compression fractures of T5 and T11 with 20% loss of height at both levels. 8. Additional incidental findings, as above. Electronically Signed   By: Vinnie Langton M.D.   On: 01/05/2021 05:34    EKG: Independently reviewed.  Paced rhythm at 71 bpm  Assessment/Plan  Syncope secondary to symptomatic anemia: Patient presents after having a syncope and collapse while going to use the bathroom at home.  Noted to have subsequent syncopal episodes with EMS.  CT scan of the head did not show any acute abnormalities.  Hemoglobin was noted to be as low as 6 g/dL on admission, but previously had been 11.8 g/dL on 08/16/2020.  Stool guaiacs were negative.  Patient's last colonoscopy in 2015 noted signs of sessile polyps which were  removed, diverticulosis, and internal hemorrhoids.  Patient does have multiple areas of bruising noted of the chest and arms with  reports of bleeding from his arm after initial fall.  Unclear exact cause of anemia at this time.  Other likely contributing factors to syncope in addition to anemia include acute respiratory failure with hypoxia secondary to CHF exacerbation.   -Admit to a progressive bed -Continue with transfusion of 2 units of packed red blood cells -Held Plavix and Xarelto -Interrogate Saint Jude pacemaker(pacemaker was interrogated reported to be functioning properly, but battery noted to be at a 25% for which elective replacement was recommended) -GI consulted, will follow-up for any further recommendations  Acute respiratory failure with hypoxia acute on chronic diastolic congestive heart failure: Normally patient is not on oxygen at baseline.  Found to have O2 saturations as low as 79% on room air with improvement on nonrebreather, currently O2 saturation maintained on 2 L of nasal cannula oxygen.  BNP was elevated at 624.7.  CT imaging negative for  any signs of a pulmonary embolus, but did note cardiomegaly with interstitial edema and moderate bilateral pleural effusions.  Last EF noted to be 60-65% with grade 1 diastolic dysfunction 08/23/7617.  Home diuretic regimen includes furosemide 40 mg every other day for which patient last took medication yesterday.  Heart failure likely provoked in the setting of anemia.  Other contributing factors to patient's hypoxia include compression fractures of the thoracic spine. -Continuous pulse oximetry with nasal cannula oxygen maintain O2 saturation greater 92% -Strict I&Os and daily weight -Lasix 40 mg IV twice daily -Check echocardiogram -DuoNebs as needed for shortness of breath/wheezing -Cardiology consulted in regards to heart failure   COPD: Patient reports that he has intermittently been wheezing.  Noted to have very mild intermittent wheezing on physical exam.  Has not been on steroids in several months due to brittle bones as reported by wife. -DuoNebs as needed for shortness of breath/wheezing  Paroxysmal atrial fibrillation and recurrent DVT on chronic anticoagulation: Patient appears to be rate controlled at this time.  His last dose of Xarelto was yesterday night.  Given history of paroxysmal atrial fibrillation and recurrent DVTs. -Continue amiodarone -Hold Xarelto -Heparin per pharmacy  Complete heart block s/p pacemaker -Per cardiology  CAD: Patient last had a low risk stress test in 07/2019, but prior MI in 2015.  He has been on Plavix since 05/2020. -Held Plavix -Continue statin  Compression fractures: Patient does complain of significant back pain for which he is unable to lay flat since hospitalized in July.  Noted to have new, but not acute compression fractures of T5 and T11 with 20% height loss on CT imaging. -Hydrocodone as needed for pain -Apply lidocaine patches to back -Will need PT evaluation once acute issues have been addressed  Renal insufficiency/possible acute  kidney injury: Patient presents with creatinine 1.45 with BUN 20.  Baseline creatinine previously 1.16 on discharge on 08/16/2020.  Suspect secondary to blood loss. -Continue to monitor kidney function daily with diuresis   Diabetes mellitus type 2: On admission glucose 155.  Last hemoglobin A1c was 7 in 07/2020.  Home medication regimen includes metformin 500 mg twice daily. -Hypoglycemic protocol -Hold metformin for least 3 days given patient received IV contrast -CBGs before every meal with sensitive SSI  Hyperlipidemia -Continue simvastatin  BPH -Continue Flomax  Protein calorie malnutrition: Albumin 2.6 on admission. -Check prealbumin  Tobacco abuse: Patient quit tobacco a couple weeks ago. -Continue to encourage cessation of tobacco use  DVT prophylaxis: Heparin Code Status: Full Family Communication: Wife updated over the phone Disposition Plan: To be Consults called: GI and cardiology Admission status: Inpatient require more than 2 midnight stay in the setting of CHF exacerbation with symptomatic anemia  Norval Morton MD [facility]   If 7PM-7AM, please contact night-coverage   12/31/2020, 8:26 AM

## 2021-01-15 NOTE — Progress Notes (Signed)
ANTICOAGULATION CONSULT NOTE - Initial Consult  Pharmacy Consult for heparin Indication: Afib  Allergies  Allergen Reactions   Tetanus Toxoids Shortness Of Breath and Swelling   Aspirin Nausea Only and Other (See Comments)    headache   Penicillins Swelling    Did it involve swelling of the face/tongue/throat, SOB, or low BP? Yes Did it involve sudden or severe rash/hives, skin peeling, or any reaction on the inside of your mouth or nose? Unknown Did you need to seek medical attention at a hospital or doctor's office? Yes When did it last happen? 30 years ago If all above answers are NO, may proceed with cephalosporin use.     Patient Measurements: Height: 5\' 9"  (175.3 cm) Weight: 61.2 kg (135 lb) IBW/kg (Calculated) : 70.7 Heparin Dosing Weight: 61.2kg  Vital Signs: Temp: 98.3 F (36.8 C) (12/29 0937) Temp Source: Oral (12/29 0715) BP: 107/79 (12/29 0937) Pulse Rate: 60 (12/29 0937)  Labs: Recent Labs    01/06/2021 0209 01/06/2021 0234  HGB 6.0* 6.1*  HCT 20.6* 18.0*  PLT 368  --   LABPROT 22.5*  --   INR 2.0*  --   CREATININE 1.45*  --     Estimated Creatinine Clearance: 31.1 mL/min (A) (by C-G formula based on SCr of 1.45 mg/dL (H)).   Medical History: Past Medical History:  Diagnosis Date   Adenomatous colon polyp 01/2002   Arthritis    CKD (chronic kidney disease) stage 3, GFR 30-59 ml/min (HCC)    Complete heart block ([place])    a. 05/2011 s/p SJM Model [id number] Dual Chamber PPM   COPD (chronic obstructive pulmonary disease) (HCC)    Diabetes mellitus (HCC)    Diastolic CHF ([place])    a. 07/348 Echo: EF 55-60%   DVT (deep venous thrombosis), unspecified laterality    a. 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

## 2021-01-15 NOTE — Consult Note (Addendum)
Consultation  Referring Provider:     Dr. Fuller Plan Primary Care Physician:  Burnard Bunting, MD Primary Gastroenterologist: Dr. Fuller Plan       Reason for Consultation:     Symptomatic anemia         HPI:   Lance Smith is a 87 y.o. male with history of chronic kidney disease stage III, complete heart block status post pacemaker, history of myocardial infarction, history of COPD with steroid dependence, type 2 diabetes, diastolic heart failure, history of DVT in 2013, history of melanoma excision, adenomatous polyps. Significant surgeries include bilateral inguinal hernia repair, pacemaker insertion, total right hip arthroplasty and melanoma excision. Patient on Xarelto 15 mg outpatient due to history of recurrent DVT and afib had conversion 2020, on amiodarone, and has nitroglycerin as needed. Follows with Dr. Adam Phenix, last seen 06/04/2020, was complaining of chest pain, cardiac versus indigestion.  Troponin was elevated, was placed on Plavix and aspirin at that time. Last dose 01/14/2021.  Patient presented to the ER with worsening dyspnea and syncopal episode.  Patient reports being in the restroom with shortness of breath and pain in his back hips and knees.  Patient was hypoxic on room air in the 70s with [place] per EMS.  Patient had negative Hemoccult card so anemia panel added.  Patient alone in the room when I spoke with him, difficult to understand but fairly good historian when spoken to loudly. Patient states he had 2 bowel movements yesterday. He has a bowel movement daily or every other day, normally soft occasional diarrhea with meat products.  Patient has not been looking at his stool so cannot tell me if there is any melena or hematochezia. He does complain of worsening dysphagia mid-esophagus to any food, complains of about a 30+ pound weight loss in 6 months, worsening indigestion versus chest pain in the past 2 to 3 weeks especially after coffee. Patient is  also had worsening chest tightness and epigastric discomfort for the last 3 weeks. Does recount night before last having palpitations with chest discomfort, took 4 nitroglycerin and finally helped. Did have a fall 3 weeks ago and states he believes he hurt his ribs, has had several falls, has compression fractures and chronic pain.   ED course:  hemoglobin on admission 6 (hemoglobin 11.8 08/16/2020,), MCV 115, white blood cell count 10.2, platelets 368, D-dimer 2.64, INR 2.0, iron 74, ferritin 278, B12 665, BNP 624.7, BUN 20 (3007/30/2022), creatinine 1.45 (1.16 08/16/2020), alkaline phosphatase 139, albumin 2.6, AST 12, ALT 9  CTA chest, AB, pelvis for suspected PE 1. No evidence of pulmonary embolism. 2. No retroperitoneal or intraperitoneal hemorrhage. 3. No acute abnormality in the abdomen or pelvis to account for the patient's symptoms. 4. Cardiomegaly with left ventricular dilatation. There is also evidence of mild interstitial pulmonary edema in the lungs and moderate bilateral pleural effusions; imaging findings concerning for potential congestive heart failure, as above 5. Colonic diverticulosis without evidence of acute diverticulitis at this time. 6. Aortic atherosclerosis, in addition to left main and three-vessel coronary artery disease. 7. New but nonacute compression fractures of T5 and T11 with 20% loss of height at both levels. 8. Additional incidental findings, as above.   Previous GI work up: Last office visit 09/29/2017 for heme positive stool, due to recent colonoscopy 2015 patient's comorbidities, opted not to pursue endoscopy at that time. 07/2013 colonoscopy showed 2 sessile polyps 5 to 7 mm in transverse colon, 2 sessile polyps 4  to 7 mm in descending colon, mild diverticulosis and moderate internal hemorrhoids.  Pathology showed tubular adenoma no high-grade dysplasia. 08/2010 colonoscopy with Dr. Fuller Plan showed mild diverticulosis, internal hemorrhoids, 5 sessile  polyps 1 being 18 mm in mid transverse colon other ones being 5 to 6 mm in cecum, ascending colon, descending colon, mid transverse.  Pathology showed tubular adenoma without high-grade dysplasia Has not had EGD  Past Medical History:  Diagnosis Date   Adenomatous colon polyp 01/2002   Arthritis    CKD (chronic kidney disease) stage 3, GFR 30-59 ml/min (HCC)    Complete heart block ([place])    a. 05/2011 s/p SJM Model [id number] Dual Chamber PPM   COPD (chronic obstructive pulmonary disease) (HCC)    Diabetes mellitus (HCC)    Diastolic CHF ([place])    a. 07/8293 Echo: EF 55-60%   DVT (deep venous thrombosis), unspecified laterality    a. previously on coumadin - d/c'd spring of 2013   Hyperglycemia    Hyperlipidemia    Hypertension    Inferior myocardial infarction ([place]) 08/22/2013   Steroid dependence ([place])    Tobacco abuse     Surgical History:  He  has a past surgical history that includes bilateral inguinal hernia; Pacemaker insertion (May 2013); permanent pacemaker insertion (Left, 05/20/2011); Melanoma excision; Cardioversion (N/A, 06/16/2018); and Total hip arthroplasty (Right, 08/10/2020). Family History:  His family history includes Leukemia in his mother. Social History:   reports that he has been smoking cigarettes. He has been smoking an average of 2 packs per day. He has never used smokeless tobacco. He reports that he does not drink alcohol and does not use drugs.  Prior to Admission medications   Medication Sig Start Date End Date Taking? Authorizing Provider  amiodarone (PACERONE) 200 MG tablet Take 1 tablet (200 mg total) by mouth daily. 08/15/20 01/11/2021 Yes Hosie Poisson, MD  calcitonin, salmon, (MIACALCIN/FORTICAL) 200 UNIT/ACT nasal spray Place 1 spray into alternate nostrils daily. 11/17/20  Yes [provider]  clopidogrel (PLAVIX) 75 MG tablet Take 1 tablet (75 mg total) by mouth daily. 06/04/20  Yes Deboraha Sprang, MD  feeding supplement, GLUCERNA SHAKE, (GLUCERNA  SHAKE) LIQD Take 237 mLs by mouth 3 (three) times daily between meals. 08/14/20  Yes Hosie Poisson, MD  metFORMIN (GLUCOPHAGE) 500 MG tablet Take 500 mg by mouth 2 (two) times daily with a meal. 07/13/15  Yes [provider]  nitroGLYCERIN (NITROSTAT) 0.4 MG SL tablet Place 0.4 mg under the tongue every 5 (five) minutes as needed. For chest pain   Yes [provider]  [facility] VERIO test strip 1 each by Other route See admin instructions. Once or twice daily 08/05/20  Yes [provider]  PROAIR HFA 108 (90 Base) MCG/ACT inhaler Inhale 2 puffs into the lungs See admin instructions. Inhale 2 puffs by mouth twice daily & every 4 hours as needed for wheezing or shortness of breath. 05/05/18  Yes [provider]  Rivaroxaban (XARELTO) 15 MG TABS tablet Take 1 tablet (15 mg total) by mouth daily. 10/20/18  Yes Shirley Friar, PA-C  simvastatin (ZOCOR) 20 MG tablet Take 20 mg by mouth at bedtime. 01/07/21  Yes [provider]  tamsulosin (FLOMAX) 0.4 MG CAPS capsule Take 0.4 mg by mouth daily. 02/27/18  Yes [provider]  torsemide (DEMADEX) 20 MG tablet Take 2 tablets (40 mg total) by mouth every other day. 08/14/20  Yes Hosie Poisson, MD  nicotine (NICODERM CQ - DOSED IN MG/24  HOURS) 21 mg/24hr patch Place 1 patch (21 mg total) onto the skin daily. Patient not taking: Reported on 12/26/2020 08/15/20   Hosie Poisson, MD    Current Facility-Administered Medications  Medication Dose Route Frequency Provider Last Rate Last Admin   amiodarone (PACERONE) tablet 200 mg  200 mg Oral Daily Tamala Julian, Rondell A, MD       calcitonin (salmon) (MIACALCIN/FORTICAL) nasal spray 1 spray  1 spray Alternating Nares Daily Smith, Rondell A, MD       clopidogrel (PLAVIX) tablet 75 mg  75 mg Oral Daily Smith, Rondell A, MD       feeding supplement (GLUCERNA SHAKE) (GLUCERNA SHAKE) liquid 237 mL  237 mL Oral TID BM Smith, Rondell A, MD       furosemide (LASIX) injection  40 mg  40 mg Intravenous BID Smith, Rondell A, MD       insulin aspart (novoLOG) injection 0-9 Units  0-9 Units Subcutaneous TID WC Smith, Rondell A, MD       simvastatin (ZOCOR) tablet 20 mg  20 mg Oral QHS Smith, Rondell A, MD       sodium chloride flush (NS) 0.9 % injection 3 mL  3 mL Intravenous Q12H Smith, Rondell A, MD       tamsulosin (FLOMAX) capsule 0.4 mg  0.4 mg Oral Daily Fuller Plan A, MD       Current Outpatient Medications  Medication Sig Dispense Refill   amiodarone (PACERONE) 200 MG tablet Take 1 tablet (200 mg total) by mouth daily. 30 tablet 3   calcitonin, salmon, (MIACALCIN/FORTICAL) 200 UNIT/ACT nasal spray Place 1 spray into alternate nostrils daily.     clopidogrel (PLAVIX) 75 MG tablet Take 1 tablet (75 mg total) by mouth daily. 90 tablet 3   feeding supplement, GLUCERNA SHAKE, (GLUCERNA SHAKE) LIQD Take 237 mLs by mouth 3 (three) times daily between meals.  0   metFORMIN (GLUCOPHAGE) 500 MG tablet Take 500 mg by mouth 2 (two) times daily with a meal.     nitroGLYCERIN (NITROSTAT) 0.4 MG SL tablet Place 0.4 mg under the tongue every 5 (five) minutes as needed. For chest pain     ONETOUCH VERIO test strip 1 each by Other route See admin instructions. Once or twice daily     PROAIR HFA 108 (90 Base) MCG/ACT inhaler Inhale 2 puffs into the lungs See admin instructions. Inhale 2 puffs by mouth twice daily & every 4 hours as needed for wheezing or shortness of breath.     Rivaroxaban (XARELTO) 15 MG TABS tablet Take 1 tablet (15 mg total) by mouth daily. 90 tablet 3   simvastatin (ZOCOR) 20 MG tablet Take 20 mg by mouth at bedtime.     tamsulosin (FLOMAX) 0.4 MG CAPS capsule Take 0.4 mg by mouth daily.     torsemide (DEMADEX) 20 MG tablet Take 2 tablets (40 mg total) by mouth every other day.     nicotine (NICODERM CQ - DOSED IN MG/24 HOURS) 21 mg/24hr patch Place 1 patch (21 mg total) onto the skin daily. (Patient not taking: Reported on 12/31/2020) 28 patch 0     Allergies as of 01/08/2021 - Review Complete 01/17/2021  Allergen Reaction Noted   Tetanus toxoids Shortness Of Breath and Swelling 08/06/2010   Aspirin Nausea Only and Other (See Comments) 08/06/2010   Penicillins Swelling 08/06/2010    Review of Systems  Constitutional:  Positive for malaise/fatigue and weight loss. Negative for chills and fever.  HENT:  Positive for  hearing loss.   Respiratory:  Positive for shortness of breath and wheezing. Negative for sputum production.   Cardiovascular:  Positive for chest pain, palpitations and leg swelling.  Gastrointestinal:  Positive for heartburn. Negative for abdominal pain, blood in stool, constipation, diarrhea, melena, nausea and vomiting.  Musculoskeletal:  Positive for falls.  Skin:  Negative for rash.  Neurological:  Positive for weakness. Negative for focal weakness.  Endo/Heme/Allergies:  Bruises/bleeds easily.  Psychiatric/Behavioral:  Negative for memory loss.       Physical Exam:  Vital signs in last 24 hours: Temp:  [97 F (36.1 C)-98.4 F (36.9 C)] 98.3 F (36.8 C) (12/29 0937) Pulse Rate:  [58-75] 60 (12/29 0937) Resp:  [14-26] 22 (12/29 0937) BP: (98-119)/(39-95) 107/79 (12/29 0937) SpO2:  [88 %-100 %] 98 % (12/29 0745) Weight:  [61.2 kg] 61.2 kg (12/29 0158)    General:   Elderly appearing male, hard of hearing, no acute distress, appears uncomfortable Head:  Normocephalic and atraumatic, poor dentition. Eyes: sclerae anicteric,conjunctive pale  Heart:  regular rate and rhythm, no murmurs or gallops, pacemaker left upper chest Pulm: Clear anteriorly; no wheezing, [place] in place Abdomen:  Soft, Obese AB, Normal bowel sounds. no tenderness Without guarding and Without rebound, without hepatomegaly. Extremities:  With edema. Msk:  Symmetrical without gross deformities. Peripheral pulses intact.  Neurologic:  Alert and  oriented x4;  grossly normal neurologically. Skin:   Ecchymosis bilateral arms. Psychiatric:  Demonstrates good judgement and reason without abnormal affect or behaviors, poor dentition, no dentures, difficult to understand at times  LAB RESULTS: Recent Labs    01/12/2021 0209 01/07/2021 0234  WBC 10.2  --   HGB 6.0* 6.1*  HCT 20.6* 18.0*  PLT 368  --    BMET Recent Labs    01/03/2021 0209 12/19/2020 0234  NA 140 140  K 3.6 3.6  CL 104  --   CO2 28  --   GLUCOSE 155*  --   BUN 20  --   CREATININE 1.45*  --   CALCIUM 8.5*  --    LFT Recent Labs    01/17/2021 0209  PROT 5.7*  ALBUMIN 2.6*  AST 12*  ALT 9  ALKPHOS 139*  BILITOT 0.5   PT/INR Recent Labs    01/09/2021 0209  LABPROT 22.5*  INR 2.0*    STUDIES: CT Head Wo Contrast  Result Date: 12/24/2020 CLINICAL DATA:  87 year old male with history of near syncopal event. Head trauma. On Xarelto. EXAM: CT HEAD WITHOUT CONTRAST TECHNIQUE: Contiguous axial images were obtained from the base of the skull through the vertex without intravenous contrast. COMPARISON:  No priors. FINDINGS: Brain: Moderate cerebral atrophy. Patchy and confluent areas of decreased attenuation are noted throughout the deep and periventricular white matter of the cerebral hemispheres bilaterally, compatible with chronic microvascular ischemic disease. No evidence of acute infarction, hemorrhage, hydrocephalus, extra-axial collection or mass lesion/mass effect. Vascular: No hyperdense vessel or unexpected calcification. Skull: Normal. Negative for fracture or focal lesion. Sinuses/Orbits: No acute finding. Other: None. IMPRESSION: 1. No acute intracranial abnormalities. 2. Moderate cerebral atrophy with chronic microvascular ischemic changes in the cerebral white matter, as above. Electronically Signed   By: Vinnie Langton M.D.   On: 12/25/2020 04:55   CT Angio Chest PE W and/or Wo Contrast  Result Date: 01/14/2021 CLINICAL DATA:  87 year old male with history of near syncopal event in the bathroom. Suspected pulmonary embolism. Suspected  retroperitoneal bleed. EXAM: CT ANGIOGRAPHY CHEST, ABDOMEN AND PELVIS TECHNIQUE: Multidetector CT  imaging through the chest, abdomen and pelvis was performed using the standard protocol during bolus administration of intravenous contrast. Multiplanar reconstructed images and MIPs were obtained and reviewed to evaluate the vascular anatomy. CONTRAST:  37mL OMNIPAQUE IOHEXOL 350 MG/ML SOLN COMPARISON:  Chest CTA 06/04/2020. CT the abdomen and pelvis 07/22/2016. FINDINGS: CTA CHEST FINDINGS Cardiovascular: No filling defects within the pulmonary arterial tree to suggest pulmonary embolism. Heart size is enlarged with left ventricular dilatation. There is no significant pericardial fluid, thickening or pericardial calcification. There is aortic atherosclerosis, as well as atherosclerosis of the great vessels of the mediastinum and the coronary arteries, including calcified atherosclerotic plaque in the left main, left anterior descending and right coronary arteries. Severe calcifications of the mitral annulus. Mild calcifications of the aortic valve. Left-sided pacemaker/AICD with lead tips terminating in the right atrium and right ventricular apex. Mediastinum/Nodes: No pathologically enlarged mediastinal or hilar lymph nodes. Hilar esophagus is unremarkable in appearance. No axillary lymphadenopathy. Lungs/Pleura: Moderate bilateral pleural effusions lying dependently with areas of passive subsegmental atelectasis in the dependent portions of the lungs bilaterally, most severe in the left lower lobe. Patchy areas of mild ground-glass attenuation and interlobular septal thickening are noted in the lungs suggesting very mild interstitial pulmonary edema. Mild diffuse bronchial wall thickening with mild centrilobular and paraseptal emphysema. Musculoskeletal: Compression fractures of inferior endplate of T5 and superior endplate of P10 with approximately 20% loss of height at both levels, new compared to the prior study  from May 2022, but nonacute in appearance. There are no aggressive appearing lytic or blastic lesions noted in the visualized portions of the skeleton. Review of the MIP images confirms the above findings. CTA ABDOMEN AND PELVIS FINDINGS VASCULAR Aorta: Extensive atherosclerosis throughout the abdominal aorta. Normal caliber aorta without aneurysm, dissection, vasculitis or significant stenosis. Celiac: Patent without evidence of aneurysm, dissection, vasculitis or significant stenosis. SMA: Atherosclerosis. Patent without evidence of aneurysm, dissection, vasculitis or significant stenosis. Renals: Both renal arteries are patent without evidence of aneurysm, dissection, vasculitis, fibromuscular dysplasia or significant stenosis. IMA: Patent without evidence of aneurysm, dissection, vasculitis or significant stenosis. Inflow: Patent without evidence of aneurysm, dissection, vasculitis or significant stenosis. Veins: No obvious venous abnormality within the limitations of this arterial phase study. Review of the MIP images confirms the above findings. NON-VASCULAR Hepatobiliary: No suspicious cystic or solid hepatic lesions. No intra or extrahepatic biliary ductal dilatation. Gallbladder is normal in appearance. Pancreas: No pancreatic mass. No pancreatic ductal dilatation. No pancreatic or peripancreatic fluid collections or inflammatory changes. Spleen: Unremarkable. Adrenals/Urinary Tract: Low-attenuation lesions in both kidneys, compatible with simple cysts, largest of which is exophytic extending laterally from the upper pole of the right kidney measuring 3.2 x 2.2 cm (axial image 83 of series 8). Other subcentimeter low-attenuation lesions in the kidneys are too small to definitively characterize, but are statistically likely to represent tiny cysts. Left adrenal gland is normal. There is a partially calcified right adrenal nodule measuring 2.6 x 2.2 cm (axial image 57 of series 8) which is incompletely  characterized on today's examination, but stable compared to numerous prior studies dating back to 2013, presumably a benign lesions such as an adenoma. No hydroureteronephrosis. Urinary bladder is normal in appearance. Stomach/Bowel: The appearance of the stomach is normal. No pathologic dilatation of small bowel or colon. Numerous colonic diverticulae are noted, without surrounding inflammatory changes to suggest an acute diverticulitis at this time. Normal appendix. Lymphatic: No lymphadenopathy noted in the abdomen or pelvis. Reproductive: Prostate gland and seminal  vesicles are unremarkable in appearance. Other: Small umbilical hernia containing only omental fat. No high attenuation fluid collection within the peritoneal cavity or retroperitoneum to suggest significant hemorrhage. No significant volume of ascites. No pneumoperitoneum. Musculoskeletal: Status post right hip arthroplasty. There are no aggressive appearing lytic or blastic lesions noted in the visualized portions of the skeleton. Review of the MIP images confirms the above findings. IMPRESSION: 1. No evidence of pulmonary embolism. 2. No retroperitoneal or intraperitoneal hemorrhage. 3. No acute abnormality in the abdomen or pelvis to account for the patient's symptoms. 4. Cardiomegaly with left ventricular dilatation. There is also evidence of mild interstitial pulmonary edema in the lungs and moderate bilateral pleural effusions; imaging findings concerning for potential congestive heart failure, as above 5. Colonic diverticulosis without evidence of acute diverticulitis at this time. 6. Aortic atherosclerosis, in addition to left main and three-vessel coronary artery disease. 7. New but nonacute compression fractures of T5 and T11 with 20% loss of height at both levels. 8. Additional incidental findings, as above. Electronically Signed   By: Vinnie Langton M.D.   On: 01/17/2021 05:34   DG Chest Port 1 View  Result Date:  12/26/2020 CLINICAL DATA:  Syncope.  Shortness of breath. EXAM: PORTABLE CHEST 1 VIEW COMPARISON:  Chest radiograph dated 08/16/2020. FINDINGS: Cardiomegaly with vascular congestion. Small bilateral pleural effusions with bibasilar atelectasis or infiltrate. No pneumothorax. Atherosclerotic calcification of the aorta. Left pectoral pacemaker device. No acute osseous pathology. IMPRESSION: Cardiomegaly with vascular congestion and small bilateral pleural effusions. Electronically Signed   By: Anner Crete M.D.   On: 01/13/2021 02:45   CT Angio Abd/Pel W and/or Wo Contrast  Result Date: 01/12/2021 CLINICAL DATA:  87 year old male with history of near syncopal event in the bathroom. Suspected pulmonary embolism. Suspected retroperitoneal bleed. EXAM: CT ANGIOGRAPHY CHEST, ABDOMEN AND PELVIS TECHNIQUE: Multidetector CT imaging through the chest, abdomen and pelvis was performed using the standard protocol during bolus administration of intravenous contrast. Multiplanar reconstructed images and MIPs were obtained and reviewed to evaluate the vascular anatomy. CONTRAST:  19mL OMNIPAQUE IOHEXOL 350 MG/ML SOLN COMPARISON:  Chest CTA 06/04/2020. CT the abdomen and pelvis 07/22/2016. FINDINGS: CTA CHEST FINDINGS Cardiovascular: No filling defects within the pulmonary arterial tree to suggest pulmonary embolism. Heart size is enlarged with left ventricular dilatation. There is no significant pericardial fluid, thickening or pericardial calcification. There is aortic atherosclerosis, as well as atherosclerosis of the great vessels of the mediastinum and the coronary arteries, including calcified atherosclerotic plaque in the left main, left anterior descending and right coronary arteries. Severe calcifications of the mitral annulus. Mild calcifications of the aortic valve. Left-sided pacemaker/AICD with lead tips terminating in the right atrium and right ventricular apex. Mediastinum/Nodes: No pathologically enlarged  mediastinal or hilar lymph nodes. Hilar esophagus is unremarkable in appearance. No axillary lymphadenopathy. Lungs/Pleura: Moderate bilateral pleural effusions lying dependently with areas of passive subsegmental atelectasis in the dependent portions of the lungs bilaterally, most severe in the left lower lobe. Patchy areas of mild ground-glass attenuation and interlobular septal thickening are noted in the lungs suggesting very mild interstitial pulmonary edema. Mild diffuse bronchial wall thickening with mild centrilobular and paraseptal emphysema. Musculoskeletal: Compression fractures of inferior endplate of T5 and superior endplate of O13 with approximately 20% loss of height at both levels, new compared to the prior study from May 2022, but nonacute in appearance. There are no aggressive appearing lytic or blastic lesions noted in the visualized portions of the skeleton. Review of the MIP images  confirms the above findings. CTA ABDOMEN AND PELVIS FINDINGS VASCULAR Aorta: Extensive atherosclerosis throughout the abdominal aorta. Normal caliber aorta without aneurysm, dissection, vasculitis or significant stenosis. Celiac: Patent without evidence of aneurysm, dissection, vasculitis or significant stenosis. SMA: Atherosclerosis. Patent without evidence of aneurysm, dissection, vasculitis or significant stenosis. Renals: Both renal arteries are patent without evidence of aneurysm, dissection, vasculitis, fibromuscular dysplasia or significant stenosis. IMA: Patent without evidence of aneurysm, dissection, vasculitis or significant stenosis. Inflow: Patent without evidence of aneurysm, dissection, vasculitis or significant stenosis. Veins: No obvious venous abnormality within the limitations of this arterial phase study. Review of the MIP images confirms the above findings. NON-VASCULAR Hepatobiliary: No suspicious cystic or solid hepatic lesions. No intra or extrahepatic biliary ductal dilatation. Gallbladder is  normal in appearance. Pancreas: No pancreatic mass. No pancreatic ductal dilatation. No pancreatic or peripancreatic fluid collections or inflammatory changes. Spleen: Unremarkable. Adrenals/Urinary Tract: Low-attenuation lesions in both kidneys, compatible with simple cysts, largest of which is exophytic extending laterally from the upper pole of the right kidney measuring 3.2 x 2.2 cm (axial image 83 of series 8). Other subcentimeter low-attenuation lesions in the kidneys are too small to definitively characterize, but are statistically likely to represent tiny cysts. Left adrenal gland is normal. There is a partially calcified right adrenal nodule measuring 2.6 x 2.2 cm (axial image 57 of series 8) which is incompletely characterized on today's examination, but stable compared to numerous prior studies dating back to 2013, presumably a benign lesions such as an adenoma. No hydroureteronephrosis. Urinary bladder is normal in appearance. Stomach/Bowel: The appearance of the stomach is normal. No pathologic dilatation of small bowel or colon. Numerous colonic diverticulae are noted, without surrounding inflammatory changes to suggest an acute diverticulitis at this time. Normal appendix. Lymphatic: No lymphadenopathy noted in the abdomen or pelvis. Reproductive: Prostate gland and seminal vesicles are unremarkable in appearance. Other: Small umbilical hernia containing only omental fat. No high attenuation fluid collection within the peritoneal cavity or retroperitoneum to suggest significant hemorrhage. No significant volume of ascites. No pneumoperitoneum. Musculoskeletal: Status post right hip arthroplasty. There are no aggressive appearing lytic or blastic lesions noted in the visualized portions of the skeleton. Review of the MIP images confirms the above findings. IMPRESSION: 1. No evidence of pulmonary embolism. 2. No retroperitoneal or intraperitoneal hemorrhage. 3. No acute abnormality in the abdomen or  pelvis to account for the patient's symptoms. 4. Cardiomegaly with left ventricular dilatation. There is also evidence of mild interstitial pulmonary edema in the lungs and moderate bilateral pleural effusions; imaging findings concerning for potential congestive heart failure, as above 5. Colonic diverticulosis without evidence of acute diverticulitis at this time. 6. Aortic atherosclerosis, in addition to left main and three-vessel coronary artery disease. 7. New but nonacute compression fractures of T5 and T11 with 20% loss of height at both levels. 8. Additional incidental findings, as above. Electronically Signed   By: Vinnie Langton M.D.   On: 01/11/2021 05:34     Impression    Acute on chronic symptomatic anemia  patient with negative Hemoccult in ER, normal iron, retic and pathologist smear pending  on Xarelto and Plavix (added in May 2022) last dose 12/28 colonoscopy 2015 due to +FOBT, small adenomas, diverticulosis, hemorrhoids, Has never had EGD No elevation of BUN.   Is complaining of dysphagia, weight loss, possible indigestion versus worsening angina.  HGB 6.1 (HGB 11.8 08/16/2020) MCV 115.1 Platelets 368 Iron 74 Ferritin 278 B12 665 BUN 20 (30 on 08/16/2020), creatinine 1.45 (  1.16 on 08/16/2020),  Acute on chronic diastolic CHF (congestive heart failure) (HCC) BNP 624.7 Last echo EF 94-85% grade 1 diastolic 46/27/0350  Complete heart block status post pacemaker  History of DVT on Xarelto  History of myocardial infarction 2015 Elevated troponin 05/2020, plavix added at that time. Has been having increased chest pain/epigastric pain   History of COPD steroid-dependent  Type 2 diabetes    Plan   -Patient active heart failure, episodes of syncope and chest discomfort, negative hemoccult and no obvious GI bleeding- Will do supportive care, can consider possible endoscopy later this admission(would need 4-5 days wash out from plavix) or outpatient with patient's  dysphagia/weight loss and tobacco use history, would need cardiac clearance and cardiac input prior to this.  - hold plavix and xarelto, on heparin per pharmacy -Will give Protonix 40 mg IV QD  -Can have clear liquid diet from GI standpoint. --Continue to monitor H&H with transfusion as needed to maintain hemoglobin greater than 8 given cardiac history- getting 2 PRBCs -Please obtain early AM CBC, CMET, INR  Thank you for your kind consultation, we will continue to follow.  Vladimir Crofts  12/24/2020, 9:43 AM     Attending Physician Note   I have taken a history, reviewed the chart and examined the patient. I personally saw the patient and performed a substantive portion of this encounter, including a complete performance of at least one of the key components, in conjunction with the APP. I agree with the APP's note, impression and recommendations.   *Acute on chronic macrocytic anemia with heme negative stool, dysphagia, weight loss and syncope in a patient on Xarelto and Plavix as outpatient. Iron studies, B12 and folate all normal. CT AP excluded a retroperitoneal bleed. Hold Plavix and Xarelto for now. Pantoprazole 40 mg qd. Trend CBC. Transfusions to maintain Hgb > 8. Can consider EGD after a 5 day wash out of Plavix if his clinical status substantially improves.   *Acute on chronic heart failure, acute respiratory failure, COPD, CHD w/ pacemaker   GI not planning to follow daily however we will reassess him in a few days. Please call for questions, problems in the interim.    Lucio Edward, MD [facility] See [facility], [facility], for our on call provider

## 2021-01-15 NOTE — ED Provider Notes (Signed)
[facility] EMERGENCY DEPARTMENT Provider Note   CSN: [id number] Arrival date & time: 12/26/2020  0141     History Chief Complaint  Patient presents with   Near Syncope    Lance Smith is a 87 y.o. male.  87 year old male the presents emerged from today for dyspnea.  Also had a near syncopal episode.  It sounds like the patient was in the bathroom and had a fall.  Did not syncopized at that time.  EMS was called.  On fire arrival they were transporting the patient to the living room and that is the point when he had a syncopal episode.  He was reportedly hypoxic in the 70s on room air.  Has a history of DVT/A. fib on Xarelto, just quit smoking a few weeks ago so likely has undiagnosed COPD, history of heart failure, diabetes multiple medical problems.  EMS said that her Rales and rhonchi.  Patient states he has had some productive cough recently. And worsening dyspnea.    Near Syncope      Past Medical History:  Diagnosis Date   Adenomatous colon polyp 01/2002   Arthritis    CKD (chronic kidney disease) stage 3, GFR 30-59 ml/min (HCC)    Complete heart block ([place])    a. 05/2011 s/p SJM Model [id number] Dual Chamber PPM   COPD (chronic obstructive pulmonary disease) (HCC)    Diabetes mellitus (HCC)    Diastolic CHF ([place])    a. 07/7937 Echo: EF 55-60%   DVT (deep venous thrombosis), unspecified laterality    a. previously on coumadin - d/c'd spring of 2013   Hyperglycemia    Hyperlipidemia    Hypertension    Inferior myocardial infarction ([place]) 08/22/2013   Steroid dependence ([place])    Tobacco abuse     Patient Active Problem List   Diagnosis Date Noted   Acute on chronic diastolic CHF (congestive heart failure) ([place]) 12/23/2020   Symptomatic anemia 01/01/2021   Aortic atherosclerosis ([place]) 08/07/2020   Emphysema lung ([place]) 08/07/2020   Closed right hip fracture ([place]) 08/06/2020   DVT, lower extremity, recurrent ([place]) 08/06/2020   Rash 08/06/2020    Leukocytosis 08/06/2020   Type 2 diabetes mellitus without complication ([place]) 03/00/9233   CAD (coronary artery disease),Prior IMI  08/22/2013   Inferior myocardial infarction ([place]) 08/22/2013   Hx of adenomatous colonic polyps 07/16/2013   Atrial fibrillation ([place]) 06/02/2012   Chronic diastolic CHF (congestive heart failure) ([place]) 06/29/2011   Hyperglycemia 05/24/2011   Urinary retention 05/21/2011   Steroid dependence ([place]) 05/21/2011   Pacemaker-St.Jude 05/21/2011   Complete heart block ([place]) 05/20/2011   Tobacco abuse 05/20/2011    Past Surgical History:  Procedure Laterality Date   bilateral inguinal hernia     CARDIOVERSION N/A 06/16/2018   Procedure: CARDIOVERSION;  Surgeon: Fay Records, MD;  Location: [facility];  Service: Cardiovascular;  Laterality: N/A;   MELANOMA EXCISION     PACEMAKER INSERTION  May 2013   PERMANENT PACEMAKER INSERTION Left 05/20/2011   Procedure: PERMANENT PACEMAKER INSERTION;  Surgeon: Deboraha Sprang, MD;  Location: [facility] CATH LAB;  Service: Cardiovascular;  Laterality: Left;   TOTAL HIP ARTHROPLASTY Right 08/10/2020   Procedure: TOTAL HIP ARTHROPLASTY ANTERIOR APPROACH;  Surgeon: Rod Can, MD;  Location: [facility];  Service: Orthopedics;  Laterality: Right;       Family History  Problem Relation Age of Onset   Leukemia Mother    Colon cancer Neg Hx     Social History  Tobacco Use   Smoking status: Every Day    Packs/day: 2.00    Types: Cigarettes   Smokeless tobacco: Never  Vaping Use   Vaping Use: Never used  Substance Use Topics   Alcohol use: No    Alcohol/week: 0.0 standard drinks   Drug use: No    Home Medications Prior to Admission medications   Medication Sig Start Date End Date Taking? Authorizing Provider  amiodarone (PACERONE) 200 MG tablet Take 1 tablet (200 mg total) by mouth daily. 08/15/20 12/13/20  Hosie Poisson, MD  atorvastatin (LIPITOR) 20 MG tablet Take 1 tablet (20 mg total) by mouth daily. 08/15/20   Hosie Poisson, MD  bisacodyl (DULCOLAX) 5 MG EC tablet Take 2 tablets (10 mg total) by mouth daily as needed for moderate constipation. 08/16/20   Hosie Poisson, MD  clopidogrel (PLAVIX) 75 MG tablet Take 1 tablet (75 mg total) by mouth daily. 06/04/20   Deboraha Sprang, MD  docusate sodium (COLACE) 100 MG capsule Take 1 capsule (100 mg total) by mouth 2 (two) times daily. 08/14/20   Hosie Poisson, MD  feeding supplement, GLUCERNA SHAKE, (GLUCERNA SHAKE) LIQD Take 237 mLs by mouth 3 (three) times daily between meals. 08/14/20   Hosie Poisson, MD  fluocinonide ointment (LIDEX) 4.43 % Apply 1 application topically 2 (two) times daily. 07/25/20   [provider]  metFORMIN (GLUCOPHAGE) 500 MG tablet Take 500 mg by mouth 2 (two) times daily with a meal. 07/13/15   [provider]  Multiple Vitamin (MULTIVITAMIN WITH MINERALS) TABS tablet Take 1 tablet by mouth daily. Centrum Silver    [provider]  nicotine (NICODERM CQ - DOSED IN MG/24 HOURS) 21 mg/24hr patch Place 1 patch (21 mg total) onto the skin daily. 08/15/20   Hosie Poisson, MD  nitroGLYCERIN (NITROSTAT) 0.4 MG SL tablet Place 0.4 mg under the tongue every 5 (five) minutes as needed. For chest pain    [provider]  [facility] VERIO test strip 1 each by Other route See admin instructions. Once or twice daily 08/05/20   [provider]  polyethylene glycol (MIRALAX / GLYCOLAX) 17 g packet Take 17 g by mouth 2 (two) times daily. 08/14/20   Hosie Poisson, MD  predniSONE (DELTASONE) 10 MG tablet Take 10 mg by mouth daily with breakfast.    [provider]  PROAIR HFA 108 (90 Base) MCG/ACT inhaler Inhale 2 puffs into the lungs See admin instructions. Inhale 2 puffs by mouth twice daily & every 4 hours as needed for wheezing or shortness of breath. 05/05/18   [provider]  Rivaroxaban (XARELTO) 15 MG TABS tablet Take 1 tablet (15 mg total) by mouth daily. 10/20/18   Shirley Friar, PA-C  senna  (SENOKOT) 8.6 MG TABS tablet Take 1 tablet (8.6 mg total) by mouth 2 (two) times daily. 08/14/20   Hosie Poisson, MD  tamsulosin (FLOMAX) 0.4 MG CAPS capsule Take 0.4 mg by mouth daily. 02/27/18   [provider]  torsemide (DEMADEX) 20 MG tablet Take 2 tablets (40 mg total) by mouth every other day. 08/14/20   Hosie Poisson, MD  triamcinolone cream (KENALOG) 0.1 % Apply 1 application topically 2 (two) times daily. 07/09/20   [provider]    Allergies    Tetanus toxoids, Aspirin, and Penicillins  Review of Systems   Review of Systems  Cardiovascular:  Positive for near-syncope.  All other systems reviewed and are negative.  Physical Exam Updated Vital Signs BP Marland Kitchen)  119/58    Pulse 62    Temp (!) 97 F (36.1 C) (Temporal)    Resp 20    Ht 5\' 9"  (1.753 m)    Wt 61.2 kg    SpO2 100%    BMI 19.94 kg/m   Physical Exam Vitals and nursing note reviewed.  Constitutional:      Appearance: He is well-developed.  HENT:     Head: Normocephalic and atraumatic.     Mouth/Throat:     Mouth: Mucous membranes are moist.     Pharynx: Oropharynx is clear.  Eyes:     Pupils: Pupils are equal, round, and reactive to light.  Cardiovascular:     Rate and Rhythm: Tachycardia present.  Pulmonary:     Effort: Pulmonary effort is normal. No respiratory distress.  Abdominal:     General: Abdomen is flat. There is no distension.     Palpations: There is no mass.     Tenderness: There is no abdominal tenderness.     Hernia: No hernia is present.  Musculoskeletal:        General: No swelling or tenderness. Normal range of motion.     Cervical back: Normal range of motion.  Skin:    General: Skin is warm and dry.     Findings: Bruising (diffuse) present.  Neurological:     General: No focal deficit present.     Mental Status: He is alert.     Cranial Nerves: No cranial nerve deficit.     Motor: Weakness present.    ED Results / Procedures / Treatments   Labs (all labs ordered  are listed, but only abnormal results are displayed) Labs Reviewed  COMPREHENSIVE METABOLIC PANEL - Abnormal; Notable for the following components:      Result Value   Glucose, Bld 155 (*)    Creatinine, Ser 1.45 (*)    Calcium 8.5 (*)    Total Protein 5.7 (*)    Albumin 2.6 (*)    AST 12 (*)    Alkaline Phosphatase 139 (*)    GFR, Estimated 47 (*)    All other components within normal limits  CBC WITH DIFFERENTIAL/PLATELET - Abnormal; Notable for the following components:   RBC 1.79 (*)    Hemoglobin 6.0 (*)    HCT 20.6 (*)    MCV 115.1 (*)    MCHC 29.1 (*)    RDW 24.7 (*)    nRBC 0.4 (*)    Monocytes Absolute 1.2 (*)    Eosinophils Absolute 1.1 (*)    Abs Immature Granulocytes 0.13 (*)    All other components within normal limits  D-DIMER, QUANTITATIVE - Abnormal; Notable for the following components:   D-Dimer, Quant 2.64 (*)    All other components within normal limits  BRAIN NATRIURETIC PEPTIDE - Abnormal; Notable for the following components:   B Natriuretic Peptide 624.7 (*)    All other components within normal limits  PROTIME-INR - Abnormal; Notable for the following components:   Prothrombin Time 22.5 (*)    INR 2.0 (*)    All other components within normal limits  I-STAT ARTERIAL BLOOD GAS, ED - Abnormal; Notable for the following components:   Bicarbonate 28.7 (*)    Acid-Base Excess 3.0 (*)    HCT 18.0 (*)    Hemoglobin 6.1 (*)    All other components within normal limits  RESP PANEL BY RT-PCR (FLU A&B, COVID) ARPGX2  VITAMIN B12  FOLATE  IRON AND TIBC  FERRITIN  BLOOD GAS, ARTERIAL  RETICULOCYTES  PATHOLOGIST SMEAR REVIEW  LACTATE DEHYDROGENASE  POC OCCULT BLOOD, ED  TYPE AND SCREEN  PREPARE RBC (CROSSMATCH)    EKG EKG Interpretation  Date/Time:  Thursday January 15 2021 01:53:09 EST Ventricular Rate:  71 PR Interval:  154 QRS Duration: 218 QT Interval:  538 QTC Calculation: 585 R Axis:   269 Text Interpretation: paced rhythm IVCD,  consider atypical RBBB Confirmed by Merrily Pew [phone]) on 12/23/2020 3:25:45 AM  Radiology CT Head Wo Contrast  Result Date: 01/05/2021 CLINICAL DATA:  87 year old male with history of near syncopal event. Head trauma. On Xarelto. EXAM: CT HEAD WITHOUT CONTRAST TECHNIQUE: Contiguous axial images were obtained from the base of the skull through the vertex without intravenous contrast. COMPARISON:  No priors. FINDINGS: Brain: Moderate cerebral atrophy. Patchy and confluent areas of decreased attenuation are noted throughout the deep and periventricular white matter of the cerebral hemispheres bilaterally, compatible with chronic microvascular ischemic disease. No evidence of acute infarction, hemorrhage, hydrocephalus, extra-axial collection or mass lesion/mass effect. Vascular: No hyperdense vessel or unexpected calcification. Skull: Normal. Negative for fracture or focal lesion. Sinuses/Orbits: No acute finding. Other: None. IMPRESSION: 1. No acute intracranial abnormalities. 2. Moderate cerebral atrophy with chronic microvascular ischemic changes in the cerebral white matter, as above. Electronically Signed   By: Vinnie Langton M.D.   On: 01/06/2021 04:55   CT Angio Chest PE W and/or Wo Contrast  Result Date: 12/24/2020 CLINICAL DATA:  87 year old male with history of near syncopal event in the bathroom. Suspected pulmonary embolism. Suspected retroperitoneal bleed. EXAM: CT ANGIOGRAPHY CHEST, ABDOMEN AND PELVIS TECHNIQUE: Multidetector CT imaging through the chest, abdomen and pelvis was performed using the standard protocol during bolus administration of intravenous contrast. Multiplanar reconstructed images and MIPs were obtained and reviewed to evaluate the vascular anatomy. CONTRAST:  60mL OMNIPAQUE IOHEXOL 350 MG/ML SOLN COMPARISON:  Chest CTA 06/04/2020. CT the abdomen and pelvis 07/22/2016. FINDINGS: CTA CHEST FINDINGS Cardiovascular: No filling defects within the pulmonary arterial tree to  suggest pulmonary embolism. Heart size is enlarged with left ventricular dilatation. There is no significant pericardial fluid, thickening or pericardial calcification. There is aortic atherosclerosis, as well as atherosclerosis of the great vessels of the mediastinum and the coronary arteries, including calcified atherosclerotic plaque in the left main, left anterior descending and right coronary arteries. Severe calcifications of the mitral annulus. Mild calcifications of the aortic valve. Left-sided pacemaker/AICD with lead tips terminating in the right atrium and right ventricular apex. Mediastinum/Nodes: No pathologically enlarged mediastinal or hilar lymph nodes. Hilar esophagus is unremarkable in appearance. No axillary lymphadenopathy. Lungs/Pleura: Moderate bilateral pleural effusions lying dependently with areas of passive subsegmental atelectasis in the dependent portions of the lungs bilaterally, most severe in the left lower lobe. Patchy areas of mild ground-glass attenuation and interlobular septal thickening are noted in the lungs suggesting very mild interstitial pulmonary edema. Mild diffuse bronchial wall thickening with mild centrilobular and paraseptal emphysema. Musculoskeletal: Compression fractures of inferior endplate of T5 and superior endplate of Y30 with approximately 20% loss of height at both levels, new compared to the prior study from May 2022, but nonacute in appearance. There are no aggressive appearing lytic or blastic lesions noted in the visualized portions of the skeleton. Review of the MIP images confirms the above findings. CTA ABDOMEN AND PELVIS FINDINGS VASCULAR Aorta: Extensive atherosclerosis throughout the abdominal aorta. Normal caliber aorta without aneurysm, dissection, vasculitis or significant stenosis. Celiac: Patent without evidence of aneurysm, dissection, vasculitis or significant stenosis.  SMA: Atherosclerosis. Patent without evidence of aneurysm, dissection,  vasculitis or significant stenosis. Renals: Both renal arteries are patent without evidence of aneurysm, dissection, vasculitis, fibromuscular dysplasia or significant stenosis. IMA: Patent without evidence of aneurysm, dissection, vasculitis or significant stenosis. Inflow: Patent without evidence of aneurysm, dissection, vasculitis or significant stenosis. Veins: No obvious venous abnormality within the limitations of this arterial phase study. Review of the MIP images confirms the above findings. NON-VASCULAR Hepatobiliary: No suspicious cystic or solid hepatic lesions. No intra or extrahepatic biliary ductal dilatation. Gallbladder is normal in appearance. Pancreas: No pancreatic mass. No pancreatic ductal dilatation. No pancreatic or peripancreatic fluid collections or inflammatory changes. Spleen: Unremarkable. Adrenals/Urinary Tract: Low-attenuation lesions in both kidneys, compatible with simple cysts, largest of which is exophytic extending laterally from the upper pole of the right kidney measuring 3.2 x 2.2 cm (axial image 83 of series 8). Other subcentimeter low-attenuation lesions in the kidneys are too small to definitively characterize, but are statistically likely to represent tiny cysts. Left adrenal gland is normal. There is a partially calcified right adrenal nodule measuring 2.6 x 2.2 cm (axial image 57 of series 8) which is incompletely characterized on today's examination, but stable compared to numerous prior studies dating back to 2013, presumably a benign lesions such as an adenoma. No hydroureteronephrosis. Urinary bladder is normal in appearance. Stomach/Bowel: The appearance of the stomach is normal. No pathologic dilatation of small bowel or colon. Numerous colonic diverticulae are noted, without surrounding inflammatory changes to suggest an acute diverticulitis at this time. Normal appendix. Lymphatic: No lymphadenopathy noted in the abdomen or pelvis. Reproductive: Prostate gland and  seminal vesicles are unremarkable in appearance. Other: Small umbilical hernia containing only omental fat. No high attenuation fluid collection within the peritoneal cavity or retroperitoneum to suggest significant hemorrhage. No significant volume of ascites. No pneumoperitoneum. Musculoskeletal: Status post right hip arthroplasty. There are no aggressive appearing lytic or blastic lesions noted in the visualized portions of the skeleton. Review of the MIP images confirms the above findings. IMPRESSION: 1. No evidence of pulmonary embolism. 2. No retroperitoneal or intraperitoneal hemorrhage. 3. No acute abnormality in the abdomen or pelvis to account for the patient's symptoms. 4. Cardiomegaly with left ventricular dilatation. There is also evidence of mild interstitial pulmonary edema in the lungs and moderate bilateral pleural effusions; imaging findings concerning for potential congestive heart failure, as above 5. Colonic diverticulosis without evidence of acute diverticulitis at this time. 6. Aortic atherosclerosis, in addition to left main and three-vessel coronary artery disease. 7. New but nonacute compression fractures of T5 and T11 with 20% loss of height at both levels. 8. Additional incidental findings, as above. Electronically Signed   By: Vinnie Langton M.D.   On: 01/08/2021 05:34   DG Chest Port 1 View  Result Date: 12/31/2020 CLINICAL DATA:  Syncope.  Shortness of breath. EXAM: PORTABLE CHEST 1 VIEW COMPARISON:  Chest radiograph dated 08/16/2020. FINDINGS: Cardiomegaly with vascular congestion. Small bilateral pleural effusions with bibasilar atelectasis or infiltrate. No pneumothorax. Atherosclerotic calcification of the aorta. Left pectoral pacemaker device. No acute osseous pathology. IMPRESSION: Cardiomegaly with vascular congestion and small bilateral pleural effusions. Electronically Signed   By: Anner Crete M.D.   On: 12/22/2020 02:45   CT Angio Abd/Pel W and/or Wo  Contrast  Result Date: 01/03/2021 CLINICAL DATA:  87 year old male with history of near syncopal event in the bathroom. Suspected pulmonary embolism. Suspected retroperitoneal bleed. EXAM: CT ANGIOGRAPHY CHEST, ABDOMEN AND PELVIS TECHNIQUE: Multidetector CT imaging through the  chest, abdomen and pelvis was performed using the standard protocol during bolus administration of intravenous contrast. Multiplanar reconstructed images and MIPs were obtained and reviewed to evaluate the vascular anatomy. CONTRAST:  33mL OMNIPAQUE IOHEXOL 350 MG/ML SOLN COMPARISON:  Chest CTA 06/04/2020. CT the abdomen and pelvis 07/22/2016. FINDINGS: CTA CHEST FINDINGS Cardiovascular: No filling defects within the pulmonary arterial tree to suggest pulmonary embolism. Heart size is enlarged with left ventricular dilatation. There is no significant pericardial fluid, thickening or pericardial calcification. There is aortic atherosclerosis, as well as atherosclerosis of the great vessels of the mediastinum and the coronary arteries, including calcified atherosclerotic plaque in the left main, left anterior descending and right coronary arteries. Severe calcifications of the mitral annulus. Mild calcifications of the aortic valve. Left-sided pacemaker/AICD with lead tips terminating in the right atrium and right ventricular apex. Mediastinum/Nodes: No pathologically enlarged mediastinal or hilar lymph nodes. Hilar esophagus is unremarkable in appearance. No axillary lymphadenopathy. Lungs/Pleura: Moderate bilateral pleural effusions lying dependently with areas of passive subsegmental atelectasis in the dependent portions of the lungs bilaterally, most severe in the left lower lobe. Patchy areas of mild ground-glass attenuation and interlobular septal thickening are noted in the lungs suggesting very mild interstitial pulmonary edema. Mild diffuse bronchial wall thickening with mild centrilobular and paraseptal emphysema. Musculoskeletal:  Compression fractures of inferior endplate of T5 and superior endplate of H84 with approximately 20% loss of height at both levels, new compared to the prior study from May 2022, but nonacute in appearance. There are no aggressive appearing lytic or blastic lesions noted in the visualized portions of the skeleton. Review of the MIP images confirms the above findings. CTA ABDOMEN AND PELVIS FINDINGS VASCULAR Aorta: Extensive atherosclerosis throughout the abdominal aorta. Normal caliber aorta without aneurysm, dissection, vasculitis or significant stenosis. Celiac: Patent without evidence of aneurysm, dissection, vasculitis or significant stenosis. SMA: Atherosclerosis. Patent without evidence of aneurysm, dissection, vasculitis or significant stenosis. Renals: Both renal arteries are patent without evidence of aneurysm, dissection, vasculitis, fibromuscular dysplasia or significant stenosis. IMA: Patent without evidence of aneurysm, dissection, vasculitis or significant stenosis. Inflow: Patent without evidence of aneurysm, dissection, vasculitis or significant stenosis. Veins: No obvious venous abnormality within the limitations of this arterial phase study. Review of the MIP images confirms the above findings. NON-VASCULAR Hepatobiliary: No suspicious cystic or solid hepatic lesions. No intra or extrahepatic biliary ductal dilatation. Gallbladder is normal in appearance. Pancreas: No pancreatic mass. No pancreatic ductal dilatation. No pancreatic or peripancreatic fluid collections or inflammatory changes. Spleen: Unremarkable. Adrenals/Urinary Tract: Low-attenuation lesions in both kidneys, compatible with simple cysts, largest of which is exophytic extending laterally from the upper pole of the right kidney measuring 3.2 x 2.2 cm (axial image 83 of series 8). Other subcentimeter low-attenuation lesions in the kidneys are too small to definitively characterize, but are statistically likely to represent tiny  cysts. Left adrenal gland is normal. There is a partially calcified right adrenal nodule measuring 2.6 x 2.2 cm (axial image 57 of series 8) which is incompletely characterized on today's examination, but stable compared to numerous prior studies dating back to 2013, presumably a benign lesions such as an adenoma. No hydroureteronephrosis. Urinary bladder is normal in appearance. Stomach/Bowel: The appearance of the stomach is normal. No pathologic dilatation of small bowel or colon. Numerous colonic diverticulae are noted, without surrounding inflammatory changes to suggest an acute diverticulitis at this time. Normal appendix. Lymphatic: No lymphadenopathy noted in the abdomen or pelvis. Reproductive: Prostate gland and seminal vesicles are unremarkable  in appearance. Other: Small umbilical hernia containing only omental fat. No high attenuation fluid collection within the peritoneal cavity or retroperitoneum to suggest significant hemorrhage. No significant volume of ascites. No pneumoperitoneum. Musculoskeletal: Status post right hip arthroplasty. There are no aggressive appearing lytic or blastic lesions noted in the visualized portions of the skeleton. Review of the MIP images confirms the above findings. IMPRESSION: 1. No evidence of pulmonary embolism. 2. No retroperitoneal or intraperitoneal hemorrhage. 3. No acute abnormality in the abdomen or pelvis to account for the patient's symptoms. 4. Cardiomegaly with left ventricular dilatation. There is also evidence of mild interstitial pulmonary edema in the lungs and moderate bilateral pleural effusions; imaging findings concerning for potential congestive heart failure, as above 5. Colonic diverticulosis without evidence of acute diverticulitis at this time. 6. Aortic atherosclerosis, in addition to left main and three-vessel coronary artery disease. 7. New but nonacute compression fractures of T5 and T11 with 20% loss of height at both levels. 8. Additional  incidental findings, as above. Electronically Signed   By: Vinnie Langton M.D.   On: 01/13/2021 05:34    Procedures .Critical Care Performed by: Merrily Pew, MD Authorized by: Merrily Pew, MD   Critical care provider statement:    Critical care time (minutes):  32   Critical care time was exclusive of:  Separately billable procedures and treating other patients and teaching time   Critical care was time spent personally by me on the following activities:  Development of treatment plan with patient or surrogate, evaluation of patient's response to treatment, examination of patient, obtaining history from patient or surrogate, review of old charts, re-evaluation of patient's condition, pulse oximetry, ordering and performing treatments and interventions and ordering and review of laboratory studies   Medications Ordered in ED Medications  ipratropium-albuterol (DUONEB) 0.5-2.5 (3) MG/3ML nebulizer solution 3 mL (3 mLs Nebulization Given 12/18/2020 0216)  0.9 %  sodium chloride infusion (10 mL/hr Intravenous New Bag/Given 12/26/2020 0525)  iohexol (OMNIPAQUE) 350 MG/ML injection 80 mL (80 mLs Intravenous Contrast Given 12/20/2020 0450)  furosemide (LASIX) injection 40 mg (40 mg Intravenous Given 12/24/2020 0546)    ED Course  I have reviewed the triage vital signs and the nursing notes.  Pertinent labs & imaging results that were available during my care of the patient were reviewed by me and considered in my medical decision making (see chart for details).    MDM Rules/Calculators/A&P                         Patient found to have pretty significant anemia is likely cause for some of the shortness of breath and weakness.  Also for soft blood pressures.  He also has elevated BNP and some fluid on his lungs so we will need some Lasix while he is getting transfused.  Anemia panel added on since his Hemoccult was negative.  CT scans done to evaluate for any intrathoracic or intra-abdominal  bleeding which were negative.  He had a syncopal episode with anemia so CT head was done to make sure there is no intracranial blood and this was negative as well.  Patient stable at this time.  Will discuss with hospitalist for admission  Final Clinical Impression(s) / ED Diagnoses Final diagnoses:  Anemia, unspecified type  Acute on chronic congestive heart failure, unspecified heart failure type ([place])    Rx / DC Orders ED Discharge Orders     None  Christabelle Hanzlik, Corene Cornea, MD 12/26/2020 [phone]

## 2021-01-15 NOTE — Progress Notes (Addendum)
°  Echocardiogram 2D Echocardiogram with contrast has been performed.  Lance Smith 01/14/2021, 12:35 PM

## 2021-01-15 NOTE — ED Notes (Signed)
Lunch tray at bedside. ?

## 2021-01-15 NOTE — Significant Event (Signed)
Rapid Response Event Note   Reason for Call :  Hypoxia, 82% on NRB.  Pt admitted to dept confused and oriented to self. 0.5mg  ativan was given at 2155.   Pt began to drop sats   Initial Focused Assessment:  Pt lying in bed with eyes closed. He will only grimace to deep painful stimuli. He will not move or follow commands. Pupils 4, equal, and sluggish. Lungs diminished t/o. Skin warm and dry.  T-97.8, HR-82, BP-105/62, RR-24, SpO2-82% on NRB.  40L HHFNC 100% placed on pt-SpO2 initially up to 89% but then back down to 77%. NRB added with no change to SpO2. TRH MD to bedside. PCCM consulted and came to bedside. Wife updated on pt situation and pt made DNR.   Interventions:  ABG-7.21/83.5/59/32.9 PCXR-worsening CHF HHFNC + NRB 40mg  lasix IV x 1 NTS Plan of Care:  Pt more awake after NT suctioning. His SpO2 are 85% on NRB. Pt is now DNR. Wife on way to see pt.  Continue aggressive suctioning per PCCM recommendation. Allow lasix to work. Continue to monitor pt. Call RRT if further assistance needed.   Event Summary:   MD Notified: Dr. Cyd Silence notified and came to bedside. PCCM [place] consulted and came to bedside.  Call [place], Nori [place] Anderson, RN

## 2021-01-15 NOTE — Consult Note (Addendum)
Cardiology Consultation:   Patient ID: Lance Smith MRN: [id number]; DOB: [birth year]  Admit date: 12/29/2020 Date of Consult: 01/04/2021  PCP:  Burnard Bunting, MD   [facility] Providers Cardiologist: Dr. Caryl Comes    Patient Profile:   Lance Smith is a 87 y.o. male with a hx of CHB s/p PPM known to be nearing ERI, paroxysmal atrial fibrillation requiring remote DCCV/amiodarone, recurrent DVT, CKD stage 3, chronic diastolic CHF, mild AI, HTN, HLD, tobacco abuse, COPD who is being seen 12/19/2020 for the evaluation of syncope/CHF at the request of Dr. Tamala Julian.  History of Present Illness:   Mr. Cordts follows primarily with Dr. Caryl Comes with cardiac history as above. He's also been managed for atrial fibrillation with DCCV 2020 + amiodarone with anticoagulation. Inferior MI is listed in PMH but do not see clearly defined history of CAD. This appears to have been added to [place] after a stress test in 2015 suggested an inferior defect but felt low risk with possible attenuation. He had no prior cath on file or formal diagnosis of coronary blockages. A f/u nuclear stress test 07/2019 was low risk; this also showed mildly reduced left ventricular global systolic function with profound apex-to base dyssynchrony due to RV pacing. F/u echo 08/2019 confirmed normal EF 60-65%, grade 1 DD, normal RV, moderate LAE, mild AI. He has a hx of CHB s/p PPM.  He was seen by Dr. Caryl Comes in 05/2020 for episode of chest pain. He was scheduled for a CTA to rule out dissection performed 06/04/20 which was negative with no evidence of aortic aneurysm or dissection. Atherosclerosis was noted. Troponins were checked which were elevated at 43 & 45. Dr. Caryl Comes recommended initiation of prophylactic Plavix. Our team saw him again in consultation 07/2020 for pre-op clearance for fall and hip fracture at which time the patient reported he was not taking the Plavix as he'd felt symptoms were related to MSK/fall. During that consult he was  cleared to proceed without further testing. Plavix was continued on discharge med list along with Xarelto. Pacemaker has been remotely followed by Dr. Caryl Comes and known to be approaching ERI.  He presented to [facility] with complaints of shortness of breath and fall/possible syncope, found to be profoundly anemic with Hgb of 6.0.  The patient is currently an unreliable historian. He is moaning intermittently and will not really answer more than brief answers. He appears to be extremely hard of hearing. Per admit H/P, " At baseline patient uses a walker to ambulate, but is not normally on oxygen.  Remembers feeling faint as he was going into use the restroom and the next thing that he recalls was being on the floor with EMS pulling on him.  At this point he complains of pain in his back, hips, and knees.  He states that he has been feeling short of breath and chronically coughs. Additional history obtained from his wife over the phone. His daughter heard him fall around 1 AM this morning.  Day found in between the shower and the commode.  They had to call to him several times before he would respond.  He was bleeding from his arm where he already had a skin tear prior from scratching.  Wife notes that he has been progressively going downhill since his he was admitted into the hospital back in July of this year.  He has multiple fractures up and down his spine and complains of severe back pain at baseline.  His last dose  of Plavix and Xarelto were yesterday to her knowledge, and he has been compliant with his torsemide taking it every other day. He has not been on prednisone since possibly in July of this year due to him having brittle bones." GI note also recounted chest discomfort vs indigestion for last several weeks, with chest pain and palpitations the night before last for which he took 4 SL NTG and finally helped.   Given his acute fall, EMS was summoned and he was found to be hypoxic in the 70s  with rales requiring supp O2. He does not routinely look at his stools so unclear if sx of bleeding. He had had dysphagia and 30lb weight loss in 6 months as well as indigestion. He took 4 SL NTG yesterday for some chest pain. He'd also had a recent fall and believed he'd hurt his ribs. In the ED, Labs pertinent for Hgb 6.0, Cr 1.45 (baseline 1.1-1.5), albumin 2.6, BNP 624, d-dimer 2.64, POC hemoccult negative. Per report. patient's PPM was interrogated which was negative for acute events except with 25% battery - as above, pt has f/u scheduled 01/26/21 to discuss generator change as he is known to be approaching ERI. CXR shows cardiomegaly with vascular congestion and small bilateral pleural effusions. CT head no acute abnormalities, + chronic changes. CT angio chest without PE, mild interstitial edema/pleural effusions amongst other incidental findings as noted. He got 40mg  IV Lasix in the ED. Xarelto and Plavix are on hold with heparin per pharmacy ordered. GI has seen and plans supportive care with possible endoscopy later this admission after Plavix washout. They requested cardiac clearance and cardiac input prior to proceeding. HR normal BP on the softer side. He is maintaining O2 sat on 2L.   Past Medical History:  Diagnosis Date   Adenomatous colon polyp 01/2002   Arthritis    CKD (chronic kidney disease) stage 3, GFR 30-59 ml/min (HCC)    Complete heart block ([place])    a. 05/2011 s/p SJM Model [id number] Dual Chamber PPM   COPD (chronic obstructive pulmonary disease) (HCC)    Diabetes mellitus (HCC)    Diastolic CHF ([place])    a. 02/2023 Echo: EF 55-60%   DVT (deep venous thrombosis), unspecified laterality    a. previously on coumadin - d/c'd spring of 2013   Hyperglycemia    Hyperlipidemia    Hypertension    Inferior myocardial infarction ([place]) 08/22/2013   Steroid dependence ([place])    Tobacco abuse     Past Surgical History:  Procedure Laterality Date   bilateral inguinal hernia      CARDIOVERSION N/A 06/16/2018   Procedure: CARDIOVERSION;  Surgeon: Fay Records, MD;  Location: [facility] ENDOSCOPY;  Service: Cardiovascular;  Laterality: N/A;   MELANOMA EXCISION     PACEMAKER INSERTION  May 2013   PERMANENT PACEMAKER INSERTION Left 05/20/2011   Procedure: PERMANENT PACEMAKER INSERTION;  Surgeon: Deboraha Sprang, MD;  Location: [facility] C[facility] LAB;  Service: Cardiovascular;  Laterality: Left;   TOTAL HIP ARTHROPLASTY Right 08/10/2020   Procedure: TOTAL HIP ARTHROPLASTY ANTERIOR APPROACH;  Surgeon: Rod Can, MD;  Location: [place];  Service: Orthopedics;  Laterality: Right;     Home Medications:  Prior to Admission medications   Medication Sig Start Date End Date Taking? Authorizing Provider  amiodarone (PACERONE) 200 MG tablet Take 1 tablet (200 mg total) by mouth daily. 08/15/20 01/12/2021 Yes Hosie Poisson, MD  calcitonin, salmon, (MIACALCIN/FORTICAL) 200 UNIT/ACT nasal spray Place 1 spray into alternate nostrils daily. 11/17/20  Yes [provider]  clopidogrel (PLAVIX) 75 MG tablet Take 1 tablet (75 mg total) by mouth daily. 06/04/20  Yes Deboraha Sprang, MD  feeding supplement, GLUCERNA SHAKE, (GLUCERNA SHAKE) LIQD Take 237 mLs by mouth 3 (three) times daily between meals. 08/14/20  Yes Hosie Poisson, MD  metFORMIN (GLUCOPHAGE) 500 MG tablet Take 500 mg by mouth 2 (two) times daily with a meal. 07/13/15  Yes [provider]  nitroGLYCERIN (NITROSTAT) 0.4 MG SL tablet Place 0.4 mg under the tongue every 5 (five) minutes as needed. For chest pain   Yes [provider]  [facility] VERIO test strip 1 each by Other route See admin instructions. Once or twice daily 08/05/20  Yes [provider]  PROAIR HFA 108 (90 Base) MCG/ACT inhaler Inhale 2 puffs into the lungs See admin instructions. Inhale 2 puffs by mouth twice daily & every 4 hours as needed for wheezing or shortness of breath. 05/05/18  Yes [provider]  Rivaroxaban (XARELTO) 15 MG TABS tablet  Take 1 tablet (15 mg total) by mouth daily. 10/20/18  Yes Shirley Friar, PA-C  simvastatin (ZOCOR) 20 MG tablet Take 20 mg by mouth at bedtime. 01/07/21  Yes [provider]  tamsulosin (FLOMAX) 0.4 MG CAPS capsule Take 0.4 mg by mouth daily. 02/27/18  Yes [provider]  torsemide (DEMADEX) 20 MG tablet Take 2 tablets (40 mg total) by mouth every other day. 08/14/20  Yes Hosie Poisson, MD  nicotine (NICODERM CQ - DOSED IN MG/24 HOURS) 21 mg/24hr patch Place 1 patch (21 mg total) onto the skin daily. Patient not taking: Reported on 01/01/2021 08/15/20   Hosie Poisson, MD    Inpatient Medications: Scheduled Meds:  amiodarone  200 mg Oral Daily   calcitonin (salmon)  1 spray Alternating Nares Daily   feeding supplement (GLUCERNA SHAKE)  237 mL Oral TID BM   furosemide  40 mg Intravenous BID   insulin aspart  0-9 Units Subcutaneous TID WC   lidocaine  2 patch Transdermal Q24H   pantoprazole (PROTONIX) IV  40 mg Intravenous Q24H   sodium chloride flush  3 mL Intravenous Q12H   tamsulosin  0.4 mg Oral Daily   Continuous Infusions:  heparin 850 Units/hr (01/12/2021 1145)   PRN Meds: HYDROcodone-acetaminophen, LORazepam  Allergies:    Allergies  Allergen Reactions   Tetanus Toxoids Shortness Of Breath and Swelling   Aspirin Nausea Only and Other (See Comments)    headache   Penicillins Swelling    Did it involve swelling of the face/tongue/throat, SOB, or low BP? Yes Did it involve sudden or severe rash/hives, skin peeling, or any reaction on the inside of your mouth or nose? Unknown Did you need to seek medical attention at a hospital or doctor's office? Yes When did it last happen? 30 years ago If all above answers are NO, may proceed with cephalosporin use.     Social History:   Social History   Socioeconomic History   Marital status: Married    Spouse name: Not on file   Number of children: Not on file   Years of education: Not on file   Highest  education level: Not on file  Occupational History   Occupation: retired  Tobacco Use   Smoking status: Every Day    Packs/day: 2.00    Types: Cigarettes   Smokeless tobacco: Never  Vaping Use   Vaping Use: Never used  Substance and Sexual Activity   Alcohol use: No  Alcohol/week: 0.0 standard drinks   Drug use: No   Sexual activity: Not Currently  Other Topics Concern   Not on file  Social History Narrative   Not on file   Social Determinants of Health   Financial Resource Strain: Not on file  Food Insecurity: Not on file  Transportation Needs: Not on file  Physical Activity: Not on file  Stress: Not on file  Social Connections: Not on file  Intimate Partner Violence: Not on file    Family History:    Family History  Problem Relation Age of Onset   Leukemia Mother    Colon cancer Neg Hx      ROS:  Please see the history of present illness.   All other ROS reviewed and negative.     Physical Exam/Data:   Vitals:   12/20/2020 1145 01/11/2021 1205 01/04/2021 1245 12/22/2020 1330  BP: 114/64 (!) 94/56 (!) 109/57 94/68  Pulse: 61 (!) 58 (!) 58 63  Resp: 14 18 15 17   Temp:  98.3 F (36.8 C)    TempSrc:  Oral    SpO2: 95% 96% 98% 98%  Weight:      Height:        Intake/Output Summary (Last 24 hours) at 01/14/2021 1639 Last data filed at 01/09/2021 1359 Gross per 24 hour  Intake 339.5 ml  Output 1000 ml  Net -660.5 ml   Last 3 Weights 01/09/2021 08/16/2020 08/15/2020  Weight (lbs) 135 lb 151 lb 3.8 oz 148 lb 2.4 oz  Weight (kg) 61.236 kg 68.6 kg 67.2 kg     Body mass index is 19.94 kg/m.  General: Frail chronically ill appearing WM, cachectic Head: Normocephalic, atraumatic, sclera non-icteric, no xanthomas, nares are without discharge. Neck: Negative for carotid bruits. JVP not elevated. Lungs: Coarse bilaterally to auscultation without wheezes, rales, or rhonchi. Breathing is unlabored. Heart: RRR S1 S2 without murmurs, rubs, or gallops.  Abdomen: Soft,  non-tender, non-distended with normoactive bowel sounds. No rebound/guarding. Extremities: No clubbing or cyanosis. No edema. Distal pedal pulses are 2+ and equal bilaterally. Scattered ecchymosis Neuro: Does not respond to orientation questions or instructions, moaning Psych:  Moaning   EKG:  The EKG was personally reviewed and demonstrates:  NSR V paced rhythm 71bpm  Telemetry:  Telemetry was personally reviewed and demonstrates:  NSR V pacing  Relevant CV Studies: Echo 08/2019  1. Left ventricular ejection fraction, by estimation, is 60 to 65%. The  left ventricle has normal function. The left ventricle has no regional  wall motion abnormalities. The left ventricular internal cavity size was  moderately dilated. There is mild  left ventricular hypertrophy. Left ventricular diastolic parameters are  consistent with Grade I diastolic dysfunction (impaired relaxation).   2. Right ventricular systolic function is normal. The right ventricular  size is normal.   3. Left atrial size was moderately dilated.   4. The mitral valve is normal in structure. No evidence of mitral valve  regurgitation. No evidence of mitral stenosis.   5. The aortic valve is tricuspid. Aortic valve regurgitation is mild.  Mild to moderate aortic valve sclerosis/calcification is present, without  any evidence of aortic stenosis.   6. The inferior vena cava is normal in size with greater than 50%  respiratory variability, suggesting right atrial pressure of 3 mmHg  07/2019 Nuclear Stress Test  The left ventricular ejection fraction is moderately decreased (30-44%). Nuclear stress EF: 44%. There was no ST segment deviation noted during stress. The study is normal. This  is a low risk study.   Low risk stress nuclear study with normal perfusion. Mildly reduced left ventricular global systolic function with profound apex-to base dyssynchrony due to RV pacing.   Laboratory Data:  High Sensitivity Troponin:  No  results for input(s): TROPONINIHS in the last 720 hours.   Chemistry Recent Labs  Lab 01/07/2021 0209 01/02/2021 0234  NA 140 140  K 3.6 3.6  CL 104  --   CO2 28  --   GLUCOSE 155*  --   BUN 20  --   CREATININE 1.45*  --   CALCIUM 8.5*  --   GFRNONAA 47*  --   ANIONGAP 8  --     Recent Labs  Lab 01/05/2021 0209  PROT 5.7*  ALBUMIN 2.6*  AST 12*  ALT 9  ALKPHOS 139*  BILITOT 0.5   Lipids No results for input(s): CHOL, TRIG, HDL, LABVLDL, LDLCALC, CHOLHDL in the last 168 hours.  Hematology Recent Labs  Lab 12/22/2020 0209 01/12/2021 0234  WBC 10.2  --   RBC 1.79*  --   HGB 6.0* 6.1*  HCT 20.6* 18.0*  MCV 115.1*  --   MCH 33.5  --   MCHC 29.1*  --   RDW 24.7*  --   PLT 368  --    Thyroid No results for input(s): TSH, FREET4 in the last 168 hours.  BNP Recent Labs  Lab 01/14/2021 0209  BNP 624.7*    DDimer  Recent Labs  Lab 01/09/2021 0209  DDIMER 2.64*     Radiology/Studies:  CT Head Wo Contrast  Result Date: 01/13/2021 CLINICAL DATA:  87 year old male with history of near syncopal event. Head trauma. On Xarelto. EXAM: CT HEAD WITHOUT CONTRAST TECHNIQUE: Contiguous axial images were obtained from the base of the skull through the vertex without intravenous contrast. COMPARISON:  No priors. FINDINGS: Brain: Moderate cerebral atrophy. Patchy and confluent areas of decreased attenuation are noted throughout the deep and periventricular white matter of the cerebral hemispheres bilaterally, compatible with chronic microvascular ischemic disease. No evidence of acute infarction, hemorrhage, hydrocephalus, extra-axial collection or mass lesion/mass effect. Vascular: No hyperdense vessel or unexpected calcification. Skull: Normal. Negative for fracture or focal lesion. Sinuses/Orbits: No acute finding. Other: None. IMPRESSION: 1. No acute intracranial abnormalities. 2. Moderate cerebral atrophy with chronic microvascular ischemic changes in the cerebral white matter, as above.  Electronically Signed   By: Vinnie Langton M.D.   On: 01/11/2021 04:55   CT Angio Chest PE W and/or Wo Contrast  Result Date: 01/01/2021 CLINICAL DATA:  87 year old male with history of near syncopal event in the bathroom. Suspected pulmonary embolism. Suspected retroperitoneal bleed. EXAM: CT ANGIOGRAPHY CHEST, ABDOMEN AND PELVIS TECHNIQUE: Multidetector CT imaging through the chest, abdomen and pelvis was performed using the standard protocol during bolus administration of intravenous contrast. Multiplanar reconstructed images and MIPs were obtained and reviewed to evaluate the vascular anatomy. CONTRAST:  81mL OMNIPAQUE IOHEXOL 350 MG/ML SOLN COMPARISON:  Chest CTA 06/04/2020. CT the abdomen and pelvis 07/22/2016. FINDINGS: CTA CHEST FINDINGS Cardiovascular: No filling defects within the pulmonary arterial tree to suggest pulmonary embolism. Heart size is enlarged with left ventricular dilatation. There is no significant pericardial fluid, thickening or pericardial calcification. There is aortic atherosclerosis, as well as atherosclerosis of the great vessels of the mediastinum and the coronary arteries, including calcified atherosclerotic plaque in the left main, left anterior descending and right coronary arteries. Severe calcifications of the mitral annulus. Mild calcifications of the aortic valve. Left-sided pacemaker/AICD  with lead tips terminating in the right atrium and right ventricular apex. Mediastinum/Nodes: No pathologically enlarged mediastinal or hilar lymph nodes. Hilar esophagus is unremarkable in appearance. No axillary lymphadenopathy. Lungs/Pleura: Moderate bilateral pleural effusions lying dependently with areas of passive subsegmental atelectasis in the dependent portions of the lungs bilaterally, most severe in the left lower lobe. Patchy areas of mild ground-glass attenuation and interlobular septal thickening are noted in the lungs suggesting very mild interstitial pulmonary edema.  Mild diffuse bronchial wall thickening with mild centrilobular and paraseptal emphysema. Musculoskeletal: Compression fractures of inferior endplate of T5 and superior endplate of I09 with approximately 20% loss of height at both levels, new compared to the prior study from May 2022, but nonacute in appearance. There are no aggressive appearing lytic or blastic lesions noted in the visualized portions of the skeleton. Review of the MIP images confirms the above findings. CTA ABDOMEN AND PELVIS FINDINGS VASCULAR Aorta: Extensive atherosclerosis throughout the abdominal aorta. Normal caliber aorta without aneurysm, dissection, vasculitis or significant stenosis. Celiac: Patent without evidence of aneurysm, dissection, vasculitis or significant stenosis. SMA: Atherosclerosis. Patent without evidence of aneurysm, dissection, vasculitis or significant stenosis. Renals: Both renal arteries are patent without evidence of aneurysm, dissection, vasculitis, fibromuscular dysplasia or significant stenosis. IMA: Patent without evidence of aneurysm, dissection, vasculitis or significant stenosis. Inflow: Patent without evidence of aneurysm, dissection, vasculitis or significant stenosis. Veins: No obvious venous abnormality within the limitations of this arterial phase study. Review of the MIP images confirms the above findings. NON-VASCULAR Hepatobiliary: No suspicious cystic or solid hepatic lesions. No intra or extrahepatic biliary ductal dilatation. Gallbladder is normal in appearance. Pancreas: No pancreatic mass. No pancreatic ductal dilatation. No pancreatic or peripancreatic fluid collections or inflammatory changes. Spleen: Unremarkable. Adrenals/Urinary Tract: Low-attenuation lesions in both kidneys, compatible with simple cysts, largest of which is exophytic extending laterally from the upper pole of the right kidney measuring 3.2 x 2.2 cm (axial image 83 of series 8). Other subcentimeter low-attenuation lesions in  the kidneys are too small to definitively characterize, but are statistically likely to represent tiny cysts. Left adrenal gland is normal. There is a partially calcified right adrenal nodule measuring 2.6 x 2.2 cm (axial image 57 of series 8) which is incompletely characterized on today's examination, but stable compared to numerous prior studies dating back to 2013, presumably a benign lesions such as an adenoma. No hydroureteronephrosis. Urinary bladder is normal in appearance. Stomach/Bowel: The appearance of the stomach is normal. No pathologic dilatation of small bowel or colon. Numerous colonic diverticulae are noted, without surrounding inflammatory changes to suggest an acute diverticulitis at this time. Normal appendix. Lymphatic: No lymphadenopathy noted in the abdomen or pelvis. Reproductive: Prostate gland and seminal vesicles are unremarkable in appearance. Other: Small umbilical hernia containing only omental fat. No high attenuation fluid collection within the peritoneal cavity or retroperitoneum to suggest significant hemorrhage. No significant volume of ascites. No pneumoperitoneum. Musculoskeletal: Status post right hip arthroplasty. There are no aggressive appearing lytic or blastic lesions noted in the visualized portions of the skeleton. Review of the MIP images confirms the above findings. IMPRESSION: 1. No evidence of pulmonary embolism. 2. No retroperitoneal or intraperitoneal hemorrhage. 3. No acute abnormality in the abdomen or pelvis to account for the patient's symptoms. 4. Cardiomegaly with left ventricular dilatation. There is also evidence of mild interstitial pulmonary edema in the lungs and moderate bilateral pleural effusions; imaging findings concerning for potential congestive heart failure, as above 5. Colonic diverticulosis without evidence of acute  diverticulitis at this time. 6. Aortic atherosclerosis, in addition to left main and three-vessel coronary artery disease. 7. New  but nonacute compression fractures of T5 and T11 with 20% loss of height at both levels. 8. Additional incidental findings, as above. Electronically Signed   By: Vinnie Langton M.D.   On: 12/23/2020 05:34   DG Chest Port 1 View  Result Date: 12/23/2020 CLINICAL DATA:  Syncope.  Shortness of breath. EXAM: PORTABLE CHEST 1 VIEW COMPARISON:  Chest radiograph dated 08/16/2020. FINDINGS: Cardiomegaly with vascular congestion. Small bilateral pleural effusions with bibasilar atelectasis or infiltrate. No pneumothorax. Atherosclerotic calcification of the aorta. Left pectoral pacemaker device. No acute osseous pathology. IMPRESSION: Cardiomegaly with vascular congestion and small bilateral pleural effusions. Electronically Signed   By: Anner Crete M.D.   On: 01/12/2021 02:45   CT Angio Abd/Pel W and/or Wo Contrast  Result Date: 01/10/2021 CLINICAL DATA:  87 year old male with history of near syncopal event in the bathroom. Suspected pulmonary embolism. Suspected retroperitoneal bleed. EXAM: CT ANGIOGRAPHY CHEST, ABDOMEN AND PELVIS TECHNIQUE: Multidetector CT imaging through the chest, abdomen and pelvis was performed using the standard protocol during bolus administration of intravenous contrast. Multiplanar reconstructed images and MIPs were obtained and reviewed to evaluate the vascular anatomy. CONTRAST:  33mL OMNIPAQUE IOHEXOL 350 MG/ML SOLN COMPARISON:  Chest CTA 06/04/2020. CT the abdomen and pelvis 07/22/2016. FINDINGS: CTA CHEST FINDINGS Cardiovascular: No filling defects within the pulmonary arterial tree to suggest pulmonary embolism. Heart size is enlarged with left ventricular dilatation. There is no significant pericardial fluid, thickening or pericardial calcification. There is aortic atherosclerosis, as well as atherosclerosis of the great vessels of the mediastinum and the coronary arteries, including calcified atherosclerotic plaque in the left main, left anterior descending and right  coronary arteries. Severe calcifications of the mitral annulus. Mild calcifications of the aortic valve. Left-sided pacemaker/AICD with lead tips terminating in the right atrium and right ventricular apex. Mediastinum/Nodes: No pathologically enlarged mediastinal or hilar lymph nodes. Hilar esophagus is unremarkable in appearance. No axillary lymphadenopathy. Lungs/Pleura: Moderate bilateral pleural effusions lying dependently with areas of passive subsegmental atelectasis in the dependent portions of the lungs bilaterally, most severe in the left lower lobe. Patchy areas of mild ground-glass attenuation and interlobular septal thickening are noted in the lungs suggesting very mild interstitial pulmonary edema. Mild diffuse bronchial wall thickening with mild centrilobular and paraseptal emphysema. Musculoskeletal: Compression fractures of inferior endplate of T5 and superior endplate of B26 with approximately 20% loss of height at both levels, new compared to the prior study from May 2022, but nonacute in appearance. There are no aggressive appearing lytic or blastic lesions noted in the visualized portions of the skeleton. Review of the MIP images confirms the above findings. CTA ABDOMEN AND PELVIS FINDINGS VASCULAR Aorta: Extensive atherosclerosis throughout the abdominal aorta. Normal caliber aorta without aneurysm, dissection, vasculitis or significant stenosis. Celiac: Patent without evidence of aneurysm, dissection, vasculitis or significant stenosis. SMA: Atherosclerosis. Patent without evidence of aneurysm, dissection, vasculitis or significant stenosis. Renals: Both renal arteries are patent without evidence of aneurysm, dissection, vasculitis, fibromuscular dysplasia or significant stenosis. IMA: Patent without evidence of aneurysm, dissection, vasculitis or significant stenosis. Inflow: Patent without evidence of aneurysm, dissection, vasculitis or significant stenosis. Veins: No obvious venous  abnormality within the limitations of this arterial phase study. Review of the MIP images confirms the above findings. NON-VASCULAR Hepatobiliary: No suspicious cystic or solid hepatic lesions. No intra or extrahepatic biliary ductal dilatation. Gallbladder is normal in appearance. Pancreas: No  pancreatic mass. No pancreatic ductal dilatation. No pancreatic or peripancreatic fluid collections or inflammatory changes. Spleen: Unremarkable. Adrenals/Urinary Tract: Low-attenuation lesions in both kidneys, compatible with simple cysts, largest of which is exophytic extending laterally from the upper pole of the right kidney measuring 3.2 x 2.2 cm (axial image 83 of series 8). Other subcentimeter low-attenuation lesions in the kidneys are too small to definitively characterize, but are statistically likely to represent tiny cysts. Left adrenal gland is normal. There is a partially calcified right adrenal nodule measuring 2.6 x 2.2 cm (axial image 57 of series 8) which is incompletely characterized on today's examination, but stable compared to numerous prior studies dating back to 2013, presumably a benign lesions such as an adenoma. No hydroureteronephrosis. Urinary bladder is normal in appearance. Stomach/Bowel: The appearance of the stomach is normal. No pathologic dilatation of small bowel or colon. Numerous colonic diverticulae are noted, without surrounding inflammatory changes to suggest an acute diverticulitis at this time. Normal appendix. Lymphatic: No lymphadenopathy noted in the abdomen or pelvis. Reproductive: Prostate gland and seminal vesicles are unremarkable in appearance. Other: Small umbilical hernia containing only omental fat. No high attenuation fluid collection within the peritoneal cavity or retroperitoneum to suggest significant hemorrhage. No significant volume of ascites. No pneumoperitoneum. Musculoskeletal: Status post right hip arthroplasty. There are no aggressive appearing lytic or blastic  lesions noted in the visualized portions of the skeleton. Review of the MIP images confirms the above findings. IMPRESSION: 1. No evidence of pulmonary embolism. 2. No retroperitoneal or intraperitoneal hemorrhage. 3. No acute abnormality in the abdomen or pelvis to account for the patient's symptoms. 4. Cardiomegaly with left ventricular dilatation. There is also evidence of mild interstitial pulmonary edema in the lungs and moderate bilateral pleural effusions; imaging findings concerning for potential congestive heart failure, as above 5. Colonic diverticulosis without evidence of acute diverticulitis at this time. 6. Aortic atherosclerosis, in addition to left main and three-vessel coronary artery disease. 7. New but nonacute compression fractures of T5 and T11 with 20% loss of height at both levels. 8. Additional incidental findings, as above. Electronically Signed   By: Vinnie Langton M.D.   On: 01/06/2021 05:34     Assessment and Plan:   1. Fall vs syncope at home suspected due to symptomatic anemia and suspected GI bleed, ? Altered mentation 2. Acute respiratory failure requiring O2 supplementation 3. Acute on chronic diastolic CHF 4. COPD 5. Paroxysmal atrial fibrillation 6. CKD stage 3b  Patient seen and discussed with Dr. Johney Frame, attending note to follow. Suspect primary issue is symptomatic anemia in the context of gradual failure to thrive over the last several months. Per discussion with primary team, pacemaker interrogation unrevealing except 25% battery -> pacer is known to be nearing ERI and he has follow-up in January for this. I cannot find a compelling reason that we need to continue Plavix. Prior entry of CAD in chart appeared to be a presumptive diagnosis based on prior defect on nuc in 2015. No prior cath. Repeat stress test 07/2019 was low risk with wall motion abnormality felt to correlate with dyssynchrony due to RV pacing. He had minimally elevated troponin in 05/2020. So  while possibility of CAD certainly remains, the risk now appears to outweigh the benefit. Regarding anticoagulation with Xarelto he is on this for both recurrent DVT and atrial fib per notes. He seems to be a marginal candidate at best from an afib standpoint given his recurrent falls with prior rib fractures, compression fractures, and  hip fractures. As such he would also seem to be a poor watchman candidate. Fortunately he is in NSR on amiodarone. I will review anticoagulation recommendations with MD. Will follow clinical response with dose of IV Lasix given in ED. May be component of high output heart failure exacerbated by acute anemia. We will await echocardiogram to assess LV function. Would not be surprised to see LV dysfunction in the context of this tremendous physiologic stress. I did relay updated mental status to attending (patient moaning and unable to meaningfully answer questions including when using loud voice). I will d/c simvastatin given interaction with amiodarone limiting dose options. Can consider alternative statin in AM when more alert. Cardiology will follow again in AM as well.   Risk Assessment/Risk Scores:     HEAR Score (for undifferentiated chest pain):   { Unable to reliably address given pt's mental status  New York Heart Association (NYHA) Functional Class Unable to reliably address given pt's mental status   CHA2DS2-VASc Score = 8   This indicates a 10.8% annual risk of stroke. The patient's score is based upon: CHF History: 1 HTN History: 1 Diabetes History: 1 Stroke History: 2 Vascular Disease History: 1 (aortic plaque) Age Score: 2 Gender Score: 0       For questions or updates, please contact [facility] Please consult [email or web address] for contact info under    Signed, Charlie Pitter, PA-C  01/08/2021 4:39 PM  Patient seen and examined and agree with Melina Copa, PA-C as detailed above.   In brief, the patient is a 87 y.o. male with a hx of CHB  s/p PPM known to be nearing ERI, paroxysmal atrial fibrillation requiring remote DCCV/amiodarone, recurrent DVT, CKD stage 3, chronic diastolic CHF, mild AI, HTN, HLD, tobacco abuse, COPD who presented with reported SOB and fall (? Syncope). Cardiology is consulted for possible syncope/CHF.  Currently, patient is very confused and ripping at lines. He is not able to provide any history. Per review of the record, he was found in the home after a fall in the home with concern for syncope. Notably has been steadily declining. In the ER, he was found to be anemic with hemoglobin 6, Cr 1.45, BNP 624. PPM interrogation without acute events except 25% battery life. CTA chest without PE, mild pulmonary edema. He was given lasix 40mg  IV. Last TTE  08/2019 with LVEF 60-65%, G1DD, normal RV, no significant valve disease. Repeat pending.  Of note, patient was started on plavix for chest pain and elevated troponin in 05/2020. CTA at that time was performed which did not show dissection but noted to have significant coronary calcification. No ischemic testing done at that time but myoview in 2021 without ischemia on infarction. Per our review of the record, he does not appear to have a history of MI or prior PCI.   Overall, patient has been generally declining. Suspect anemia is contributing significantly to his shortness of breath and may have been orthostatic at home increasing the risk of falling. Will follow-up echo, however, the patient is not a candidate for invasive procedures and will be managed medically. Agree that plavix is not indicated in him at this time and I would consider stopping AC all together given frequent falls and profound anemia on admission. Would also consider palliative care consultation during this admission to discuss Pierz.  GEN: Elderly, thin male, combative and ripping at lines Neck: Unable to assess JVD as patient will not comply Cardiac: RRR, no murmurs, rubs, or gallops.  Respiratory:  Rhonchorous GI: Soft, nontender, non-distended  MS: Thin, no edema Neuro:  Confused; not redirectable Psych: Agitated  Plan: -Do not think patients fall +/- syncope is cardiac in nature -PPM interrogation without arrhythmias and low suspicion for ischemia -Management of anemia per primary as likely a big driver of symptoms -Stop plavix as too high risk in this patient -Hold heparin for now; will discuss discontinuing AC all together given current anemia and very frequent falls at home as patient is significant bleed risk -S/p lasix in the ER, will follow-up output -Continue amiodarone for Afib; currently in rhythm -Will change simvastatin to crestor 20mg  daily  -Follow-up TTE -Overall, patient has been generally declining. Recommend palliative care consultation to discuss goals of care going forward  Gwyndolyn Kaufman, MD

## 2021-01-15 NOTE — Telephone Encounter (Signed)
Abbott alert, device reached ERI 12/15 Route to triage LR  Upon chart review patient is currently admitted to the ED for syncopal episode and volume overload. Will forward to scheduling and Keitha Butte, RN for needed appointment to discuss gen change once patient is discharged.

## 2021-01-15 NOTE — ED Notes (Signed)
Echo at bedside

## 2021-01-15 NOTE — Progress Notes (Signed)
Called to patients room pt having desating issues.  Patient is currently in restraints BIPAP not a option at this time.  Tried high flow salter pts sats continued to drop.  Pt placed on a NRB mask SP02 increased.  Placed patient on a heated high flow 15 liters and 100%.  ABG obtained and reported to the rapid response RN at bedside. Ph 7.219 C02 83.5 S02 59 Bicarb 32.9.  Sp02 currently 85%.

## 2021-01-16 DIAGNOSIS — R55 Syncope and collapse: Secondary | ICD-10-CM | POA: Diagnosis not present

## 2021-01-16 LAB — GLUCOSE, CAPILLARY: Glucose-Capillary: 131 mg/dL — ABNORMAL HIGH (ref 70–99)

## 2021-01-16 LAB — TYPE AND SCREEN
ABO/RH(D): B POS
Antibody Screen: NEGATIVE
Unit division: 0
Unit division: 0

## 2021-01-16 LAB — BPAM RBC
Blood Product Expiration Date: 202301032359
Blood Product Expiration Date: 202301042359
ISSUE DATE / TIME: 202212290512
ISSUE DATE / TIME: 202212290850
Unit Type and Rh: 7300
Unit Type and Rh: 7300

## 2021-01-16 LAB — CBC
HCT: 26.7 % — ABNORMAL LOW (ref 39.0–52.0)
Hemoglobin: 8.3 g/dL — ABNORMAL LOW (ref 13.0–17.0)
MCH: 32 pg (ref 26.0–34.0)
MCHC: 31.1 g/dL (ref 30.0–36.0)
MCV: 103.1 fL — ABNORMAL HIGH (ref 80.0–100.0)
Platelets: 331 10*3/uL (ref 150–400)
RBC: 2.59 MIL/uL — ABNORMAL LOW (ref 4.22–5.81)
RDW: 29.2 % — ABNORMAL HIGH (ref 11.5–15.5)
WBC: 10.9 10*3/uL — ABNORMAL HIGH (ref 4.0–10.5)
nRBC: 0.5 % — ABNORMAL HIGH (ref 0.0–0.2)

## 2021-01-16 LAB — BASIC METABOLIC PANEL
Anion gap: 9 (ref 5–15)
BUN: 15 mg/dL (ref 8–23)
CO2: 30 mmol/L (ref 22–32)
Calcium: 8.2 mg/dL — ABNORMAL LOW (ref 8.9–10.3)
Chloride: 103 mmol/L (ref 98–111)
Creatinine, Ser: 1.35 mg/dL — ABNORMAL HIGH (ref 0.61–1.24)
GFR, Estimated: 51 mL/min — ABNORMAL LOW (ref >60)
Glucose, Bld: 134 mg/dL — ABNORMAL HIGH (ref 70–99)
Potassium: 3.8 mmol/L (ref 3.5–5.1)
Sodium: 142 mmol/L (ref 135–145)

## 2021-01-16 MED ORDER — ACETAMINOPHEN 650 MG RE SUPP
650.0000 mg | Freq: Four times a day (QID) | RECTAL | Status: DC | PRN
  Administered 2021-01-16: 02:00:00 650 mg via RECTAL
  Filled 2021-01-16: qty 1

## 2021-01-18 NOTE — Significant Event (Signed)
CCMD Patient HR dropped to 30s then 20s checked patient there was no pulse and respirations. Wife at the bedside and did not want any code blue to be called. Called RRT and MD. 2 nurses and Dr. Eliseo Squires pronounced.

## 2021-01-18 NOTE — Death Summary Note (Signed)
DEATH SUMMARY   Patient Details  Name: Lance Smith MRN: [id number] DOB: 04/22/1931 [id number], Lance Lovett, MD  Admission/Discharge Information   Admit Date:  02-10-21  Date of Death: Date of Death: 02/11/2021  Time of Death: Time of Death: 0810  Length of Stay: 1   Principle Cause of death: heart failure  Hospital Diagnoses: Principal Problem:   Syncope and collapse Active Problems:   Complete heart block (HCC)   Tobacco abuse   Pacemaker-[employer]   Atrial fibrillation (HCC)   CAD (coronary artery disease),Prior IMI    DVT, lower extremity, recurrent (HCC)   Type 2 diabetes mellitus without complication (HCC)   Acute on chronic diastolic CHF (congestive heart failure) ([place])   Symptomatic anemia   Acute respiratory failure with hypoxia [facility])   Hospital Course: Initially admitted with syncope thought due to anemia but de-compensated overnight with worsening heart failure.   Overnight an ABG obtained, pH 7.219, PCO2 of 83.5, PaO2 of 59 on nonrebreather mask.  Chest x-ray obtained revealing progressively worsening substantial pulmonary edema. Patient was not a BiPAP candidate due to severe lethargy and minimal responsiveness.  Given IV lasix with minimal improvement.  Prognosis discussed with family and patient made DNR.  He continued to worsen in the AM on 2023-02-12 and was pronounced dead at 8:10 AM  Consultations: cards  The results of significant diagnostics from this hospitalization (including imaging, microbiology, ancillary and laboratory) are listed below for reference.   Significant Diagnostic Studies: CT Head Wo Contrast  Result Date: 02-10-2021 CLINICAL DATA:  88 year old male with history of near syncopal event. Head trauma. On Xarelto. EXAM: CT HEAD WITHOUT CONTRAST TECHNIQUE: Contiguous axial images were obtained from the base of the skull through the vertex without intravenous contrast. COMPARISON:  No priors. FINDINGS: Brain: Moderate cerebral atrophy. Patchy  and confluent areas of decreased attenuation are noted throughout the deep and periventricular white matter of the cerebral hemispheres bilaterally, compatible with chronic microvascular ischemic disease. No evidence of acute infarction, hemorrhage, hydrocephalus, extra-axial collection or mass lesion/mass effect. Vascular: No hyperdense vessel or unexpected calcification. Skull: Normal. Negative for fracture or focal lesion. Sinuses/Orbits: No acute finding. Other: None. IMPRESSION: 1. No acute intracranial abnormalities. 2. Moderate cerebral atrophy with chronic microvascular ischemic changes in the cerebral white matter, as above. Electronically Signed   By: Vinnie Langton M.D.   On: 2021-02-10 04:55   CT Angio Chest PE W and/or Wo Contrast  Result Date: 02/10/21 CLINICAL DATA:  88 year old male with history of near syncopal event in the bathroom. Suspected pulmonary embolism. Suspected retroperitoneal bleed. EXAM: CT ANGIOGRAPHY CHEST, ABDOMEN AND PELVIS TECHNIQUE: Multidetector CT imaging through the chest, abdomen and pelvis was performed using the standard protocol during bolus administration of intravenous contrast. Multiplanar reconstructed images and MIPs were obtained and reviewed to evaluate the vascular anatomy. CONTRAST:  48mL OMNIPAQUE IOHEXOL 350 MG/ML SOLN COMPARISON:  Chest CTA 06/04/2020. CT the abdomen and pelvis 07/22/2016. FINDINGS: CTA CHEST FINDINGS Cardiovascular: No filling defects within the pulmonary arterial tree to suggest pulmonary embolism. Heart size is enlarged with left ventricular dilatation. There is no significant pericardial fluid, thickening or pericardial calcification. There is aortic atherosclerosis, as well as atherosclerosis of the great vessels of the mediastinum and the coronary arteries, including calcified atherosclerotic plaque in the left main, left anterior descending and right coronary arteries. Severe calcifications of the mitral annulus. Mild  calcifications of the aortic valve. Left-sided pacemaker/AICD with lead tips terminating in the right atrium and right ventricular apex. Mediastinum/Nodes:  No pathologically enlarged mediastinal or hilar lymph nodes. Hilar esophagus is unremarkable in appearance. No axillary lymphadenopathy. Lungs/Pleura: Moderate bilateral pleural effusions lying dependently with areas of passive subsegmental atelectasis in the dependent portions of the lungs bilaterally, most severe in the left lower lobe. Patchy areas of mild ground-glass attenuation and interlobular septal thickening are noted in the lungs suggesting very mild interstitial pulmonary edema. Mild diffuse bronchial wall thickening with mild centrilobular and paraseptal emphysema. Musculoskeletal: Compression fractures of inferior endplate of T5 and superior endplate of U04 with approximately 20% loss of height at both levels, new compared to the prior study from May 2022, but nonacute in appearance. There are no aggressive appearing lytic or blastic lesions noted in the visualized portions of the skeleton. Review of the MIP images confirms the above findings. CTA ABDOMEN AND PELVIS FINDINGS VASCULAR Aorta: Extensive atherosclerosis throughout the abdominal aorta. Normal caliber aorta without aneurysm, dissection, vasculitis or significant stenosis. Celiac: Patent without evidence of aneurysm, dissection, vasculitis or significant stenosis. SMA: Atherosclerosis. Patent without evidence of aneurysm, dissection, vasculitis or significant stenosis. Renals: Both renal arteries are patent without evidence of aneurysm, dissection, vasculitis, fibromuscular dysplasia or significant stenosis. IMA: Patent without evidence of aneurysm, dissection, vasculitis or significant stenosis. Inflow: Patent without evidence of aneurysm, dissection, vasculitis or significant stenosis. Veins: No obvious venous abnormality within the limitations of this arterial phase study. Review of the  MIP images confirms the above findings. NON-VASCULAR Hepatobiliary: No suspicious cystic or solid hepatic lesions. No intra or extrahepatic biliary ductal dilatation. Gallbladder is normal in appearance. Pancreas: No pancreatic mass. No pancreatic ductal dilatation. No pancreatic or peripancreatic fluid collections or inflammatory changes. Spleen: Unremarkable. Adrenals/Urinary Tract: Low-attenuation lesions in both kidneys, compatible with simple cysts, largest of which is exophytic extending laterally from the upper pole of the right kidney measuring 3.2 x 2.2 cm (axial image 83 of series 8). Other subcentimeter low-attenuation lesions in the kidneys are too small to definitively characterize, but are statistically likely to represent tiny cysts. Left adrenal gland is normal. There is a partially calcified right adrenal nodule measuring 2.6 x 2.2 cm (axial image 57 of series 8) which is incompletely characterized on today's examination, but stable compared to numerous prior studies dating back to 2013, presumably a benign lesions such as an adenoma. No hydroureteronephrosis. Urinary bladder is normal in appearance. Stomach/Bowel: The appearance of the stomach is normal. No pathologic dilatation of small bowel or colon. Numerous colonic diverticulae are noted, without surrounding inflammatory changes to suggest an acute diverticulitis at this time. Normal appendix. Lymphatic: No lymphadenopathy noted in the abdomen or pelvis. Reproductive: Prostate gland and seminal vesicles are unremarkable in appearance. Other: Small umbilical hernia containing only omental fat. No high attenuation fluid collection within the peritoneal cavity or retroperitoneum to suggest significant hemorrhage. No significant volume of ascites. No pneumoperitoneum. Musculoskeletal: Status post right hip arthroplasty. There are no aggressive appearing lytic or blastic lesions noted in the visualized portions of the skeleton. Review of the MIP  images confirms the above findings. IMPRESSION: 1. No evidence of pulmonary embolism. 2. No retroperitoneal or intraperitoneal hemorrhage. 3. No acute abnormality in the abdomen or pelvis to account for the patient's symptoms. 4. Cardiomegaly with left ventricular dilatation. There is also evidence of mild interstitial pulmonary edema in the lungs and moderate bilateral pleural effusions; imaging findings concerning for potential congestive heart failure, as above 5. Colonic diverticulosis without evidence of acute diverticulitis at this time. 6. Aortic atherosclerosis, in addition to left main and  three-vessel coronary artery disease. 7. New but nonacute compression fractures of T5 and T11 with 20% loss of height at both levels. 8. Additional incidental findings, as above. Electronically Signed   By: Vinnie Langton M.D.   On: 12/24/2020 05:34   DG Chest Port 1 View  Result Date: 01/04/2021 CLINICAL DATA:  Acute respiratory distress EXAM: PORTABLE CHEST 1 VIEW COMPARISON:  01/07/2021 FINDINGS: Single frontal view of the chest demonstrates stable dual lead pacer. Cardiac silhouette is stable, with extensive calcification of the mitral annulus again noted. There is persistent central vascular congestion, with worsening bibasilar consolidation and effusions. No acute bony abnormalities. IMPRESSION: 1. Worsening congestive heart failure. Electronically Signed   By: Randa Ngo M.D.   On: 01/09/2021 23:01   DG Chest Port 1 View  Result Date: 01/05/2021 CLINICAL DATA:  Syncope.  Shortness of breath. EXAM: PORTABLE CHEST 1 VIEW COMPARISON:  Chest radiograph dated 08/16/2020. FINDINGS: Cardiomegaly with vascular congestion. Small bilateral pleural effusions with bibasilar atelectasis or infiltrate. No pneumothorax. Atherosclerotic calcification of the aorta. Left pectoral pacemaker device. No acute osseous pathology. IMPRESSION: Cardiomegaly with vascular congestion and small bilateral pleural effusions.  Electronically Signed   By: Anner Crete M.D.   On: 12/20/2020 02:45   ECHOCARDIOGRAM COMPLETE  Result Date: 01/13/2021    ECHOCARDIOGRAM REPORT   Patient Name:   ZYIR GASSERT Date of Exam: 01/17/2021 Medical Rec #:  [id number]    Height:       69.0 in Accession #:    [id number]   Weight:       135.0 lb Date of Birth:  [birth year]    BSA:          1.748 m Patient Age:    64 years     BP:           97/76 mmHg Patient Gender: M            HR:           57 bpm. Exam Location:  Inpatient Procedure: 2D Echo, Cardiac Doppler, Color Doppler and Intracardiac            Opacification Agent Indications:    Syncope  History:        Patient has prior history of Echocardiogram examinations, most                 recent 08/27/2019. CHF, Previous Myocardial Infarction and CAD,                 COPD; Risk Factors:Diabetes, Dyslipidemia and Former Smoker.  Sonographer:    Merrie Roof RDCS Referring Phys: [id number] [place]  1. Left ventricular ejection fraction, by estimation, is 50 to 55%. The left ventricle has low normal function. The left ventricle demonstrates regional wall motion abnormalities (see scoring diagram/findings for description). Left ventricular diastolic  parameters are consistent with Grade II diastolic dysfunction (pseudonormalization). Elevated left ventricular end-diastolic pressure.  2. Right ventricular systolic function is normal. The right ventricular size is normal. There is moderately elevated pulmonary artery systolic pressure.  3. Left atrial size was severely dilated.  4. Right atrial size was severely dilated.  5. The mitral valve is degenerative. Mild mitral valve regurgitation. No evidence of mitral stenosis. Moderate mitral annular calcification.  6. The aortic valve is tricuspid. There is mild calcification of the aortic valve. There is mild thickening of the aortic valve. Aortic valve regurgitation is not visualized. Aortic valve sclerosis is present, with no evidence of  aortic valve stenosis. Aortic valve area, by VTI measures 2.06 cm. Aortic valve mean gradient measures 7.0 mmHg. Aortic valve Vmax measures 1.86 m/s.  7. The inferior vena cava is dilated in size with <50% respiratory variability, suggesting right atrial pressure of 15 mmHg. FINDINGS  Left Ventricle: Left ventricular ejection fraction, by estimation, is 50 to 55%. The left ventricle has low normal function. The left ventricle demonstrates regional wall motion abnormalities. Definity contrast agent was given IV to delineate the left ventricular endocardial borders. The left ventricular internal cavity size was normal in size. There is no left ventricular hypertrophy. Left ventricular diastolic parameters are consistent with Grade II diastolic dysfunction (pseudonormalization). Elevated left ventricular end-diastolic pressure.  LV Wall Scoring: The entire anterior septum, mid inferoseptal segment, and apex are hypokinetic. Right Ventricle: The right ventricular size is normal. No increase in right ventricular wall thickness. Right ventricular systolic function is normal. There is moderately elevated pulmonary artery systolic pressure. The tricuspid regurgitant velocity is 3.21 m/s, and with an assumed right atrial pressure of 15 mmHg, the estimated right ventricular systolic pressure is 93.5 mmHg. Left Atrium: Left atrial size was severely dilated. Right Atrium: Right atrial size was severely dilated. Pericardium: There is no evidence of pericardial effusion. Mitral Valve: The mitral valve is degenerative in appearance. Moderate mitral annular calcification. Mild mitral valve regurgitation. No evidence of mitral valve stenosis. Tricuspid Valve: The tricuspid valve is normal in structure. Tricuspid valve regurgitation is trivial. No evidence of tricuspid stenosis. Aortic Valve: The aortic valve is tricuspid. There is mild calcification of the aortic valve. There is mild thickening of the aortic valve. Aortic valve  regurgitation is not visualized. Aortic valve sclerosis is present, with no evidence of aortic valve stenosis. Aortic valve mean gradient measures 7.0 mmHg. Aortic valve peak gradient measures 13.8 mmHg. Aortic valve area, by VTI measures 2.06 cm. Pulmonic Valve: The pulmonic valve was normal in structure. Pulmonic valve regurgitation is not visualized. No evidence of pulmonic stenosis. Aorta: The aortic root is normal in size and structure. Venous: The inferior vena cava is dilated in size with less than 50% respiratory variability, suggesting right atrial pressure of 15 mmHg. IAS/Shunts: No atrial level shunt detected by color flow Doppler.  LEFT VENTRICLE PLAX 2D LVIDd:         5.20 cm   Diastology LVIDs:         3.30 cm   LV e' medial:   5.40 cm/s LV PW:         1.30 cm   LV E/e' medial: 23.3 LV IVS:        1.00 cm LVOT diam:     2.00 cm LV SV:         76 LV SV Index:   43 LVOT Area:     3.14 cm  RIGHT VENTRICLE          IVC RV Basal diam:  3.90 cm  IVC diam: 2.50 cm LEFT ATRIUM              Index        RIGHT ATRIUM           Index LA diam:        4.70 cm  2.69 cm/m   RA Area:     24.50 cm LA Vol (A2C):   124.0 ml 70.93 ml/m  RA Volume:   74.70 ml  42.73 ml/m LA Vol (A4C):   85.0 ml  48.62 ml/m LA Biplane Vol: 104.0  ml 59.49 ml/m  AORTIC VALVE AV Area (Vmax):    1.87 cm AV Area (Vmean):   1.89 cm AV Area (VTI):     2.06 cm AV Vmax:           186.00 cm/s AV Vmean:          122.000 cm/s AV VTI:            0.367 m AV Peak Grad:      13.8 mmHg AV Mean Grad:      7.0 mmHg LVOT Vmax:         111.00 cm/s LVOT Vmean:        73.400 cm/s LVOT VTI:          0.241 m LVOT/AV VTI ratio: 0.66  AORTA Ao Root diam: 3.40 cm MITRAL VALVE                TRICUSPID VALVE MV Area (PHT): 2.83 cm     TR Peak grad:   41.2 mmHg MV Decel Time: 268 msec     TR Vmax:        321.00 cm/s MV E velocity: 126.00 cm/s MV A velocity: 118.00 cm/s  SHUNTS MV E/A ratio:  1.07         Systemic VTI:  0.24 m                              Systemic Diam: 2.00 cm Skeet Latch MD Electronically signed by Skeet Latch MD Signature Date/Time: 01/14/2021/5:43:34 PM    Final    CT Angio Abd/Pel W and/or Wo Contrast  Result Date: 12/27/2020 CLINICAL DATA:  88 year old male with history of near syncopal event in the bathroom. Suspected pulmonary embolism. Suspected retroperitoneal bleed. EXAM: CT ANGIOGRAPHY CHEST, ABDOMEN AND PELVIS TECHNIQUE: Multidetector CT imaging through the chest, abdomen and pelvis was performed using the standard protocol during bolus administration of intravenous contrast. Multiplanar reconstructed images and MIPs were obtained and reviewed to evaluate the vascular anatomy. CONTRAST:  56mL OMNIPAQUE IOHEXOL 350 MG/ML SOLN COMPARISON:  Chest CTA 06/04/2020. CT the abdomen and pelvis 07/22/2016. FINDINGS: CTA CHEST FINDINGS Cardiovascular: No filling defects within the pulmonary arterial tree to suggest pulmonary embolism. Heart size is enlarged with left ventricular dilatation. There is no significant pericardial fluid, thickening or pericardial calcification. There is aortic atherosclerosis, as well as atherosclerosis of the great vessels of the mediastinum and the coronary arteries, including calcified atherosclerotic plaque in the left main, left anterior descending and right coronary arteries. Severe calcifications of the mitral annulus. Mild calcifications of the aortic valve. Left-sided pacemaker/AICD with lead tips terminating in the right atrium and right ventricular apex. Mediastinum/Nodes: No pathologically enlarged mediastinal or hilar lymph nodes. Hilar esophagus is unremarkable in appearance. No axillary lymphadenopathy. Lungs/Pleura: Moderate bilateral pleural effusions lying dependently with areas of passive subsegmental atelectasis in the dependent portions of the lungs bilaterally, most severe in the left lower lobe. Patchy areas of mild ground-glass attenuation and interlobular septal thickening are  noted in the lungs suggesting very mild interstitial pulmonary edema. Mild diffuse bronchial wall thickening with mild centrilobular and paraseptal emphysema. Musculoskeletal: Compression fractures of inferior endplate of T5 and superior endplate of J94 with approximately 20% loss of height at both levels, new compared to the prior study from May 2022, but nonacute in appearance. There are no aggressive appearing lytic or blastic lesions noted in the visualized portions of the skeleton. Review of the MIP  images confirms the above findings. CTA ABDOMEN AND PELVIS FINDINGS VASCULAR Aorta: Extensive atherosclerosis throughout the abdominal aorta. Normal caliber aorta without aneurysm, dissection, vasculitis or significant stenosis. Celiac: Patent without evidence of aneurysm, dissection, vasculitis or significant stenosis. SMA: Atherosclerosis. Patent without evidence of aneurysm, dissection, vasculitis or significant stenosis. Renals: Both renal arteries are patent without evidence of aneurysm, dissection, vasculitis, fibromuscular dysplasia or significant stenosis. IMA: Patent without evidence of aneurysm, dissection, vasculitis or significant stenosis. Inflow: Patent without evidence of aneurysm, dissection, vasculitis or significant stenosis. Veins: No obvious venous abnormality within the limitations of this arterial phase study. Review of the MIP images confirms the above findings. NON-VASCULAR Hepatobiliary: No suspicious cystic or solid hepatic lesions. No intra or extrahepatic biliary ductal dilatation. Gallbladder is normal in appearance. Pancreas: No pancreatic mass. No pancreatic ductal dilatation. No pancreatic or peripancreatic fluid collections or inflammatory changes. Spleen: Unremarkable. Adrenals/Urinary Tract: Low-attenuation lesions in both kidneys, compatible with simple cysts, largest of which is exophytic extending laterally from the upper pole of the right kidney measuring 3.2 x 2.2 cm (axial  image 83 of series 8). Other subcentimeter low-attenuation lesions in the kidneys are too small to definitively characterize, but are statistically likely to represent tiny cysts. Left adrenal gland is normal. There is a partially calcified right adrenal nodule measuring 2.6 x 2.2 cm (axial image 57 of series 8) which is incompletely characterized on today's examination, but stable compared to numerous prior studies dating back to 2013, presumably a benign lesions such as an adenoma. No hydroureteronephrosis. Urinary bladder is normal in appearance. Stomach/Bowel: The appearance of the stomach is normal. No pathologic dilatation of small bowel or colon. Numerous colonic diverticulae are noted, without surrounding inflammatory changes to suggest an acute diverticulitis at this time. Normal appendix. Lymphatic: No lymphadenopathy noted in the abdomen or pelvis. Reproductive: Prostate gland and seminal vesicles are unremarkable in appearance. Other: Small umbilical hernia containing only omental fat. No high attenuation fluid collection within the peritoneal cavity or retroperitoneum to suggest significant hemorrhage. No significant volume of ascites. No pneumoperitoneum. Musculoskeletal: Status post right hip arthroplasty. There are no aggressive appearing lytic or blastic lesions noted in the visualized portions of the skeleton. Review of the MIP images confirms the above findings. IMPRESSION: 1. No evidence of pulmonary embolism. 2. No retroperitoneal or intraperitoneal hemorrhage. 3. No acute abnormality in the abdomen or pelvis to account for the patient's symptoms. 4. Cardiomegaly with left ventricular dilatation. There is also evidence of mild interstitial pulmonary edema in the lungs and moderate bilateral pleural effusions; imaging findings concerning for potential congestive heart failure, as above 5. Colonic diverticulosis without evidence of acute diverticulitis at this time. 6. Aortic atherosclerosis, in  addition to left main and three-vessel coronary artery disease. 7. New but nonacute compression fractures of T5 and T11 with 20% loss of height at both levels. 8. Additional incidental findings, as above. Electronically Signed   By: Vinnie Langton M.D.   On: 01/07/2021 05:34    Microbiology: Recent Results (from the past 240 hour(s))  Resp Panel by RT-PCR (Flu A&B, Covid) Nasopharyngeal Swab     Status: None   Collection Time: 01/14/2021  3:27 AM   Specimen: Nasopharyngeal Swab; Nasopharyngeal(NP) swabs in vial transport medium  Result Value Ref Range Status   SARS Coronavirus 2 by RT PCR NEGATIVE NEGATIVE Final    Comment: (NOTE) SARS-CoV-2 target nucleic acids are NOT DETECTED.  The SARS-CoV-2 RNA is generally detectable in upper respiratory specimens during the acute phase of  infection. The lowest concentration of SARS-CoV-2 viral copies this assay can detect is 138 copies/mL. A negative result does not preclude SARS-Cov-2 infection and should not be used as the sole basis for treatment or other patient management decisions. A negative result may occur with  improper specimen collection/handling, submission of specimen other than nasopharyngeal swab, presence of viral mutation(s) within the areas targeted by this assay, and inadequate number of viral copies(<138 copies/mL). A negative result must be combined with clinical observations, patient history, and epidemiological information. The expected result is Negative.  Fact Sheet for Patients:  EntrepreneurPulse.com.au  Fact Sheet for Healthcare Providers:  IncredibleEmployment.be  This test is no t yet approved or cleared by the [place] FDA and  has been authorized for detection and/or diagnosis of SARS-CoV-2 by FDA under an Emergency Use Authorization (EUA). This EUA will remain  in effect (meaning this test can be used) for the duration of the COVID-19 declaration under Section  564(b)(1) of the Act, 21 U.S.C.section 360bbb-3(b)(1), unless the authorization is terminated  or revoked sooner.       Influenza A by PCR NEGATIVE NEGATIVE Final   Influenza B by PCR NEGATIVE NEGATIVE Final    Comment: (NOTE) The Xpert Xpress SARS-CoV-2/FLU/RSV plus assay is intended as an aid in the diagnosis of influenza from Nasopharyngeal swab specimens and should not be used as a sole basis for treatment. Nasal washings and aspirates are unacceptable for Xpert Xpress SARS-CoV-2/FLU/RSV testing.  Fact Sheet for Patients: EntrepreneurPulse.com.au  Fact Sheet for Healthcare Providers: IncredibleEmployment.be  This test is not yet approved or cleared by the [place] FDA and has been authorized for detection and/or diagnosis of SARS-CoV-2 by FDA under an Emergency Use Authorization (EUA). This EUA will remain in effect (meaning this test can be used) for the duration of the COVID-19 declaration under Section 564(b)(1) of the Act, 21 U.S.C. section 360bbb-3(b)(1), unless the authorization is terminated or revoked.  Performed at [facility], [place] [address] [place], [place] [address]     Time spent: 45 minutes  Signed: Geradine Girt 2021-01-25

## 2021-01-18 NOTE — Progress Notes (Signed)
Called by nursing to the bedside as patient's HR was dropping.  When I arrived patient had no HR nor respirations.  Wife at bedside.  Comfort provided.  Time of death: 8:10 AM Eulogio Bear DO

## 2021-01-18 NOTE — Progress Notes (Signed)
Called to patients room for nasotracheal suctioning.  Suctioned patient and retrieved a moderate amount of secretions.  Patients sats increased to 94% now on NRB until a continuous acceptable SP02 is seen.

## 2021-01-18 NOTE — Progress Notes (Signed)
See associated rapid response and physician notes for greater detail  01-31-2021 0415  Assess: MEWS Score  Level of Consciousness Responds to Pain  O2 Device HFNC  O2 Flow Rate (L/min) 4 L/min  Assess: MEWS Score  MEWS Temp 0  MEWS Systolic 0  MEWS Pulse 0  MEWS RR 2  MEWS LOC 2  MEWS Score 4  MEWS Score Color Red  Assess: if the MEWS score is Yellow or Red  Were vital signs taken at a resting state? Yes  Focused Assessment No change from prior assessment  Early Detection of Sepsis Score *See Row Information* Low  MEWS guidelines implemented *See Row Information* No, other (Comment)  Treat  MEWS Interventions Administered prn meds/treatments;Escalated (See documentation below);Consulted Respiratory Therapy  Escalate  MEWS: Escalate Red: discuss with charge nurse/RN and provider, consider discussing with RRT  Notify: Charge Nurse/RN  Name of Charge Nurse/RN Notified [place] RN  Date Charge Nurse/RN Notified 01/09/2021  Time Charge Nurse/RN Notified 2130  Notify: Provider  Provider Name/Title Shalhoub MD  Date Provider Notified 12/18/2020  Notification Type Page  Notification Reason Other (Comment) (arrived to unit critical)  Provider response At bedside  Date of Provider Response 01/04/2021  Notify: Rapid Response  Name of Rapid Response RN Notified Mindy RN  Date Rapid Response Notified 12/18/2020  Document  Patient Outcome Not stable and remains on department  Progress note created (see row info) Yes

## 2021-01-18 NOTE — Progress Notes (Signed)
Progress Note  Patient Name: Lance Smith Date of Encounter: 02-11-21  [facility] Cardiologist: Caryl Comes  Subjective   Rapid response called overnight due to respiratory distress; ABG hypoxia with resp acidosis.  CXR with pulm edema-->lasix 40mg  IV x 1.  This AM the patient is lethargic and not responsive to questions or sternal rub.   Inpatient Medications    Scheduled Meds:  albuterol       amiodarone  200 mg Oral Daily   calcitonin (salmon)  1 spray Alternating Nares Daily   feeding supplement (GLUCERNA SHAKE)  237 mL Oral TID BM   furosemide  40 mg Intravenous BID   insulin aspart  0-9 Units Subcutaneous TID WC   lidocaine  2 patch Transdermal Q24H   pantoprazole (PROTONIX) IV  40 mg Intravenous Q24H   rosuvastatin  20 mg Oral Daily   sodium chloride flush  3 mL Intravenous Q12H   tamsulosin  0.4 mg Oral Daily   Continuous Infusions:  PRN Meds: acetaminophen, LORazepam   Vital Signs    Vitals:   01/02/2021 2119 12/28/2020 2310 11-Feb-2021 0012 February 11, 2021 0607  BP: 105/62 101/88 103/84 (!) 114/55  Pulse:  83 80 74  Resp:  (!) 26 (!) 27 19  Temp: 97.8 F (36.6 C)  (!) 97.5 F (36.4 C) 98.7 F (37.1 C)  TempSrc: Oral   Axillary  SpO2:  (!) 77% (!) 89% 95%  Weight:      Height:        Intake/Output Summary (Last 24 hours) at February 11, 2021 0647 Last data filed at February 11, 2021 0500 Gross per 24 hour  Intake 582.5 ml  Output 1500 ml  Net -917.5 ml   Last 3 Weights 12/22/2020 08/16/2020 08/15/2020  Weight (lbs) 135 lb 151 lb 3.8 oz 148 lb 2.4 oz  Weight (kg) 61.236 kg 68.6 kg 67.2 kg      Telemetry    Paced - Personally Reviewed  ECG    Not done- Personally Reviewed  Physical Exam   GEN: Lethargic Neck: No JVD Cardiac: RRR, no murmurs, rubs, or gallops.  Respiratory: Decreased BS at bases. GI: Soft, nontender, non-distended  MS: No edema; No deformity. Neuro:  Nonfocal  Psych: Normal affect   Labs    High Sensitivity Troponin:  No results for  input(s): TROPONINIHS in the last 720 hours.   Chemistry Recent Labs  Lab 01/12/2021 0209 12/22/2020 0234  NA 140 140  K 3.6 3.6  CL 104  --   CO2 28  --   GLUCOSE 155*  --   BUN 20  --   CREATININE 1.45*  --   CALCIUM 8.5*  --   PROT 5.7*  --   ALBUMIN 2.6*  --   AST 12*  --   ALT 9  --   ALKPHOS 139*  --   BILITOT 0.5  --   GFRNONAA 47*  --   ANIONGAP 8  --     Lipids No results for input(s): CHOL, TRIG, HDL, LABVLDL, LDLCALC, CHOLHDL in the last 168 hours.  Hematology Recent Labs  Lab 01/07/2021 0209 01/05/2021 0234 February 11, 2021 0327  WBC 10.2  --  10.9*  RBC 1.79*  --  2.59*  HGB 6.0* 6.1* 8.3*  HCT 20.6* 18.0* 26.7*  MCV 115.1*  --  103.1*  MCH 33.5  --  32.0  MCHC 29.1*  --  31.1  RDW 24.7*  --  29.2*  PLT 368  --  331   Thyroid No results  for input(s): TSH, FREET4 in the last 168 hours.  BNP Recent Labs  Lab 12/29/2020 0209  BNP 624.7*    DDimer  Recent Labs  Lab 01/06/2021 0209  DDIMER 2.64*     Radiology    CT Head Wo Contrast  Result Date: 01/06/2021 CLINICAL DATA:  88 year old male with history of near syncopal event. Head trauma. On Xarelto. EXAM: CT HEAD WITHOUT CONTRAST TECHNIQUE: Contiguous axial images were obtained from the base of the skull through the vertex without intravenous contrast. COMPARISON:  No priors. FINDINGS: Brain: Moderate cerebral atrophy. Patchy and confluent areas of decreased attenuation are noted throughout the deep and periventricular white matter of the cerebral hemispheres bilaterally, compatible with chronic microvascular ischemic disease. No evidence of acute infarction, hemorrhage, hydrocephalus, extra-axial collection or mass lesion/mass effect. Vascular: No hyperdense vessel or unexpected calcification. Skull: Normal. Negative for fracture or focal lesion. Sinuses/Orbits: No acute finding. Other: None. IMPRESSION: 1. No acute intracranial abnormalities. 2. Moderate cerebral atrophy with chronic microvascular ischemic changes in  the cerebral white matter, as above. Electronically Signed   By: Vinnie Langton M.D.   On: 12/20/2020 04:55   CT Angio Chest PE W and/or Wo Contrast  Result Date: 12/28/2020 CLINICAL DATA:  88 year old male with history of near syncopal event in the bathroom. Suspected pulmonary embolism. Suspected retroperitoneal bleed. EXAM: CT ANGIOGRAPHY CHEST, ABDOMEN AND PELVIS TECHNIQUE: Multidetector CT imaging through the chest, abdomen and pelvis was performed using the standard protocol during bolus administration of intravenous contrast. Multiplanar reconstructed images and MIPs were obtained and reviewed to evaluate the vascular anatomy. CONTRAST:  22mL OMNIPAQUE IOHEXOL 350 MG/ML SOLN COMPARISON:  Chest CTA 06/04/2020. CT the abdomen and pelvis 07/22/2016. FINDINGS: CTA CHEST FINDINGS Cardiovascular: No filling defects within the pulmonary arterial tree to suggest pulmonary embolism. Heart size is enlarged with left ventricular dilatation. There is no significant pericardial fluid, thickening or pericardial calcification. There is aortic atherosclerosis, as well as atherosclerosis of the great vessels of the mediastinum and the coronary arteries, including calcified atherosclerotic plaque in the left main, left anterior descending and right coronary arteries. Severe calcifications of the mitral annulus. Mild calcifications of the aortic valve. Left-sided pacemaker/AICD with lead tips terminating in the right atrium and right ventricular apex. Mediastinum/Nodes: No pathologically enlarged mediastinal or hilar lymph nodes. Hilar esophagus is unremarkable in appearance. No axillary lymphadenopathy. Lungs/Pleura: Moderate bilateral pleural effusions lying dependently with areas of passive subsegmental atelectasis in the dependent portions of the lungs bilaterally, most severe in the left lower lobe. Patchy areas of mild ground-glass attenuation and interlobular septal thickening are noted in the lungs suggesting  very mild interstitial pulmonary edema. Mild diffuse bronchial wall thickening with mild centrilobular and paraseptal emphysema. Musculoskeletal: Compression fractures of inferior endplate of T5 and superior endplate of C16 with approximately 20% loss of height at both levels, new compared to the prior study from May 2022, but nonacute in appearance. There are no aggressive appearing lytic or blastic lesions noted in the visualized portions of the skeleton. Review of the MIP images confirms the above findings. CTA ABDOMEN AND PELVIS FINDINGS VASCULAR Aorta: Extensive atherosclerosis throughout the abdominal aorta. Normal caliber aorta without aneurysm, dissection, vasculitis or significant stenosis. Celiac: Patent without evidence of aneurysm, dissection, vasculitis or significant stenosis. SMA: Atherosclerosis. Patent without evidence of aneurysm, dissection, vasculitis or significant stenosis. Renals: Both renal arteries are patent without evidence of aneurysm, dissection, vasculitis, fibromuscular dysplasia or significant stenosis. IMA: Patent without evidence of aneurysm, dissection, vasculitis or significant stenosis.  Inflow: Patent without evidence of aneurysm, dissection, vasculitis or significant stenosis. Veins: No obvious venous abnormality within the limitations of this arterial phase study. Review of the MIP images confirms the above findings. NON-VASCULAR Hepatobiliary: No suspicious cystic or solid hepatic lesions. No intra or extrahepatic biliary ductal dilatation. Gallbladder is normal in appearance. Pancreas: No pancreatic mass. No pancreatic ductal dilatation. No pancreatic or peripancreatic fluid collections or inflammatory changes. Spleen: Unremarkable. Adrenals/Urinary Tract: Low-attenuation lesions in both kidneys, compatible with simple cysts, largest of which is exophytic extending laterally from the upper pole of the right kidney measuring 3.2 x 2.2 cm (axial image 83 of series 8). Other  subcentimeter low-attenuation lesions in the kidneys are too small to definitively characterize, but are statistically likely to represent tiny cysts. Left adrenal gland is normal. There is a partially calcified right adrenal nodule measuring 2.6 x 2.2 cm (axial image 57 of series 8) which is incompletely characterized on today's examination, but stable compared to numerous prior studies dating back to 2013, presumably a benign lesions such as an adenoma. No hydroureteronephrosis. Urinary bladder is normal in appearance. Stomach/Bowel: The appearance of the stomach is normal. No pathologic dilatation of small bowel or colon. Numerous colonic diverticulae are noted, without surrounding inflammatory changes to suggest an acute diverticulitis at this time. Normal appendix. Lymphatic: No lymphadenopathy noted in the abdomen or pelvis. Reproductive: Prostate gland and seminal vesicles are unremarkable in appearance. Other: Small umbilical hernia containing only omental fat. No high attenuation fluid collection within the peritoneal cavity or retroperitoneum to suggest significant hemorrhage. No significant volume of ascites. No pneumoperitoneum. Musculoskeletal: Status post right hip arthroplasty. There are no aggressive appearing lytic or blastic lesions noted in the visualized portions of the skeleton. Review of the MIP images confirms the above findings. IMPRESSION: 1. No evidence of pulmonary embolism. 2. No retroperitoneal or intraperitoneal hemorrhage. 3. No acute abnormality in the abdomen or pelvis to account for the patient's symptoms. 4. Cardiomegaly with left ventricular dilatation. There is also evidence of mild interstitial pulmonary edema in the lungs and moderate bilateral pleural effusions; imaging findings concerning for potential congestive heart failure, as above 5. Colonic diverticulosis without evidence of acute diverticulitis at this time. 6. Aortic atherosclerosis, in addition to left main and  three-vessel coronary artery disease. 7. New but nonacute compression fractures of T5 and T11 with 20% loss of height at both levels. 8. Additional incidental findings, as above. Electronically Signed   By: Vinnie Langton M.D.   On: 01/04/2021 05:34   DG Chest Port 1 View  Result Date: 01/05/2021 CLINICAL DATA:  Acute respiratory distress EXAM: PORTABLE CHEST 1 VIEW COMPARISON:  01/01/2021 FINDINGS: Single frontal view of the chest demonstrates stable dual lead pacer. Cardiac silhouette is stable, with extensive calcification of the mitral annulus again noted. There is persistent central vascular congestion, with worsening bibasilar consolidation and effusions. No acute bony abnormalities. IMPRESSION: 1. Worsening congestive heart failure. Electronically Signed   By: Randa Ngo M.D.   On: 12/26/2020 23:01   DG Chest Port 1 View  Result Date: 01/04/2021 CLINICAL DATA:  Syncope.  Shortness of breath. EXAM: PORTABLE CHEST 1 VIEW COMPARISON:  Chest radiograph dated 08/16/2020. FINDINGS: Cardiomegaly with vascular congestion. Small bilateral pleural effusions with bibasilar atelectasis or infiltrate. No pneumothorax. Atherosclerotic calcification of the aorta. Left pectoral pacemaker device. No acute osseous pathology. IMPRESSION: Cardiomegaly with vascular congestion and small bilateral pleural effusions. Electronically Signed   By: Anner Crete M.D.   On: 01/02/2021 02:45  ECHOCARDIOGRAM COMPLETE  Result Date: 12/29/2020    ECHOCARDIOGRAM REPORT   Patient Name:   Lance Smith Date of Exam: 12/28/2020 Medical Rec #:  [id number]    Height:       69.0 in Accession #:    [id number]   Weight:       135.0 lb Date of Birth:  1931-08-31    BSA:          1.748 m Patient Age:    52 years     BP:           97/76 mmHg Patient Gender: M            HR:           57 bpm. Exam Location:  Inpatient Procedure: 2D Echo, Cardiac Doppler, Color Doppler and Intracardiac            Opacification Agent Indications:     Syncope  History:        Patient has prior history of Echocardiogram examinations, most                 recent 08/27/2019. CHF, Previous Myocardial Infarction and CAD,                 COPD; Risk Factors:Diabetes, Dyslipidemia and Former Smoker.  Sonographer:    Merrie Roof RDCS Referring Phys: [id number] [place]  1. Left ventricular ejection fraction, by estimation, is 50 to 55%. The left ventricle has low normal function. The left ventricle demonstrates regional wall motion abnormalities (see scoring diagram/findings for description). Left ventricular diastolic  parameters are consistent with Grade II diastolic dysfunction (pseudonormalization). Elevated left ventricular end-diastolic pressure.  2. Right ventricular systolic function is normal. The right ventricular size is normal. There is moderately elevated pulmonary artery systolic pressure.  3. Left atrial size was severely dilated.  4. Right atrial size was severely dilated.  5. The mitral valve is degenerative. Mild mitral valve regurgitation. No evidence of mitral stenosis. Moderate mitral annular calcification.  6. The aortic valve is tricuspid. There is mild calcification of the aortic valve. There is mild thickening of the aortic valve. Aortic valve regurgitation is not visualized. Aortic valve sclerosis is present, with no evidence of aortic valve stenosis. Aortic valve area, by VTI measures 2.06 cm. Aortic valve mean gradient measures 7.0 mmHg. Aortic valve Vmax measures 1.86 m/s.  7. The inferior vena cava is dilated in size with <50% respiratory variability, suggesting right atrial pressure of 15 mmHg. FINDINGS  Left Ventricle: Left ventricular ejection fraction, by estimation, is 50 to 55%. The left ventricle has low normal function. The left ventricle demonstrates regional wall motion abnormalities. Definity contrast agent was given IV to delineate the left ventricular endocardial borders. The left ventricular internal cavity size  was normal in size. There is no left ventricular hypertrophy. Left ventricular diastolic parameters are consistent with Grade II diastolic dysfunction (pseudonormalization). Elevated left ventricular end-diastolic pressure.  LV Wall Scoring: The entire anterior septum, mid inferoseptal segment, and apex are hypokinetic. Right Ventricle: The right ventricular size is normal. No increase in right ventricular wall thickness. Right ventricular systolic function is normal. There is moderately elevated pulmonary artery systolic pressure. The tricuspid regurgitant velocity is 3.21 m/s, and with an assumed right atrial pressure of 15 mmHg, the estimated right ventricular systolic pressure is 09.3 mmHg. Left Atrium: Left atrial size was severely dilated. Right Atrium: Right atrial size was severely dilated. Pericardium: There is no evidence of pericardial  effusion. Mitral Valve: The mitral valve is degenerative in appearance. Moderate mitral annular calcification. Mild mitral valve regurgitation. No evidence of mitral valve stenosis. Tricuspid Valve: The tricuspid valve is normal in structure. Tricuspid valve regurgitation is trivial. No evidence of tricuspid stenosis. Aortic Valve: The aortic valve is tricuspid. There is mild calcification of the aortic valve. There is mild thickening of the aortic valve. Aortic valve regurgitation is not visualized. Aortic valve sclerosis is present, with no evidence of aortic valve stenosis. Aortic valve mean gradient measures 7.0 mmHg. Aortic valve peak gradient measures 13.8 mmHg. Aortic valve area, by VTI measures 2.06 cm. Pulmonic Valve: The pulmonic valve was normal in structure. Pulmonic valve regurgitation is not visualized. No evidence of pulmonic stenosis. Aorta: The aortic root is normal in size and structure. Venous: The inferior vena cava is dilated in size with less than 50% respiratory variability, suggesting right atrial pressure of 15 mmHg. IAS/Shunts: No atrial level  shunt detected by color flow Doppler.  LEFT VENTRICLE PLAX 2D LVIDd:         5.20 cm   Diastology LVIDs:         3.30 cm   LV e' medial:   5.40 cm/s LV PW:         1.30 cm   LV E/e' medial: 23.3 LV IVS:        1.00 cm LVOT diam:     2.00 cm LV SV:         76 LV SV Index:   43 LVOT Area:     3.14 cm  RIGHT VENTRICLE          IVC RV Basal diam:  3.90 cm  IVC diam: 2.50 cm LEFT ATRIUM              Index        RIGHT ATRIUM           Index LA diam:        4.70 cm  2.69 cm/m   RA Area:     24.50 cm LA Vol (A2C):   124.0 ml 70.93 ml/m  RA Volume:   74.70 ml  42.73 ml/m LA Vol (A4C):   85.0 ml  48.62 ml/m LA Biplane Vol: 104.0 ml 59.49 ml/m  AORTIC VALVE AV Area (Vmax):    1.87 cm AV Area (Vmean):   1.89 cm AV Area (VTI):     2.06 cm AV Vmax:           186.00 cm/s AV Vmean:          122.000 cm/s AV VTI:            0.367 m AV Peak Grad:      13.8 mmHg AV Mean Grad:      7.0 mmHg LVOT Vmax:         111.00 cm/s LVOT Vmean:        73.400 cm/s LVOT VTI:          0.241 m LVOT/AV VTI ratio: 0.66  AORTA Ao Root diam: 3.40 cm MITRAL VALVE                TRICUSPID VALVE MV Area (PHT): 2.83 cm     TR Peak grad:   41.2 mmHg MV Decel Time: 268 msec     TR Vmax:        321.00 cm/s MV E velocity: 126.00 cm/s MV A velocity: 118.00 cm/s  SHUNTS MV E/A ratio:  1.07  Systemic VTI:  0.24 m                             Systemic Diam: 2.00 cm Skeet Latch MD Electronically signed by Skeet Latch MD Signature Date/Time: 01/01/2021/5:43:34 PM    Final     Cardiac Studies   Relevant CV Studies: Echo 08/2019  1. Left ventricular ejection fraction, by estimation, is 60 to 65%. The  left ventricle has normal function. The left ventricle has no regional  wall motion abnormalities. The left ventricular internal cavity size was  moderately dilated. There is mild  left ventricular hypertrophy. Left ventricular diastolic parameters are  consistent with Grade I diastolic dysfunction (impaired relaxation).   2. Right  ventricular systolic function is normal. The right ventricular  size is normal.   3. Left atrial size was moderately dilated.   4. The mitral valve is normal in structure. No evidence of mitral valve  regurgitation. No evidence of mitral stenosis.   5. The aortic valve is tricuspid. Aortic valve regurgitation is mild.  Mild to moderate aortic valve sclerosis/calcification is present, without  any evidence of aortic stenosis.   6. The inferior vena cava is normal in size with greater than 50%  respiratory variability, suggesting right atrial pressure of 3 mmHg   07/2019 Nuclear Stress Test   The left ventricular ejection fraction is moderately decreased (30-44%). Nuclear stress EF: 44%. There was no ST segment deviation noted during stress. The study is normal. This is a low risk study.   Low risk stress nuclear study with normal perfusion. Mildly reduced left ventricular global systolic function with profound apex-to base dyssynchrony due to RV pacing.  Patient Profile     88 y.o. male with CHB s/p PPM nearing ERI, PAF previously on A/C, CKD, HTN, HL admitted with syncope and found to be profoundly anemic.    Assessment & Plan     Syncope:  Likely not due to cardiac etiology and likely related to profound anemia.  PPM interrogation without VT/rhythm issues.  Echo shows new anterior WMA abnormality and low normal EF that may represent stress CM given ongoing medical issues.  Given current state and now DNR-DNI, I spoke with patient's family and aggressive measures will be deferred.   Anemia:  Per primary team.  Endoscopy/colonscopy is a relatively low risk procedure from CV perspective and therefore, if indicated, could be performed.  However, given other medical issues, perhaps a conservative approach is best. Acute on chronic diastolic heart failure:  Likely due to RBC transfusion, agree with IV BID lasix, monitor Cr daily. S/p PPM:  Nearing ERI and has follow up with EP. PAF:  Not a  candidate for anticoagulation due to falls and mental status, would consider ASA 325mg  in the future    Failure to thrive:  Overall poor prognosis, consider palliative care consult to address [place].     For questions or updates, please contact [place] Please consult [email or web address] for contact info under        Signed, Early Osmond, MD  2021/01/21, 6:47 AM

## 2021-01-18 DEATH — deceased

## 2021-01-26 ENCOUNTER — Encounter: Payer: Medicare Other | Admitting: Physician Assistant
